# Patient Record
Sex: Female | Born: 1955
Health system: Southern US, Community
[De-identification: ages and names within clinical notes are randomized; demographics above are authoritative.]

## PROBLEM LIST (undated history)

## (undated) ENCOUNTER — Emergency Department (HOSPITAL_COMMUNITY): Admission: EM | Payer: 59

## (undated) DIAGNOSIS — I6381 Other cerebral infarction due to occlusion or stenosis of small artery: Secondary | ICD-10-CM

## (undated) DIAGNOSIS — I639 Cerebral infarction, unspecified: Secondary | ICD-10-CM

## (undated) DIAGNOSIS — M1990 Unspecified osteoarthritis, unspecified site: Secondary | ICD-10-CM

## (undated) DIAGNOSIS — N189 Chronic kidney disease, unspecified: Secondary | ICD-10-CM

## (undated) DIAGNOSIS — K219 Gastro-esophageal reflux disease without esophagitis: Secondary | ICD-10-CM

## (undated) DIAGNOSIS — R7989 Other specified abnormal findings of blood chemistry: Secondary | ICD-10-CM

## (undated) DIAGNOSIS — I10 Essential (primary) hypertension: Secondary | ICD-10-CM

## (undated) DIAGNOSIS — E7800 Pure hypercholesterolemia, unspecified: Secondary | ICD-10-CM

## (undated) DIAGNOSIS — M549 Dorsalgia, unspecified: Secondary | ICD-10-CM

## (undated) DIAGNOSIS — E039 Hypothyroidism, unspecified: Secondary | ICD-10-CM

## (undated) DIAGNOSIS — G8929 Other chronic pain: Secondary | ICD-10-CM

## (undated) HISTORY — PX: ABDOMINAL HYSTERECTOMY: SHX81

## (undated) HISTORY — PX: TONSILLECTOMY: SUR1361

## (undated) HISTORY — PX: JOINT REPLACEMENT: SHX530

## (undated) HISTORY — PX: APPENDECTOMY: SHX54

## (undated) HISTORY — PX: HERNIA REPAIR: SHX51

## (undated) HISTORY — PX: CARDIAC CATHETERIZATION: SHX172

---

## 1997-05-02 ENCOUNTER — Encounter: Admission: RE | Admit: 1997-05-02 | Discharge: 1997-05-02 | Payer: Self-pay | Admitting: Family Medicine

## 1997-05-03 ENCOUNTER — Encounter: Admission: RE | Admit: 1997-05-03 | Discharge: 1997-05-03 | Payer: Self-pay | Admitting: Family Medicine

## 1997-05-28 ENCOUNTER — Ambulatory Visit (HOSPITAL_COMMUNITY): Admission: RE | Admit: 1997-05-28 | Discharge: 1997-05-28 | Payer: Self-pay | Admitting: Interventional Cardiology

## 1997-06-07 ENCOUNTER — Encounter: Admission: RE | Admit: 1997-06-07 | Discharge: 1997-06-07 | Payer: Self-pay | Admitting: Family Medicine

## 1997-06-26 ENCOUNTER — Encounter: Admission: RE | Admit: 1997-06-26 | Discharge: 1997-06-26 | Payer: Self-pay | Admitting: Family Medicine

## 1997-08-08 ENCOUNTER — Encounter: Admission: RE | Admit: 1997-08-08 | Discharge: 1997-08-08 | Payer: Self-pay | Admitting: Family Medicine

## 1997-10-24 ENCOUNTER — Encounter: Payer: Self-pay | Admitting: Internal Medicine

## 1997-10-24 ENCOUNTER — Ambulatory Visit (HOSPITAL_COMMUNITY): Admission: RE | Admit: 1997-10-24 | Discharge: 1997-10-24 | Payer: Self-pay | Admitting: Internal Medicine

## 1998-01-29 ENCOUNTER — Encounter: Admission: RE | Admit: 1998-01-29 | Discharge: 1998-01-29 | Payer: Self-pay | Admitting: Sports Medicine

## 1998-02-07 ENCOUNTER — Encounter: Admission: RE | Admit: 1998-02-07 | Discharge: 1998-02-07 | Payer: Self-pay | Admitting: Family Medicine

## 1998-02-07 ENCOUNTER — Inpatient Hospital Stay (HOSPITAL_COMMUNITY): Admission: AD | Admit: 1998-02-07 | Discharge: 1998-02-11 | Payer: Self-pay | Admitting: *Deleted

## 1998-02-18 ENCOUNTER — Encounter: Admission: RE | Admit: 1998-02-18 | Discharge: 1998-02-18 | Payer: Self-pay | Admitting: Sports Medicine

## 1998-07-13 ENCOUNTER — Emergency Department (HOSPITAL_COMMUNITY): Admission: EM | Admit: 1998-07-13 | Discharge: 1998-07-13 | Payer: Self-pay | Admitting: Emergency Medicine

## 1999-05-06 ENCOUNTER — Ambulatory Visit (HOSPITAL_COMMUNITY): Admission: RE | Admit: 1999-05-06 | Discharge: 1999-05-06 | Payer: Self-pay

## 1999-06-29 ENCOUNTER — Emergency Department (HOSPITAL_COMMUNITY): Admission: EM | Admit: 1999-06-29 | Discharge: 1999-06-29 | Payer: Self-pay | Admitting: Emergency Medicine

## 1999-06-29 ENCOUNTER — Encounter: Payer: Self-pay | Admitting: Emergency Medicine

## 2001-06-21 ENCOUNTER — Encounter: Admission: RE | Admit: 2001-06-21 | Discharge: 2001-07-08 | Payer: Self-pay | Admitting: Occupational Medicine

## 2001-07-05 ENCOUNTER — Ambulatory Visit (HOSPITAL_COMMUNITY): Admission: RE | Admit: 2001-07-05 | Discharge: 2001-07-05 | Payer: Self-pay | Admitting: Family Medicine

## 2001-07-06 ENCOUNTER — Encounter: Admission: RE | Admit: 2001-07-06 | Discharge: 2001-07-06 | Payer: Self-pay | Admitting: Occupational Medicine

## 2001-07-06 ENCOUNTER — Encounter: Payer: Self-pay | Admitting: Occupational Medicine

## 2001-07-10 ENCOUNTER — Encounter: Payer: Self-pay | Admitting: Emergency Medicine

## 2001-07-10 ENCOUNTER — Emergency Department (HOSPITAL_COMMUNITY): Admission: EM | Admit: 2001-07-10 | Discharge: 2001-07-10 | Payer: Self-pay | Admitting: Emergency Medicine

## 2001-11-03 ENCOUNTER — Inpatient Hospital Stay (HOSPITAL_COMMUNITY): Admission: EM | Admit: 2001-11-03 | Discharge: 2001-11-04 | Payer: Self-pay | Admitting: Emergency Medicine

## 2001-11-03 ENCOUNTER — Encounter: Payer: Self-pay | Admitting: Emergency Medicine

## 2001-11-04 ENCOUNTER — Encounter: Payer: Self-pay | Admitting: Cardiology

## 2001-11-04 ENCOUNTER — Encounter: Payer: Self-pay | Admitting: Internal Medicine

## 2002-04-26 ENCOUNTER — Ambulatory Visit (HOSPITAL_COMMUNITY): Admission: RE | Admit: 2002-04-26 | Discharge: 2002-04-26 | Payer: Self-pay | Admitting: Internal Medicine

## 2002-04-26 ENCOUNTER — Encounter: Payer: Self-pay | Admitting: Internal Medicine

## 2002-09-03 ENCOUNTER — Emergency Department (HOSPITAL_COMMUNITY): Admission: EM | Admit: 2002-09-03 | Discharge: 2002-09-03 | Payer: Self-pay | Admitting: Emergency Medicine

## 2003-05-11 ENCOUNTER — Emergency Department (HOSPITAL_COMMUNITY): Admission: EM | Admit: 2003-05-11 | Discharge: 2003-05-11 | Payer: Self-pay | Admitting: Family Medicine

## 2003-05-16 ENCOUNTER — Emergency Department (HOSPITAL_COMMUNITY): Admission: EM | Admit: 2003-05-16 | Discharge: 2003-05-16 | Payer: Self-pay | Admitting: Family Medicine

## 2004-05-28 ENCOUNTER — Ambulatory Visit (HOSPITAL_COMMUNITY): Admission: RE | Admit: 2004-05-28 | Discharge: 2004-05-28 | Payer: Self-pay | Admitting: Internal Medicine

## 2004-08-12 ENCOUNTER — Other Ambulatory Visit: Admission: RE | Admit: 2004-08-12 | Discharge: 2004-08-12 | Payer: Self-pay | Admitting: Obstetrics and Gynecology

## 2004-10-02 ENCOUNTER — Ambulatory Visit (HOSPITAL_COMMUNITY): Admission: RE | Admit: 2004-10-02 | Discharge: 2004-10-02 | Payer: Self-pay | Admitting: Obstetrics and Gynecology

## 2004-10-02 ENCOUNTER — Encounter (INDEPENDENT_AMBULATORY_CARE_PROVIDER_SITE_OTHER): Payer: Self-pay | Admitting: Specialist

## 2004-10-08 ENCOUNTER — Ambulatory Visit (HOSPITAL_COMMUNITY): Admission: RE | Admit: 2004-10-08 | Discharge: 2004-10-08 | Payer: Self-pay | Admitting: Cardiology

## 2005-10-04 ENCOUNTER — Emergency Department (HOSPITAL_COMMUNITY): Admission: EM | Admit: 2005-10-04 | Discharge: 2005-10-04 | Payer: Self-pay | Admitting: Family Medicine

## 2006-07-12 ENCOUNTER — Inpatient Hospital Stay (HOSPITAL_COMMUNITY): Admission: RE | Admit: 2006-07-12 | Discharge: 2006-07-14 | Payer: Self-pay | Admitting: Obstetrics and Gynecology

## 2006-07-12 ENCOUNTER — Encounter (INDEPENDENT_AMBULATORY_CARE_PROVIDER_SITE_OTHER): Payer: Self-pay | Admitting: Obstetrics and Gynecology

## 2006-10-18 ENCOUNTER — Emergency Department (HOSPITAL_COMMUNITY): Admission: EM | Admit: 2006-10-18 | Discharge: 2006-10-18 | Payer: Self-pay | Admitting: Family Medicine

## 2007-12-06 ENCOUNTER — Emergency Department (HOSPITAL_COMMUNITY): Admission: EM | Admit: 2007-12-06 | Discharge: 2007-12-06 | Payer: Self-pay | Admitting: Emergency Medicine

## 2009-05-08 ENCOUNTER — Encounter: Admission: RE | Admit: 2009-05-08 | Discharge: 2009-06-07 | Payer: Self-pay | Admitting: Internal Medicine

## 2009-06-14 ENCOUNTER — Inpatient Hospital Stay (HOSPITAL_COMMUNITY): Admission: EM | Admit: 2009-06-14 | Discharge: 2009-06-15 | Payer: Self-pay | Admitting: Emergency Medicine

## 2009-06-14 ENCOUNTER — Ambulatory Visit: Payer: Self-pay | Admitting: Cardiovascular Disease

## 2009-06-14 ENCOUNTER — Encounter (INDEPENDENT_AMBULATORY_CARE_PROVIDER_SITE_OTHER): Payer: Self-pay | Admitting: Internal Medicine

## 2009-06-14 ENCOUNTER — Ambulatory Visit: Payer: Self-pay | Admitting: Vascular Surgery

## 2009-06-18 ENCOUNTER — Telehealth: Payer: Self-pay | Admitting: Cardiology

## 2009-07-17 ENCOUNTER — Ambulatory Visit: Payer: Self-pay | Admitting: Cardiology

## 2009-07-17 ENCOUNTER — Telehealth (INDEPENDENT_AMBULATORY_CARE_PROVIDER_SITE_OTHER): Payer: Self-pay | Admitting: *Deleted

## 2009-07-17 DIAGNOSIS — E785 Hyperlipidemia, unspecified: Secondary | ICD-10-CM | POA: Insufficient documentation

## 2009-07-17 DIAGNOSIS — R609 Edema, unspecified: Secondary | ICD-10-CM | POA: Insufficient documentation

## 2009-07-17 DIAGNOSIS — R55 Syncope and collapse: Secondary | ICD-10-CM | POA: Insufficient documentation

## 2009-07-17 DIAGNOSIS — I10 Essential (primary) hypertension: Secondary | ICD-10-CM | POA: Insufficient documentation

## 2010-02-20 NOTE — Progress Notes (Signed)
  Pt left FMLA papers to be completed, sent to [facility]  July 17, 2009 3:05 PM

## 2010-02-20 NOTE — Progress Notes (Signed)
Summary: stress echo  Phone Note Call from Patient Call back at Home Phone [phone]   Caller: Patient Reason for Call: Talk to Nurse Summary of Call: per After Hrs Msg pt needs to have Stress Echo, I do not see anything in DC Instructions, pt states she has Echo in the hospital, does she still need Stress Echo, please advise.... pt does have appt w/ Dr Juanda Chance on 6/28 Initial call taken by: Migdalia Dk,  Jun 18, 2009 4:07 PM  Follow-up for Phone Call        spoke with PA [facility], no stress echo needed, just follow up with Dr. Juanda Chance, pt aware, Migdalia Dk  Jun 18, 2009 4:23 PM

## 2010-02-20 NOTE — Assessment & Plan Note (Signed)
Summary: eph   Visit Type:  Initial Consult EPH Primary Provider:  Andi Devon  CC:  Pt was taken off of atacand/hctz  and she has had some edema.  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 Her blood pressure and pulse rate were both quite high at that time. We saw her in consultation and thought most of her problems related to hypertension. She had an echocardiogram which was normal with no LVH and normal LV function. She had serial enzymes which were negative.  Since discharge she says she's done well as had no recurrent symptoms. She says her blood pressures run in the range of 140/80-90.  Her other main symptom is related to edema of the lower extremities which is a chronic problem. She was on hydrochlorothiazide before hospital admission but this was not continued after discharge.  She does have a family history of coronary disease with a father who died of an MI at age 58. She also has hyperlipidemia.  Current Medications (verified): 1)  Diovan 40 Mg Tabs (Valsartan) .... Take One Tablet By Mouth Daily 2)  Aspirin 81 Mg Tbec (Aspirin) .... Take One Tablet By Mouth Daily 3)  Fish Oil .... 2 Tabs Once Daily 4)  Prilosec 20 Mg .Marland Kitchen.. 1 Tab Two Times A Day 5)  Vitamin D .... 1 Tab Once Daily  Allergies (verified): 1)  ! * Lisinopril  Past History:  Past Medical History: Reviewed history from 07/17/2009 and no changes required. 1. Hypertension.   2. Atypical chest pain.       a.     False positive Cardiolite status post catheterization in        2006.  Ejection fraction was normal and normal coronary artery.       b.     Echocardiogram in 2003 with normal ejection fraction, no        wall motion abnormalities.   3. Hyperlipidemia.   4. Uterine fibroids.   5. Endometrial polyps  status post total hysterectomy in 2008.   6. Gastroesophageal reflux disease.   7. Obesity.   8. Internal hemorrhoids.   9. Chronic back pain.   Review of Systems       ROS is negative except as outlined in HPI.   Vital Signs:  Patient profile:   55 year old female Height:      67 inches Weight:      266 pounds BMI:     41.81 Pulse rate:   89 / minute BP sitting:   142 / 86  (left arm) Cuff size:   large  Vitals Entered By: Burnett Kanaris, CNA (July 17, 2009 10:29 AM)  Physical Exam  Additional Exam:  Gen. Well-nourished, in no distress   Neck: No JVD, thyroid not enlarged, no carotid bruits Lungs: No tachypnea, clear without rales, rhonchi or wheezes Cardiovascular: Rhythm regular, PMI not displaced,  heart sounds  normal, grade 2/6 systolic ejection murmur at the left sternal edge, 1+ bilateral peripheral edema, pulses normal in all 4 extremities. Abdomen: BS normal, abdomen soft and non-tender without masses or organomegaly, no hepatosplenomegaly. MS: No deformities, no cyanosis or clubbing   Neuro:  No focal sns   Skin:  no lesions    Impression & Recommendations:  Problem # 1:  SYNCOPE AND COLLAPSE (ICD-780.2) She had  a presyncopal episode while in the hospital which we think was probably related to her hypertension. She has had no recurrence and I don't think any further workup is needed for this. Her updated medication list for this problem includes:    Aspirin 81 Mg Tbec (Aspirin) .Marland Kitchen... Take one tablet by mouth daily  Her updated medication list for this problem includes:    Aspirin 81 Mg Tbec (Aspirin) .Marland Kitchen... Take one tablet by mouth daily  Problem # 2:  HYPERTENSION, BENIGN (ICD-401.1)  Her updated medication list for this problem includes:    Diovan 40 Mg Tabs (Valsartan) .Marland Kitchen... Take one tablet by mouth daily    Aspirin 81 Mg Tbec (Aspirin) .Marland Kitchen... Take one tablet by mouth daily    Hydrochlorothiazide 12.5 Mg Tabs (Hydrochlorothiazide) .Marland Kitchen... Take one tablet by  mouth daily.  Her updated medication list for this problem includes:    Diovan 40 Mg Tabs (Valsartan) .Marland Kitchen... Take one tablet by mouth daily    Aspirin 81 Mg Tbec (Aspirin) .Marland Kitchen... Take one tablet by mouth daily  Problem # 3:  EDEMA (ICD-782.3) She has lower extremity edema which I think is related to venous insufficiency. She already keeps her feet elevated and she does use support hose at work. We will resume the hydrochlorothiazide to see if this will help. Dr. Bernette Mayers can make further adjustment on followup visit.  Problem # 4:  HYPERLIPIDEMIA-MIXED (ICD-272.4) She has a history of hyperlipidemia but is on no medical treatment for this. She does have a moderately high-risk profile for vascular disease with a positive family history of hypertension. She plans to get her lipid profile checked with Dr. Renae Gloss in the future and they can decide about further management.  Patient Instructions: 1)  Your physician has recommended you make the following change in your medication: 1) Start Hydrochlorothiazide (HCTZ) 12.5mg  once daily. 2)  We will see you back on an as needed basis. Prescriptions: DIOVAN 40 MG TABS (VALSARTAN) Take one tablet by mouth daily  #90 x 3   Entered by:   Sherri Rad, RN, BSN   Authorized by:   Lenoria Farrier, MD, [facility]   Signed by:   Sherri Rad, RN, BSN on 07/17/2009   Method used:   Faxed to ...       [facility] (mail-order)             , [place]         Ph: [phone]       Fax: [phone]   RxID:   [id number] HYDROCHLOROTHIAZIDE 12.5 MG TABS (HYDROCHLOROTHIAZIDE) Take one tablet by mouth daily.  #90 x 3   Entered by:   Sherri Rad, RN, BSN   Authorized by:   Lenoria Farrier, MD, [facility]   Signed by:   Sherri Rad, RN, BSN on 07/17/2009   Method used:   Faxed to ...       [facility] (mail-order)             , [place]         Ph: [phone]       Fax: [phone]   RxID:   [id number]

## 2010-04-07 LAB — DIFFERENTIAL
Basophils Absolute: 0 10*3/uL (ref 0.0–0.1)
Eosinophils Relative: 3 % (ref 0–5)
Lymphocytes Relative: 33 % (ref 12–46)
Lymphs Abs: 2.9 10*3/uL (ref 0.7–4.0)
Monocytes Absolute: 0.4 10*3/uL (ref 0.1–1.0)
Neutrophils Relative %: 59 % (ref 43–77)
Neutrophils Relative %: 65 % (ref 43–77)

## 2010-04-07 LAB — URINALYSIS, ROUTINE W REFLEX MICROSCOPIC
Glucose, UA: NEGATIVE mg/dL
Hgb urine dipstick: NEGATIVE
Protein, ur: NEGATIVE mg/dL
pH: 6.5 (ref 5.0–8.0)

## 2010-04-07 LAB — BASIC METABOLIC PANEL
BUN: 12 mg/dL (ref 6–23)
BUN: 15 mg/dL (ref 6–23)
Calcium: 9.3 mg/dL (ref 8.4–10.5)
Chloride: 102 mEq/L (ref 96–112)
Chloride: 102 mEq/L (ref 96–112)
Creatinine, Ser: 0.78 mg/dL (ref 0.4–1.2)
Creatinine, Ser: 1.02 mg/dL (ref 0.4–1.2)
GFR calc Af Amer: >60 mL/min (ref >60)
GFR calc non Af Amer: 57 mL/min — ABNORMAL LOW (ref >60)
GFR calc non Af Amer: >60 mL/min (ref >60)
Glucose, Bld: 93 mg/dL (ref 70–99)
Potassium: 3.5 mEq/L (ref 3.5–5.1)
Potassium: 3.8 mEq/L (ref 3.5–5.1)
Sodium: 136 mEq/L (ref 135–145)
Sodium: 137 mEq/L (ref 135–145)

## 2010-04-07 LAB — CK TOTAL AND CKMB (NOT AT ARMC)
CK, MB: 2.9 ng/mL (ref 0.3–4.0)
CK, MB: 3.1 ng/mL (ref 0.3–4.0)
Relative Index: 1.2 (ref 0.0–2.5)
Relative Index: 1.3 (ref 0.0–2.5)
Total CK: 225 U/L — ABNORMAL HIGH (ref 7–177)

## 2010-04-07 LAB — BRAIN NATRIURETIC PEPTIDE: Pro B Natriuretic peptide (BNP): <30 pg/mL (ref 0.0–100.0)

## 2010-04-07 LAB — CBC
Hemoglobin: 13.2 g/dL (ref 12.0–15.0)
Hemoglobin: 13.4 g/dL (ref 12.0–15.0)
MCV: 91.3 fL (ref 78.0–100.0)
RDW: 12.4 % (ref 11.5–15.5)

## 2010-04-07 LAB — POCT CARDIAC MARKERS: CKMB, poc: 2.1 ng/mL (ref 1.0–8.0)

## 2010-04-07 LAB — TROPONIN I
Troponin I: 0.01 ng/mL (ref 0.00–0.06)
Troponin I: 0.03 ng/mL (ref 0.00–0.06)
Troponin I: <0.01 ng/mL (ref 0.00–0.06)

## 2010-05-16 ENCOUNTER — Other Ambulatory Visit (HOSPITAL_COMMUNITY): Payer: Self-pay | Admitting: Internal Medicine

## 2010-05-16 DIAGNOSIS — R200 Anesthesia of skin: Secondary | ICD-10-CM

## 2010-05-16 DIAGNOSIS — M545 Low back pain, unspecified: Secondary | ICD-10-CM

## 2010-05-21 ENCOUNTER — Ambulatory Visit (HOSPITAL_COMMUNITY)
Admission: RE | Admit: 2010-05-21 | Discharge: 2010-05-21 | Disposition: A | Discharge status: Home / Self Care | Payer: 59 | Source: Ambulatory Visit | Attending: Internal Medicine | Admitting: Internal Medicine

## 2010-05-21 DIAGNOSIS — M25559 Pain in unspecified hip: Secondary | ICD-10-CM | POA: Insufficient documentation

## 2010-05-21 DIAGNOSIS — R200 Anesthesia of skin: Secondary | ICD-10-CM

## 2010-05-21 DIAGNOSIS — M47817 Spondylosis without myelopathy or radiculopathy, lumbosacral region: Secondary | ICD-10-CM | POA: Insufficient documentation

## 2010-05-21 DIAGNOSIS — M545 Low back pain, unspecified: Secondary | ICD-10-CM

## 2010-05-21 DIAGNOSIS — M519 Unspecified thoracic, thoracolumbar and lumbosacral intervertebral disc disorder: Secondary | ICD-10-CM | POA: Insufficient documentation

## 2010-05-21 DIAGNOSIS — R209 Unspecified disturbances of skin sensation: Secondary | ICD-10-CM | POA: Insufficient documentation

## 2010-05-21 DIAGNOSIS — M5126 Other intervertebral disc displacement, lumbar region: Secondary | ICD-10-CM | POA: Insufficient documentation

## 2010-06-06 NOTE — Op Note (Signed)
Theresa Nolan, Theresa Nolan              ACCOUNT NO.:  [id number]   MEDICAL RECORD NO.:  [id number]          PATIENT TYPE:  INP   LOCATION:  9308                          [facility]ACILITY:  [facility]   PHYSICIAN:  Maxie Better, M.D.DATE OF BIRTH:  01/12/1956   DATE OF PROCEDURE:  07/12/2006  DATE OF DISCHARGE:                               OPERATIVE REPORT   PREOPERATIVE DIAGNOSIS:  Menorrhagia, uterine fibroids.   PROCEDURES:  Diagnostic laparoscopy, exploratory laparotomy, total  abdominal hysterectomy.   POSTOPERATIVE DIAGNOSIS:  Menorrhagia, uterine fibroids.   ANESTHESIA:  General.   SURGEON:  Maxie Better, M.D.   ASSISTANT:  Genia Del, M.D.   PROCEDURE:  Under adequate general anesthesia the patient was examined  under anesthesia and was found to have a mobile irregular 10-week size  uterus. Decision was then made to proceed with laparoscopic vaginal  hysterectomy.  The patient was then sterilely prepped and draped in  usual fashion.  Indwelling Foley catheter was sterilely placed.  A  bivalve speculum was placed in the vagina.  Single-tooth tenaculum  placed on the anterior lip of the cervix.  Acorn cannula was introduced  in cervical os and attached to tenaculum for manipulation of the uterus.  The bivalve speculum then removed.  Attention was then turned to the  abdomen.  Quarter percent Marcaine was injected along the previous  vertical subumbilical incision.  The incision was then made.  Veress  needle was introduced.  Placement tested. Subsequently the opening  pressure of 8 was noted.  3 liters of CO2 was insufflated.  Veress  needle was then removed.  A disposable 10 mm trocar was introduced into  the abdomen without incident. A lighted video laparoscope was placed  through that port.  At that point the inspection of the pelvis was  notable for both round ligaments had been suspended to the anterior  fascia bilaterally. There was some omental adhesion the  anterior  abdominal wall to the left of the midline.  The uterus was enlarged  bigger than what was anticipated by bimanual examination with a  pedunculated right fibroid. The decision was then made based on the  findings to proceed with exploratory laparotomy. At that point the  abdomen was deflated, Pfannenstiel skin incision was then made, carried  down to the rectus fascia.  Rectus fascia was opened transversely.  The  rectus fascia was then bluntly and sharply dissected off the rectus  muscles in superior and inferior fashion.  The parietal peritoneum was  then opened sharply at which time the omentum was encountered and part  of that omental adhesions were then lysed.  Attention was then turned to  the uterus where the uterus was exteriorized with a pedunculated right  fundal fibroid.  Left ovary had a small cyst.  The right ovary was  otherwise normal.  There is evidence of separation of the tubes  bilaterally consistent with previous tubal ligation and the round  ligament was attached to the anterior abdominal wall bilaterally.  The  round ligament adhesion was then lysed.  The self-retaining Balfour  retractor was then placed. The round ligaments  were then severed  bilaterally.  The anterior leaf of the broad ligament was opened  anteriorly.  The posterior leaf of the broad ligament opened  bilaterally.  The utero-ovarian ligaments bilaterally then doubly  clamped, cut and suture ligated and free tied with 0 Vicryl bilaterally.  The uterine vessels were then bilaterally skeletonized.  They were then  bilaterally clamped, cut and suture ligated with 0 Vicryl.  The cardinal  ligaments were serially clamped, cut and suture ligated until the  uterosacral ligaments was reached at which time they were individually  clamped, cut bilaterally and suture ligated. When angle of the vaginal  cuff was reached on the left there was evidence of the vagina opened on  left side. There was still a  large bulbous cervix with a question of a  cervical fibroid and/or Nabothian's cyst that was noted and splayed  resulting in further dissection on the right being performed with the  serially clamped, cut until the cervicovaginal junction was felt to have  been reached at which time the cervix was then severed from its vaginal  attachment. At that point the vaginal cuff was then oversewn  circumferential with 0 Vicryl running lock stitch and the vaginal cuff  was then closed in a vertical fashion using interrupted 0 Vicryl  sutures. Bleeding was noted on the right aspect inferiorly of the  pedicles and a 2-0 single suture was then placed and hemostasis noted.  The bleeding appeared to be more venous in nature. Small bleeders  otherwise were cauterized.  The bladder had been bluntly dissected  throughout the whole time. The abdomen was then copiously irrigated and  suctioned of debris.  Good hemostasis subsequently noted.  The vaginal  angle was suspended to the uterosacral ligaments bilaterally.  The round  ligaments was used to suspend the ovaries bilaterally. The omentum was  inspected.  There was a diffuse extension of the omentum anteriorly and  therefore that was not separated. The defect in the omentum was looked  for. One opening was noted and that was separated to prevent or reduce  the risk of obstruction. The abdomen was then copiously irrigated. All  instruments then removed. The parietal peritoneum was not closed.  The  rectus fascia was closed with 0 Vicryl x2.  The subcutaneous irrigated,  small bleeders cauterized.  Interrupted 2-0 plain sutures then placed.  The skin was approximated with Ethicon staples.  Specimen was uterus  with cervix sent to pathology.  Estimated blood loss was 400 mL.  Intraoperative fluid was at 1800 mL. Urine output was 725 mL clear  yellow urine.  The ureters were palpated deep in the pelvis were normal. Sponge and instrument counts x2 was correct.   Complications none.  The  patient tolerated the procedure well and was transferred to recovery in  stable condition.      Maxie Better, M.D.  Electronically Signed     [place]/MEDQ  D:  07/12/2006  T:  07/12/2006  Job:  [id number]

## 2010-06-06 NOTE — Discharge Summary (Signed)
NAME:  Theresa Nolan, Theresa Nolan                        ACCOUNT NO.:  [id number]   MEDICAL RECORD NO.:  [id number]                   PATIENT TYPE:  INP   LOCATION:  4711                                 FACILITY:  [facility]   PHYSICIAN:  Alvira Philips, M.D.                DATE OF BIRTH:  1955/09/12   DATE OF ADMISSION:  11/03/2001  DATE OF DISCHARGE:  11/04/2001                                 DISCHARGE SUMMARY   PHYSICIANS:  Alvira Philips, M.D.-Intern, Blanch Media, M.D., Tollie Eth, M.D.-Primary Care at [facility].   DISCHARGE MEDICATIONS:  1. Hydrochlorothiazide 25 1 p.o. daily.  2. Avapro 150 mg 1 tablet p.o. daily.  3. Pravachol 40 mg 1 tablet p.o. daily.   ACTIVITY/DIET/WOUND CARE:  No activity restrictions, dietary retrictions, no  wound care applications.  The patient will followup with Jillyn Hidden A. Rankin,  M.D. at [facility] in approximately one week.  The patient is to call for  appointment.   PROCEDURES:  Cardiolite which showed no evidence of myocardial ischemia or  proximal wall motion EF of 66%.   BRIEF ADMISSION AND PHYSICAL:  This is a 55 year old African American female  with history of hypertension, hypercholesterolemia, who presented to the ED  with complaints of dizziness and chest pain since date of admission.  The  patient described the chest pain as something sitting on my chest, without  radiation.  She started feeling dizziness the morning prior to admission.  Ate breakfast but still felt dizzy when she went to work at [employer].  She also noted feeling nauseated but showed a questionable  draining abscess in her mouth, maxillary sinus in her left upper jaw.  She  had been having jaw and mouth pain and swelling in the maxillary sinus  region x1 week.  The patient went to the dentist for evaluation with left  upper wisdom tooth pulled one day prior to admission with local anesthesia  and no antibiotics were given.  Abscess was still draining  into her mouth,  throat and nose at this time.  The patient had emesis episode x1 last night,  looked green.  This morning, the patient noted chest pain and dizziness also  associated with a feeling of hot and flush.  She denies any fever, chills,  abdominal pain, headaches or dysuria.  The patient took 500 mg of OxyContin  after waiting in the dentist office, but nothing this morning for pain.  For  full H&P, please see H&P in the chart.  The patient was admitted with the  following diagnoses:   ADMISSION DIAGNOSES:  1. Chest pain, for cardiac source.  2. Syncope.  3. Hypertension.  4. Hyperlipidemia.  5. Tobacco use.  6. Mouth abscess.  7. Obesity.   HOSPITAL COURSE:  1. Chest pain.  The patient had EKG in the ER which showed no signs of     ischemia, normal sinus rhythm at  91 beats/min.  Cardiac enzymes were     cyled and negative x3.  The patient had the following identifiable risk     factors for coronary artery disease including hypertension,     hyperlipidemia, smoking history and obesity.  The patient had treadmill     Cardiolite which revealed some dyspnea and no EKG changes.  Bruce II     protocol was used.  Certain mets were achieved.  2-D echo was also     performed which showed EF of 66%.  Echo showed also no signs of ischemia     with EF in the range of 60-65%.  Echo showed ventricular ejection     fraction of 67 to 70%.  No LV wall abnormalities.  Left atrial size was     at the level of normal.  No other abnormalities noted.  The patient was     started on aspirin 325 mg daily and pain was felt to be noncardiac in     origin.  2. Hypertension.  The patient was continued on outpatient medications with     hydrochlorothiazide 25 mg and Avapro 150 mg daily.  The patients blood     pressure at the time of admission was 159/95.  The patients blood     pressure was normalized during hospital course and the time of admission,     the patients blood pressure was running  118 to 130/72 to 90.  The patient     was also started on metoprolol 12.5 mg p.o. b.i.d. as well as aspirin.     No further therapy noted at this time.  3. Draining maxillary abscess.  The patient being followed by Duffy Rhody L.     Freida Busman, D.D.S. for extraction and abscess.  The patient not currently     taking any antibiotics.  The patient was maintained on pain control with     Tylenol 650 mg q.4h. p.r.n.  4. Patient with history of hyperlipidemia.  The patient was started on Zocor     10 mg q.h.s.  LFTs were checked for baseline and LFTs were noted to be     AST of 18, ALT 14.  A lipid profile was noted to be cholesterol of 216,     triglycerides 202, HDL 38 and LDL 138.  5. History of depression.  The patient not complaining of any active     complaints and the patient was asymptomatic.  No further therapy was     noted, was addressed at the time of admission.   DISCHARGE CONDITION:  The patient was discharged on 11/04/01 in stable and  improved condition after being ruled out for acute myocardial infarction.   The patient will followup with [facility] in approximately one week.  The  patient will call for appointment.                                                Alvira Philips, M.D.    RM/MEDQ  D:  03/06/2002  T:  03/07/2002  Job:  [id number]

## 2010-06-06 NOTE — Op Note (Signed)
NAMETOY, SAMARIN              ACCOUNT NO.:  [id number]   MEDICAL RECORD NO.:  [id number]          PATIENT TYPE:  AMB   LOCATION:  SDC                           FACILITY:  [facility]   PHYSICIAN:  Michelle L. Grewal, M.D.DATE OF BIRTH:  [birth year]   DATE OF PROCEDURE:  10/02/2004  DATE OF DISCHARGE:                                 OPERATIVE REPORT   PREOPERATIVE DIAGNOSIS:  Menorrhagia.   POSTOPERATIVE DIAGNOSES:  1.  Menorrhagia.  2.  Fibroids.  3.  Endometrial polyp.   PROCEDURE:  Dilatation and curettage, hysteroscopy.   SURGEON:  Michelle L. Vincente Poli, M.D.   ANESTHESIA:  MAC with local.   SPECIMENS:  Uterine curettings sent.   ESTIMATED BLOOD LOSS:  Minimal.   COMPLICATIONS:  None.   PROCEDURES:  The patient was taken to the operating room.  She was given  anesthesia without difficulty.  She was prepped and draped in the usual  sterile fashion.  An in-and-out catheter was used to empty the bladder.  A  speculum was inserted into the vagina and the cervix was grasped with a  tenaculum, and the uterus was sounded and noted to be 14 cm.  The diagnostic  hysteroscope was inserted into the uterine cavity and a large endometrial  polyp was with pain.  The hysteroscope was removed and the uterus was  thoroughly curetted of all tissue, and a polypoid structure was noted in the  curettings.  We then inserted the ThermaChoice III in order to do the  endometrial ablation, and I tried to two times try to get to optimal  pressure but I was unable to do an ablation because I could not get the  pressure over 150.  This could either have been due to an inadvertent  uterine perforation, which I do not think given that the uterus was such a  large uterus with a large cavity and the balloon would not conform to the  cavity well.  So the endometrial ablation was not performed.  At the end of  the procedure all sponge, lap and instrument counts were correct x2.  All  tissue was sent to  pathology for analysis.  The patient went to the recovery  room in stable condition.      Michelle L. Vincente Poli, M.D.  Electronically Signed     [facility]/MEDQ  D:  10/02/2004  T:  10/02/2004  Job:  [id number]

## 2010-06-06 NOTE — Cardiovascular Report (Signed)
Theresa Nolan, ADE NO.:  [id number]   MEDICAL RECORD NO.:  [id number]          PATIENT TYPE:  OIB   LOCATION:  2852                         FACILITY:  [facility]   PHYSICIAN:  Cristy Hilts. Jacinto Halim, MD       DATE OF BIRTH:  1955-12-26   DATE OF PROCEDURE:  10/08/2004  DATE OF DISCHARGE:  10/08/2004                              CARDIAC CATHETERIZATION   PROCEDURE PERFORMED:  1.  Left ventriculography.  2.  Selective left coronary angiography.  3.  Abdominal aortogram.  4.  Right femoral angiography and closure of the right femoral artery access      using Starclose.   INDICATIONS FOR PROCEDURE:  Ms. Corry Storie is a 55 year old female with  a history of obesity, hypertension, hyperlipidemia, who had been complaining  of increasing exertional chest pain and shortness of breath.  During this,  she underwent Cardiolite stress test which revealed possible anterior wall  ischemia both in short axis and vertical axis views.  Given abnormal  Cardiolite stress test and patient preference, it was decided to proceed  with cardiac catheterization for definitive diagnosis of coronary artery  disease.  Abdominal aortogram was performed to rule out renal artery  stenosis due to hypertension.   HEMODYNAMIC DATA:  Left ventricular  pressure 139/5 with end diastolic  pressure of 11 mmHg.  The aortic pressure was 136/74 with a mean of 101  mmHg.  There was no pressure gradient across the aortic valve.   ANGIOGRAPHIC DATA:  Left ventricle:  The left ventricular systolic function was normal with  ejection fraction of 60%.  There was no significant mitral regurgitation.   Right coronary artery:  The right coronary artery is a large dominant  vessel, it gives origin to an aberrant circumflex.  However, both circumflex  and RCA are normal.   Left main:  The left main is basically LAD and ramus intermediate  bifurcation.  It is normal.   Ramus intermedius:  Moderate to large caliber  vessel and is normal.   Left anterior descending:  The LAD is a large caliber vessel.  It gives  origin to a large diagonal and small diagonal two.  It ends at the apex.  It  is normal.   Abdominal aortogram:  Abdominal aortogram reveals two renal arteries, the  aortoiliac bifurcation was widely patent.  There was no evidence of renal  artery stenosis.   IMPRESSION:  1.  Normal LV systolic function, ejection fraction 60%, no significant      mitral regurgitation.  2.  Normal coronary arteries, aberrant origin of circumflex, however, this      is normal.  3.  False positive Cardiolite stress test.   RECOMMENDATIONS:  Evaluation for non-cardiac chest pain is indicated.  The  patient will be treated for GERD.  Weight loss and prevention strategies are  indicated with control of hypertension and hyperlipidemia.   TECHNIQUES OF PROCEDURE:  In the usual sterile technique, using right  femoral artery access, a 6 [place] multipurpose catheter was utilized to  engage the RCA and also the LAD and angiography was performed.  Left  ventriculography was also performed in the RAO and LAO projection.  The  catheter was pulled back in the abdominal aorta and abdominal aortogram was  performed.  The catheter was pulled out of the body and right femoral  angiography was performed through the arterial access sheath and the access  was closed with Starclose with excellent hemostasis obtained.  The patient  tolerated the procedure well.  A total of 110 mL of contrast was utilized  for diagnostic angiography.  No immediate complications were noted.      Cristy Hilts. Jacinto Halim, MD  Electronically Signed     [facility]/MEDQ  D:  10/08/2004  T:  10/08/2004  Job:  [id number]   cc:   Merlene Laughter. Renae Gloss, M.D.  Fax: [phone]

## 2010-06-06 NOTE — Discharge Summary (Signed)
Theresa Nolan, Theresa Nolan              ACCOUNT NO.:  [id number]   MEDICAL RECORD NO.:  [id number]          PATIENT TYPE:  INP   LOCATION:  9308                          FACILITY:  [facility]   PHYSICIAN:  Maxie Better, M.D.DATE OF BIRTH:  Nov 22, 1955   DATE OF ADMISSION:  07/12/2006  DATE OF DISCHARGE:  07/14/2006                               DISCHARGE SUMMARY   ADMISSION DIAGNOSES:  1. Menorrhagia.  2. Uterine fibroids.   DISCHARGE DIAGNOSES:  1. Menorrhagia.  2. Uterine fibroids.  3. Adenomyosis   PROCEDURES:  Diagnostic laparoscopy, exploratory laparotomy, total  abdominal hysterectomy.   HISTORY OF PRESENT ILLNESS:  A 55 year old para-2 widowed black female  with a history of tubal ligation with symptomatic uterine fibroids  presented for surgical management.   HOSPITAL COURSE:  The patient was admitted to [facility].  She  underwent a diagnostic laparoscopy;  however, during the procedure, the  finding of a significantly enlarged uterus resulted in an exploratory  laparotomy and total abdominal hysterectomy.  Please see the dictated  operative note for the details.  The final pathology revealed a uterus  that was 396 gm and multiple fibroids, adenomyosis, benign cervix and no  hyperplasia or carcinoma.  Postoperatively, the patient did well.  Her  CBC on postop day #1 showed a hemoglobin of 11.4, hematocrit 33.9, white  count 12.3, platelet count 270,000.  By postop day #2, after Dulcolax  suppository, a GI cocktail and ambulation, the patient was able to pass  flatus.  She had remained afebrile throughout her hospital course and  was deemed well to be discharged home.  Her incision had no erythema,  induration or exudate and her staples were in place due to the fact that  she was postop day #2.   DISPOSITION:  Home.   CONDITION ON DISCHARGE:  Stable.   DISCHARGE MEDICATIONS:  1. Tylox one to two tablets every 3-4 hours p.r.n. pain.  2. Motrin 800 mg one p.o.  q.6-8h. p.r.n. pain.   DISCHARGE FOLLOWUP:  Followup appointment at [facility] in 6 weeks  and for staple removal in the office on the upcoming Monday.   DISCHARGE INSTRUCTIONS:  Call for temperature greater than or equal to  100.4.  Nothing per vagina for 4-6 weeks.  No heavy lifting or driving  for 2 weeks.  Call for severe abdominal pain, nausea, vomiting,  incisional pain, redness or drainage from the incision site.  No  straining with a bowel movement.      Maxie Better, M.D.  Electronically Signed     [place]/[facility]  D:  08/18/2006  T:  08/19/2006  Job:  [id number]

## 2010-06-13 ENCOUNTER — Other Ambulatory Visit (HOSPITAL_COMMUNITY): Payer: Self-pay | Admitting: Internal Medicine

## 2010-06-13 DIAGNOSIS — Z1231 Encounter for screening mammogram for malignant neoplasm of breast: Secondary | ICD-10-CM

## 2010-06-25 ENCOUNTER — Ambulatory Visit (HOSPITAL_COMMUNITY): Payer: 59

## 2010-07-17 ENCOUNTER — Ambulatory Visit (HOSPITAL_COMMUNITY)
Admission: RE | Admit: 2010-07-17 | Discharge: 2010-07-17 | Disposition: A | Discharge status: Home / Self Care | Payer: 59 | Source: Ambulatory Visit | Attending: Internal Medicine | Admitting: Internal Medicine

## 2010-07-17 DIAGNOSIS — Z1231 Encounter for screening mammogram for malignant neoplasm of breast: Secondary | ICD-10-CM | POA: Insufficient documentation

## 2010-07-25 ENCOUNTER — Ambulatory Visit (HOSPITAL_COMMUNITY)
Admission: RE | Admit: 2010-07-25 | Discharge: 2010-07-25 | Disposition: A | Discharge status: Home / Self Care | Payer: 59 | Source: Ambulatory Visit | Attending: Gastroenterology | Admitting: Gastroenterology

## 2010-07-25 ENCOUNTER — Other Ambulatory Visit: Payer: Self-pay | Admitting: Gastroenterology

## 2010-07-25 DIAGNOSIS — M129 Arthropathy, unspecified: Secondary | ICD-10-CM | POA: Insufficient documentation

## 2010-07-25 DIAGNOSIS — K219 Gastro-esophageal reflux disease without esophagitis: Secondary | ICD-10-CM | POA: Insufficient documentation

## 2010-07-25 DIAGNOSIS — D126 Benign neoplasm of colon, unspecified: Secondary | ICD-10-CM | POA: Insufficient documentation

## 2010-07-25 DIAGNOSIS — Z1211 Encounter for screening for malignant neoplasm of colon: Secondary | ICD-10-CM | POA: Insufficient documentation

## 2010-07-25 DIAGNOSIS — K648 Other hemorrhoids: Secondary | ICD-10-CM | POA: Insufficient documentation

## 2010-07-25 DIAGNOSIS — I10 Essential (primary) hypertension: Secondary | ICD-10-CM | POA: Insufficient documentation

## 2010-10-21 LAB — URINALYSIS, ROUTINE W REFLEX MICROSCOPIC
Glucose, UA: NEGATIVE
Hgb urine dipstick: NEGATIVE
Ketones, ur: NEGATIVE
Protein, ur: NEGATIVE
pH: 6.5

## 2010-11-05 LAB — CBC
HCT: 40
Hemoglobin: 13.2
MCHC: 33.5
MCV: 87.6
MCV: 87.7
Platelets: 270
Platelets: 328
RBC: 3.87
RDW: 14.6 — ABNORMAL HIGH
WBC: 8.8

## 2010-11-05 LAB — BASIC METABOLIC PANEL
BUN: 12
BUN: 4 — ABNORMAL LOW
CO2: 31
Calcium: 8.5
Chloride: 101
Chloride: 102
Creatinine, Ser: 0.67
Creatinine, Ser: 0.73
Glucose, Bld: 102 — ABNORMAL HIGH
Glucose, Bld: 105 — ABNORMAL HIGH
Potassium: 3.6

## 2011-07-09 ENCOUNTER — Other Ambulatory Visit (HOSPITAL_COMMUNITY): Payer: Self-pay | Admitting: Internal Medicine

## 2011-07-09 DIAGNOSIS — Z1231 Encounter for screening mammogram for malignant neoplasm of breast: Secondary | ICD-10-CM

## 2011-08-04 ENCOUNTER — Ambulatory Visit (HOSPITAL_COMMUNITY): Payer: 59

## 2011-08-19 ENCOUNTER — Ambulatory Visit (HOSPITAL_COMMUNITY)
Admission: RE | Admit: 2011-08-19 | Discharge: 2011-08-19 | Disposition: A | Discharge status: Home / Self Care | Payer: 59 | Source: Ambulatory Visit | Attending: Internal Medicine | Admitting: Internal Medicine

## 2011-08-19 DIAGNOSIS — Z1231 Encounter for screening mammogram for malignant neoplasm of breast: Secondary | ICD-10-CM | POA: Insufficient documentation

## 2011-10-15 ENCOUNTER — Emergency Department (HOSPITAL_COMMUNITY)
Admission: EM | Admit: 2011-10-15 | Discharge: 2011-10-15 | Disposition: A | Discharge status: Home / Self Care | Payer: 59 | Attending: Emergency Medicine | Admitting: Emergency Medicine

## 2011-10-15 ENCOUNTER — Emergency Department (HOSPITAL_COMMUNITY): Payer: 59

## 2011-10-15 ENCOUNTER — Encounter (HOSPITAL_COMMUNITY): Payer: Self-pay

## 2011-10-15 DIAGNOSIS — E875 Hyperkalemia: Secondary | ICD-10-CM | POA: Insufficient documentation

## 2011-10-15 DIAGNOSIS — I10 Essential (primary) hypertension: Secondary | ICD-10-CM | POA: Insufficient documentation

## 2011-10-15 DIAGNOSIS — E7800 Pure hypercholesterolemia, unspecified: Secondary | ICD-10-CM | POA: Insufficient documentation

## 2011-10-15 DIAGNOSIS — R002 Palpitations: Secondary | ICD-10-CM | POA: Insufficient documentation

## 2011-10-15 HISTORY — DX: Essential (primary) hypertension: I10

## 2011-10-15 HISTORY — DX: Other cerebral infarction due to occlusion or stenosis of small artery: I63.81

## 2011-10-15 HISTORY — DX: Pure hypercholesterolemia, unspecified: E78.00

## 2011-10-15 HISTORY — DX: Unspecified osteoarthritis, unspecified site: M19.90

## 2011-10-15 HISTORY — DX: Other specified abnormal findings of blood chemistry: R79.89

## 2011-10-15 LAB — BASIC METABOLIC PANEL
Chloride: 100 mEq/L (ref 96–112)
Creatinine, Ser: 0.68 mg/dL (ref 0.50–1.10)
GFR calc Af Amer: >90 mL/min (ref >90)
Sodium: 137 mEq/L (ref 135–145)

## 2011-10-15 LAB — CBC
MCV: 89.6 fL (ref 78.0–100.0)
Platelets: 277 10*3/uL (ref 150–400)
RBC: 4.23 MIL/uL (ref 3.87–5.11)
RDW: 13.1 % (ref 11.5–15.5)
WBC: 9.1 10*3/uL (ref 4.0–10.5)

## 2011-10-15 LAB — PRO B NATRIURETIC PEPTIDE: Pro B Natriuretic peptide (BNP): 25.9 pg/mL (ref 0–125)

## 2011-10-15 MED ORDER — ASPIRIN 81 MG PO CHEW
324.0000 mg | CHEWABLE_TABLET | Freq: Once | ORAL | Status: AC
  Administered 2011-10-15: 324 mg via ORAL
  Filled 2011-10-15: qty 4

## 2011-10-15 MED ORDER — SODIUM POLYSTYRENE SULFONATE 15 GM/60ML PO SUSP
15.0000 g | Freq: Once | ORAL | Status: AC
  Administered 2011-10-15: 15 g via ORAL
  Filled 2011-10-15: qty 60

## 2011-10-15 MED ORDER — METOPROLOL TARTRATE 1 MG/ML IV SOLN
2.5000 mg | Freq: Once | INTRAVENOUS | Status: AC
  Administered 2011-10-15: 2.5 mg via INTRAVENOUS
  Filled 2011-10-15: qty 5

## 2011-10-15 NOTE — ED Notes (Signed)
Troponin results  cTnl  0.00 ng/mL 

## 2011-10-15 NOTE — ED Notes (Signed)
Patient transported to X-ray 

## 2011-10-15 NOTE — ED Notes (Signed)
Was dx with increased TSH by PCP and was placed on Synthroid 25 mcg about 3 weeks ago.

## 2011-10-15 NOTE — ED Provider Notes (Addendum)
History     CSN: [id number]  Arrival date & time 10/15/11  0865   First MD Initiated Contact with Patient 10/15/11 [phone]      Chief Complaint  Patient presents with  . Palpitations    (Consider location/radiation/quality/duration/timing/severity/associated sxs/prior treatment) Patient is a 56 y.o. female presenting with palpitations. The history is provided by the patient.  Palpitations  This is a new problem. The current episode started yesterday. The problem occurs constantly. The problem has not changed since onset.Associated with: recently started on thyroid medication. Associated symptoms include chest pressure and irregular heartbeat. She has tried nothing for the symptoms.    Past Medical History  Diagnosis Date  . Hypertension   . TSH elevation   . Hypercholesteremia   . Lacunar infarction   . Degenerative joint disease     Past Surgical History  Procedure Date  . Abdominal hysterectomy     ovaries remain     No family history on file.  History  Substance Use Topics  . Smoking status: Never Smoker   . Smokeless tobacco: Never Used  . Alcohol Use: 0.6 oz/week    1 Glasses of wine per week     occasionally     OB History    Grav Para Term Preterm Abortions TAB SAB Ect Mult Living                  Review of Systems  Cardiovascular: Positive for palpitations.  All other systems reviewed and are negative.    Allergies  Lisinopril  Home Medications  No current outpatient prescriptions on file.  BP 165/96  Pulse 80  Temp 97.8 F (36.6 C) (Oral)  Resp 15  SpO2 100%  Physical Exam  Constitutional: She is oriented to person, place, and time. She appears well-developed and well-nourished.  HENT:  Head: Normocephalic and atraumatic.  Eyes: Conjunctivae normal and EOM are normal. Pupils are equal, round, and reactive to light.  Neck: Normal range of motion.  Cardiovascular: Normal rate, regular rhythm and normal heart sounds.   Pulmonary/Chest:  Effort normal and breath sounds normal.  Abdominal: Soft. Bowel sounds are normal.  Musculoskeletal: Normal range of motion.  Neurological: She is alert and oriented to person, place, and time.  Skin: Skin is warm and dry.  Psychiatric: She has a normal mood and affect. Her behavior is normal.    ED Course  Procedures (including critical care time)   Labs Reviewed  CBC  BASIC METABOLIC PANEL  PRO B NATRIURETIC PEPTIDE   No results found.   No diagnosis found.   Date: 10/15/2011  Rate: 82  Rhythm: normal sinus rhythm  QRS Axis: normal  Intervals: normal  ST/T Wave abnormalities: normal  Conduction Disutrbances: none  Narrative Interpretation: unremarkable     MDM  + palpitations.  No chest pain.  Will check ce, electrolytes,  reassess    Improved.  Slightly increase k+.  Will have fu with pmd for recheck..  Will dc to fu outpt    Alegra Rost Lytle Michaels, MD 10/15/11 0400  Mayelin Panos Lytle Michaels, MD 10/15/11 9629  Doak Mah Lytle Michaels, MD 10/15/11 [phone]

## 2011-10-15 NOTE — ED Notes (Signed)
Patient is a Charity fundraiser. Yesterday was awakened "feeling like my heart was going to jump out of my chest." pt used her stethoscope to listen to her chest and states that "it sounded like it was skipping beats." had a "pounding" sensation at the left breast region. Non-radiating. Denies n/v, diaphoresis, abd pain. Endorses some sob and "feelings of warmth." patient was working Quarry manager. Was sitting down at the computer and began to experience palpitations and chest "pounding". Checked BP, which was "up a little bit" 158/93. Hx htn. On meds. Felt "warmth" and some sob. Denies any feet/leg edema.

## 2012-05-17 ENCOUNTER — Encounter (HOSPITAL_COMMUNITY): Payer: Self-pay | Admitting: Emergency Medicine

## 2012-05-17 ENCOUNTER — Encounter (HOSPITAL_COMMUNITY): Payer: Self-pay | Admitting: *Deleted

## 2012-05-17 ENCOUNTER — Emergency Department (HOSPITAL_COMMUNITY)
Admission: EM | Admit: 2012-05-17 | Discharge: 2012-05-17 | Disposition: A | Discharge status: Nursing Home (Includes ICF) | Payer: 59 | Source: Home / Self Care

## 2012-05-17 ENCOUNTER — Emergency Department (HOSPITAL_COMMUNITY)
Admission: EM | Admit: 2012-05-17 | Discharge: 2012-05-17 | Disposition: A | Discharge status: Home / Self Care | Payer: 59 | Attending: Emergency Medicine | Admitting: Emergency Medicine

## 2012-05-17 DIAGNOSIS — Z79899 Other long term (current) drug therapy: Secondary | ICD-10-CM | POA: Insufficient documentation

## 2012-05-17 DIAGNOSIS — I10 Essential (primary) hypertension: Secondary | ICD-10-CM | POA: Insufficient documentation

## 2012-05-17 DIAGNOSIS — G8929 Other chronic pain: Secondary | ICD-10-CM | POA: Insufficient documentation

## 2012-05-17 DIAGNOSIS — M25511 Pain in right shoulder: Secondary | ICD-10-CM

## 2012-05-17 DIAGNOSIS — M549 Dorsalgia, unspecified: Secondary | ICD-10-CM | POA: Insufficient documentation

## 2012-05-17 DIAGNOSIS — R21 Rash and other nonspecific skin eruption: Secondary | ICD-10-CM

## 2012-05-17 DIAGNOSIS — M2550 Pain in unspecified joint: Secondary | ICD-10-CM | POA: Insufficient documentation

## 2012-05-17 DIAGNOSIS — R7989 Other specified abnormal findings of blood chemistry: Secondary | ICD-10-CM | POA: Insufficient documentation

## 2012-05-17 DIAGNOSIS — Z8673 Personal history of transient ischemic attack (TIA), and cerebral infarction without residual deficits: Secondary | ICD-10-CM | POA: Insufficient documentation

## 2012-05-17 DIAGNOSIS — M546 Pain in thoracic spine: Secondary | ICD-10-CM

## 2012-05-17 DIAGNOSIS — Z7982 Long term (current) use of aspirin: Secondary | ICD-10-CM | POA: Insufficient documentation

## 2012-05-17 DIAGNOSIS — Z888 Allergy status to other drugs, medicaments and biological substances status: Secondary | ICD-10-CM | POA: Insufficient documentation

## 2012-05-17 DIAGNOSIS — M1990 Unspecified osteoarthritis, unspecified site: Secondary | ICD-10-CM | POA: Insufficient documentation

## 2012-05-17 DIAGNOSIS — M25512 Pain in left shoulder: Secondary | ICD-10-CM

## 2012-05-17 DIAGNOSIS — M25519 Pain in unspecified shoulder: Secondary | ICD-10-CM | POA: Insufficient documentation

## 2012-05-17 DIAGNOSIS — E7800 Pure hypercholesterolemia, unspecified: Secondary | ICD-10-CM | POA: Insufficient documentation

## 2012-05-17 HISTORY — DX: Other chronic pain: G89.29

## 2012-05-17 HISTORY — DX: Dorsalgia, unspecified: M54.9

## 2012-05-17 LAB — COMPREHENSIVE METABOLIC PANEL
ALT: 19 U/L (ref 0–35)
Alkaline Phosphatase: 79 U/L (ref 39–117)
BUN: 14 mg/dL (ref 6–23)
CO2: 27 mEq/L (ref 19–32)
Calcium: 9.3 mg/dL (ref 8.4–10.5)
GFR calc Af Amer: >90 mL/min (ref >90)
GFR calc non Af Amer: >90 mL/min (ref >90)
Glucose, Bld: 80 mg/dL (ref 70–99)
Sodium: 136 mEq/L (ref 135–145)
Total Protein: 7.6 g/dL (ref 6.0–8.3)

## 2012-05-17 LAB — URINALYSIS, ROUTINE W REFLEX MICROSCOPIC
Bilirubin Urine: NEGATIVE
Hgb urine dipstick: NEGATIVE
Nitrite: NEGATIVE
Protein, ur: NEGATIVE mg/dL
Urobilinogen, UA: 0.2 mg/dL (ref 0.0–1.0)

## 2012-05-17 LAB — CBC WITH DIFFERENTIAL/PLATELET
Eosinophils Absolute: 0.5 10*3/uL (ref 0.0–0.7)
Eosinophils Relative: 5 % (ref 0–5)
HCT: 36.8 % (ref 36.0–46.0)
Hemoglobin: 12.9 g/dL (ref 12.0–15.0)
Lymphocytes Relative: 21 % (ref 12–46)
Lymphs Abs: 2.3 10*3/uL (ref 0.7–4.0)
MCH: 30.4 pg (ref 26.0–34.0)
MCV: 86.8 fL (ref 78.0–100.0)
Monocytes Relative: 4 % (ref 3–12)
Platelets: 304 10*3/uL (ref 150–400)
RBC: 4.24 MIL/uL (ref 3.87–5.11)
WBC: 10.8 10*3/uL — ABNORMAL HIGH (ref 4.0–10.5)

## 2012-05-17 LAB — SEDIMENTATION RATE: Sed Rate: 22 mm/hr (ref 0–22)

## 2012-05-17 MED ORDER — PREDNISONE 20 MG PO TABS: ORAL_TABLET | ORAL | Status: DC

## 2012-05-17 MED ORDER — PREDNISONE 20 MG PO TABS
60.0000 mg | ORAL_TABLET | Freq: Once | ORAL | Status: AC
  Administered 2012-05-17: 60 mg via ORAL
  Filled 2012-05-17: qty 3

## 2012-05-17 MED ORDER — POTASSIUM CHLORIDE CRYS ER 20 MEQ PO TBCR
40.0000 meq | EXTENDED_RELEASE_TABLET | Freq: Once | ORAL | Status: AC
  Administered 2012-05-17: 40 meq via ORAL
  Filled 2012-05-17: qty 2

## 2012-05-17 NOTE — ED Provider Notes (Signed)
History     CSN: [id number]  Arrival date & time 05/17/12  1121   First MD Initiated Contact with Patient 05/17/12 1341      Chief Complaint  Patient presents with  . Rash  . Shoulder Pain    (Consider location/radiation/quality/duration/timing/severity/associated sxs/prior treatment) HPI Comments: Patient presents with complaint of bilateral shoulder pain that is worse with movement starting approximately one week ago without any injuries. Patient states that the pain began in her right shoulder and moved to her left. She complains of the worst pain when she raises her arms above her shoulder level. Yesterday she began to have an itchy rash develop on her abdomen which has spread to the hips, buttocks, upper legs. No treatments prior to arrival. No airway involvement or angioedema. No new medications, foods, or skin exposures. No fever, nausea, or vomiting. Patient was seen at urgent care and sent to the ED for further evaluation. Patient is a Engineer, civil (consulting) and is concerned that she has rheumatoid arthritis or another autoimmune process. Onset of symptoms acute. Course is gradually worsening. Nothing makes symptoms better.  The history is provided by the patient and a relative.    Past Medical History  Diagnosis Date  . Hypertension   . TSH elevation   . Hypercholesteremia   . Lacunar infarction   . Degenerative joint disease   . Chronic back pain     Past Surgical History  Procedure Laterality Date  . Abdominal hysterectomy      ovaries remain     History reviewed. No pertinent family history.  History  Substance Use Topics  . Smoking status: Never Smoker   . Smokeless tobacco: Never Used  . Alcohol Use: 0.6 oz/week    1 Glasses of wine per week     Comment: occasionally     OB History   Grav Para Term Preterm Abortions TAB SAB Ect Mult Living                  Review of Systems  Constitutional: Negative for fever.  HENT: Negative for sore throat, facial swelling,  rhinorrhea and trouble swallowing.   Eyes: Negative for redness.  Respiratory: Negative for cough, shortness of breath, wheezing and stridor.   Cardiovascular: Negative for chest pain.  Gastrointestinal: Negative for nausea, vomiting, abdominal pain and diarrhea.  Genitourinary: Negative for dysuria.  Musculoskeletal: Positive for arthralgias. Negative for myalgias and joint swelling.  Skin: Positive for rash. Negative for color change.  Neurological: Negative for light-headedness and headaches.  Psychiatric/Behavioral: Negative for confusion.    Allergies  Lisinopril  Home Medications   Current Outpatient Rx  Name  Route  Sig  Dispense  Refill  . amLODipine-valsartan (EXFORGE) 10-160 MG per tablet   Oral   Take 1 tablet by mouth daily.         Marland Kitchen ascorbic acid (VITAMIN C) 1000 MG tablet   Oral   Take 2,000 mg by mouth daily.         Marland Kitchen aspirin EC 81 MG tablet   Oral   Take 81 mg by mouth daily.         . carvedilol (COREG) 12.5 MG tablet   Oral   Take 12.5 mg by mouth 2 (two) times daily with a meal.         . cholecalciferol (VITAMIN D) 1000 UNITS tablet   Oral   Take 1,000 Units by mouth daily.         Marland Kitchen ezetimibe-simvastatin (VYTORIN) 10-20  MG per tablet   Oral   Take 1 tablet by mouth at bedtime.         . hydrochlorothiazide (HYDRODIURIL) 25 MG tablet   Oral   Take 25 mg by mouth daily.         Marland Kitchen levothyroxine (SYNTHROID, LEVOTHROID) 25 MCG tablet   Oral   Take 25 mcg by mouth daily.         . Naproxen Sodium (ALEVE PO)   Oral   Take 1-2 tablets by mouth every 12 (twelve) hours as needed. Headache or pain         . Omega-3 Fatty Acids (FISH OIL) 1000 MG CAPS   Oral   Take 1 capsule by mouth daily.         Marland Kitchen omeprazole (PRILOSEC OTC) 20 MG tablet   Oral   Take 20 mg by mouth 2 (two) times daily.         . traMADol (ULTRAM) 50 MG tablet   Oral   Take 50 mg by mouth 2 (two) times daily as needed. pain           BP 141/76   Pulse 75  Temp(Src) 97.9 F (36.6 C) (Oral)  Resp 20  SpO2 99%  Physical Exam  Nursing note and vitals reviewed. Constitutional: She appears well-developed and well-nourished.  HENT:  Head: Normocephalic and atraumatic.  No angioedema.  Eyes: Conjunctivae are normal. Right eye exhibits no discharge. Left eye exhibits no discharge.  No conjunctival involvement.  Neck: Normal range of motion. Neck supple.  Cardiovascular: Normal rate, regular rhythm and normal heart sounds.   Pulmonary/Chest: Effort normal and breath sounds normal.  Abdominal: Soft. There is no tenderness.  Musculoskeletal: Normal range of motion. She exhibits no edema and no tenderness.       Right shoulder: She exhibits tenderness (pain with movement) and pain. She exhibits normal range of motion, no swelling and normal pulse.       Left shoulder: She exhibits tenderness (Pain with movement) and pain. She exhibits normal range of motion, no swelling and normal pulse.  Neurological: She is alert.  Skin: Skin is warm and dry. Rash noted. There is erythema.  Maculopapular rash involving abdomen and lower chest, legs, to a lesser extent her back.  Psychiatric: She has a normal mood and affect.    ED Course  Procedures (including critical care time)  Labs Reviewed  CBC WITH DIFFERENTIAL - Abnormal; Notable for the following:    WBC 10.8 (*)    All other components within normal limits  COMPREHENSIVE METABOLIC PANEL - Abnormal; Notable for the following:    Potassium 2.9 (*)    All other components within normal limits  URINALYSIS, ROUTINE W REFLEX MICROSCOPIC  SEDIMENTATION RATE   No results found.   1. Shoulder pain, left   2. Shoulder pain, right   3. Rash     2:21 PM Patient seen and examined. Work-up initiated.   Vital signs reviewed and are as follows: Filed Vitals:   05/17/12 1135  BP: 141/76  Pulse: 75  Temp: 97.9 F (36.6 C)  Resp: 20   4:35 PM Patient informed of results to this point.  She is requesting d/c prior to ESR returning. Urged pt to have physician follow-up on this result.   Patient urged to return with worsening symptoms or other concerns. Patient verbalized understanding and agrees with plan.     MDM  Polyarthralgias - arthritis, osteo vs autoimmune? No concern for  septic arthritis. Continue home pain medications and anti-inflammatories.   Rash - unclear etiology. Appears allergic. Prednisone.         Renne Crigler, PA-C 05/17/12 1637

## 2012-05-17 NOTE — ED Notes (Signed)
Pt  Reports       Rash      And  Back      Pain  X  Several  Days      Pt  Has  A  History  Of   djd  And       hypertension  Pt  Ambulated  To  Room  With a  Steady  Fluid  Gait

## 2012-05-17 NOTE — ED Notes (Addendum)
Pt c/o bilateral shoulder pain worse with movement x 1 week; pt sts generalized itchy rash to stomach x several days that is spreading; pt sent here from [facility] for further eval

## 2012-05-17 NOTE — ED Provider Notes (Signed)
History     CSN: [id number]  Arrival date & time 05/17/12  1018   None     Chief Complaint  Patient presents with  . Rash    (Consider location/radiation/quality/duration/timing/severity/associated sxs/prior treatment) Patient is a 57 y.o. female presenting with rash. The history is provided by the patient and a relative.  Rash Location:  Full body Quality: itchiness   Onset quality:  Sudden Duration:  2 days Timing:  Constant Progression:  Spreading Chronicity:  New Worsened by:  Nothing tried Associated symptoms: joint pain and myalgias   Associated symptoms: no fever, no nausea, no shortness of breath and not vomiting     Past Medical History  Diagnosis Date  . Hypertension   . TSH elevation   . Hypercholesteremia   . Lacunar infarction   . Degenerative joint disease   . Chronic back pain     Past Surgical History  Procedure Laterality Date  . Abdominal hysterectomy      ovaries remain     No family history on file.  History  Substance Use Topics  . Smoking status: Never Smoker   . Smokeless tobacco: Never Used  . Alcohol Use: 0.6 oz/week    1 Glasses of wine per week     Comment: occasionally     OB History   Grav Para Term Preterm Abortions TAB SAB Ect Mult Living                  Review of Systems  Constitutional: Negative for fever.  Respiratory: Negative for shortness of breath.   Gastrointestinal: Negative for nausea and vomiting.  Musculoskeletal: Positive for myalgias and arthralgias.  Skin: Positive for rash.    Allergies  Lisinopril  Home Medications   Current Outpatient Rx  Name  Route  Sig  Dispense  Refill  . amLODipine-valsartan (EXFORGE) 10-160 MG per tablet   Oral   Take 1 tablet by mouth daily.         Marland Kitchen ascorbic acid (VITAMIN C) 1000 MG tablet   Oral   Take 2,000 mg by mouth daily.         Marland Kitchen aspirin EC 81 MG tablet   Oral   Take 81 mg by mouth daily.         . carvedilol (COREG) 12.5 MG tablet    Oral   Take 12.5 mg by mouth 2 (two) times daily with a meal.         . cholecalciferol (VITAMIN D) 1000 UNITS tablet   Oral   Take 1,000 Units by mouth daily.         Marland Kitchen ezetimibe-simvastatin (VYTORIN) 10-20 MG per tablet   Oral   Take 1 tablet by mouth at bedtime.         . hydrochlorothiazide (HYDRODIURIL) 25 MG tablet   Oral   Take 25 mg by mouth daily.         Marland Kitchen levothyroxine (SYNTHROID, LEVOTHROID) 25 MCG tablet   Oral   Take 25 mcg by mouth daily.         . Naproxen Sodium (ALEVE PO)   Oral   Take 1-2 tablets by mouth every 12 (twelve) hours as needed. Headache or pain         . Omega-3 Fatty Acids (FISH OIL) 1000 MG CAPS   Oral   Take 1 capsule by mouth daily.         Marland Kitchen omeprazole (PRILOSEC OTC) 20 MG tablet   Oral  Take 20 mg by mouth 2 (two) times daily.         . predniSONE (DELTASONE) 20 MG tablet      3 Tabs PO Days 1-3, then 2 tabs PO Days 4-6, then 1 tab PO Day 7-9, then Half Tab PO Day 10-12   20 tablet   0   . traMADol (ULTRAM) 50 MG tablet   Oral   Take 50 mg by mouth 2 (two) times daily as needed. pain           BP 145/89  Pulse 80  Temp(Src) 97.6 F (36.4 C) (Oral)  Resp 16  SpO2 9%  Physical Exam  Nursing note and vitals reviewed. Constitutional: She appears well-developed and well-nourished.  Pulmonary/Chest: Breath sounds normal.  Abdominal: Soft. Bowel sounds are normal.  Skin: Skin is warm and dry. Rash noted. Rash is macular. There is erythema.       ED Course  Procedures (including critical care time)  Labs Reviewed - No data to display No results found.   1. Rash of entire body   2. Back pain, thoracic       MDM  Sent for eval of rash x 2 days and shoulder pain for 1 week        Linna Hoff, MD 05/17/12 2012

## 2012-05-18 NOTE — ED Provider Notes (Signed)
Medical screening examination/treatment/procedure(s) were performed by non-physician practitioner and as supervising physician I was immediately available for consultation/collaboration.   Guilherme Schwenke E Tyreak Reagle, MD 05/18/12 0734 

## 2012-10-03 ENCOUNTER — Other Ambulatory Visit (HOSPITAL_COMMUNITY): Payer: Self-pay | Admitting: Internal Medicine

## 2012-10-03 DIAGNOSIS — Z1231 Encounter for screening mammogram for malignant neoplasm of breast: Secondary | ICD-10-CM

## 2012-10-05 ENCOUNTER — Ambulatory Visit (HOSPITAL_COMMUNITY)
Admission: RE | Admit: 2012-10-05 | Discharge: 2012-10-05 | Disposition: A | Discharge status: Home / Self Care | Payer: 59 | Source: Ambulatory Visit | Attending: Internal Medicine | Admitting: Internal Medicine

## 2012-10-05 DIAGNOSIS — Z1231 Encounter for screening mammogram for malignant neoplasm of breast: Secondary | ICD-10-CM | POA: Insufficient documentation

## 2012-10-06 ENCOUNTER — Other Ambulatory Visit (HOSPITAL_COMMUNITY): Payer: Self-pay | Admitting: Orthopedic Surgery

## 2012-10-06 DIAGNOSIS — M545 Low back pain, unspecified: Secondary | ICD-10-CM

## 2012-10-12 ENCOUNTER — Ambulatory Visit (HOSPITAL_COMMUNITY): Payer: 59

## 2012-10-14 ENCOUNTER — Ambulatory Visit (HOSPITAL_COMMUNITY)
Admission: RE | Admit: 2012-10-14 | Discharge: 2012-10-14 | Disposition: A | Discharge status: Home / Self Care | Payer: 59 | Source: Ambulatory Visit | Attending: Orthopedic Surgery | Admitting: Orthopedic Surgery

## 2012-10-14 DIAGNOSIS — M5126 Other intervertebral disc displacement, lumbar region: Secondary | ICD-10-CM | POA: Insufficient documentation

## 2012-10-14 DIAGNOSIS — M5124 Other intervertebral disc displacement, thoracic region: Secondary | ICD-10-CM | POA: Insufficient documentation

## 2012-10-14 DIAGNOSIS — M545 Low back pain, unspecified: Secondary | ICD-10-CM | POA: Insufficient documentation

## 2012-10-14 DIAGNOSIS — D1779 Benign lipomatous neoplasm of other sites: Secondary | ICD-10-CM | POA: Insufficient documentation

## 2012-10-14 DIAGNOSIS — R29898 Other symptoms and signs involving the musculoskeletal system: Secondary | ICD-10-CM | POA: Insufficient documentation

## 2012-10-14 DIAGNOSIS — R209 Unspecified disturbances of skin sensation: Secondary | ICD-10-CM | POA: Insufficient documentation

## 2012-10-14 DIAGNOSIS — M25559 Pain in unspecified hip: Secondary | ICD-10-CM | POA: Insufficient documentation

## 2013-12-05 ENCOUNTER — Other Ambulatory Visit (HOSPITAL_COMMUNITY): Payer: Self-pay | Admitting: Family Medicine

## 2013-12-05 DIAGNOSIS — Z1231 Encounter for screening mammogram for malignant neoplasm of breast: Secondary | ICD-10-CM

## 2013-12-11 ENCOUNTER — Ambulatory Visit (HOSPITAL_COMMUNITY)
Admission: RE | Admit: 2013-12-11 | Discharge: 2013-12-11 | Disposition: A | Discharge status: Home / Self Care | Payer: 59 | Source: Ambulatory Visit | Attending: Family Medicine | Admitting: Family Medicine

## 2013-12-11 DIAGNOSIS — Z1231 Encounter for screening mammogram for malignant neoplasm of breast: Secondary | ICD-10-CM | POA: Insufficient documentation

## 2014-05-23 ENCOUNTER — Emergency Department (HOSPITAL_COMMUNITY): Payer: 59

## 2014-05-23 ENCOUNTER — Observation Stay (HOSPITAL_COMMUNITY)
Admission: EM | Admit: 2014-05-23 | Discharge: 2014-05-25 | Disposition: A | Discharge status: Home / Self Care | Payer: 59 | Attending: Surgery | Admitting: Surgery

## 2014-05-23 ENCOUNTER — Encounter (HOSPITAL_COMMUNITY): Payer: Self-pay | Admitting: *Deleted

## 2014-05-23 DIAGNOSIS — K810 Acute cholecystitis: Secondary | ICD-10-CM | POA: Diagnosis present

## 2014-05-23 DIAGNOSIS — M1990 Unspecified osteoarthritis, unspecified site: Secondary | ICD-10-CM | POA: Diagnosis not present

## 2014-05-23 DIAGNOSIS — K8010 Calculus of gallbladder with chronic cholecystitis without obstruction: Principal | ICD-10-CM | POA: Insufficient documentation

## 2014-05-23 DIAGNOSIS — I10 Essential (primary) hypertension: Secondary | ICD-10-CM | POA: Insufficient documentation

## 2014-05-23 DIAGNOSIS — K819 Cholecystitis, unspecified: Secondary | ICD-10-CM | POA: Diagnosis present

## 2014-05-23 DIAGNOSIS — E780 Pure hypercholesterolemia: Secondary | ICD-10-CM | POA: Diagnosis not present

## 2014-05-23 DIAGNOSIS — Z90710 Acquired absence of both cervix and uterus: Secondary | ICD-10-CM | POA: Diagnosis not present

## 2014-05-23 DIAGNOSIS — Z7982 Long term (current) use of aspirin: Secondary | ICD-10-CM | POA: Insufficient documentation

## 2014-05-23 DIAGNOSIS — Z888 Allergy status to other drugs, medicaments and biological substances status: Secondary | ICD-10-CM | POA: Insufficient documentation

## 2014-05-23 LAB — CBC
HEMATOCRIT: 38.2 % (ref 36.0–46.0)
HEMOGLOBIN: 13 g/dL (ref 12.0–15.0)
MCH: 30 pg (ref 26.0–34.0)
MCHC: 34 g/dL (ref 30.0–36.0)
MCV: 88.2 fL (ref 78.0–100.0)
Platelets: 337 10*3/uL (ref 150–400)
RBC: 4.33 MIL/uL (ref 3.87–5.11)
RDW: 12.8 % (ref 11.5–15.5)
WBC: 16.4 10*3/uL — ABNORMAL HIGH (ref 4.0–10.5)

## 2014-05-23 LAB — COMPREHENSIVE METABOLIC PANEL
ALBUMIN: 3.7 g/dL (ref 3.5–5.0)
ALT: 17 U/L (ref 14–54)
ANION GAP: 11 (ref 5–15)
AST: 17 U/L (ref 15–41)
Alkaline Phosphatase: 85 U/L (ref 38–126)
BILIRUBIN TOTAL: 0.8 mg/dL (ref 0.3–1.2)
BUN: 12 mg/dL (ref 6–20)
CALCIUM: 9.2 mg/dL (ref 8.9–10.3)
CHLORIDE: 103 mmol/L (ref 101–111)
CO2: 25 mmol/L (ref 22–32)
CREATININE: 0.67 mg/dL (ref 0.44–1.00)
GFR calc Af Amer: >60 mL/min (ref >60)
GFR calc non Af Amer: >60 mL/min (ref >60)
Glucose, Bld: 116 mg/dL — ABNORMAL HIGH (ref 70–99)
Potassium: 3.6 mmol/L (ref 3.5–5.1)
Sodium: 139 mmol/L (ref 135–145)
TOTAL PROTEIN: 7.6 g/dL (ref 6.5–8.1)

## 2014-05-23 LAB — I-STAT TROPONIN, ED: TROPONIN I, POC: 0 ng/mL (ref 0.00–0.08)

## 2014-05-23 LAB — LIPASE, BLOOD: LIPASE: 22 U/L (ref 22–51)

## 2014-05-23 LAB — BRAIN NATRIURETIC PEPTIDE: B NATRIURETIC PEPTIDE 5: 79.5 pg/mL (ref 0.0–100.0)

## 2014-05-23 MED ORDER — ONDANSETRON 4 MG PO TBDP
4.0000 mg | ORAL_TABLET | Freq: Once | ORAL | Status: AC
  Administered 2014-05-23: 4 mg via ORAL
  Filled 2014-05-23: qty 1

## 2014-05-23 MED ORDER — PIPERACILLIN-TAZOBACTAM 3.375 G IVPB
3.3750 g | Freq: Three times a day (TID) | INTRAVENOUS | Status: DC
  Administered 2014-05-24 – 2014-05-25 (×4): 3.375 g via INTRAVENOUS
  Filled 2014-05-23 (×8): qty 50

## 2014-05-23 MED ORDER — ENOXAPARIN SODIUM 40 MG/0.4ML ~~LOC~~ SOLN
40.0000 mg | SUBCUTANEOUS | Status: DC
  Filled 2014-05-23: qty 0.4

## 2014-05-23 MED ORDER — PIPERACILLIN-TAZOBACTAM 3.375 G IVPB: 3.3750 g | Freq: Four times a day (QID) | INTRAVENOUS | Status: DC

## 2014-05-23 MED ORDER — LEVOTHYROXINE SODIUM 25 MCG PO TABS
25.0000 ug | ORAL_TABLET | Freq: Every day | ORAL | Status: DC
  Administered 2014-05-24 – 2014-05-25 (×2): 25 ug via ORAL
  Filled 2014-05-23 (×2): qty 1

## 2014-05-23 MED ORDER — AMLODIPINE BESYLATE-VALSARTAN 10-160 MG PO TABS: 1.0000 | ORAL_TABLET | Freq: Every day | ORAL | Status: DC

## 2014-05-23 MED ORDER — HYDROMORPHONE HCL 1 MG/ML IJ SOLN
1.0000 mg | INTRAMUSCULAR | Status: DC | PRN
  Administered 2014-05-23 – 2014-05-24 (×6): 1 mg via INTRAVENOUS
  Filled 2014-05-23 (×6): qty 1

## 2014-05-23 MED ORDER — PIPERACILLIN-TAZOBACTAM 3.375 G IVPB 30 MIN
3.3750 g | Freq: Once | INTRAVENOUS | Status: AC
Start: 2014-05-23 — End: 2014-05-23
  Administered 2014-05-23: 3.375 g via INTRAVENOUS
  Filled 2014-05-23: qty 50

## 2014-05-23 MED ORDER — AMLODIPINE BESYLATE 10 MG PO TABS
10.0000 mg | ORAL_TABLET | Freq: Every day | ORAL | Status: DC
  Administered 2014-05-23 – 2014-05-25 (×3): 10 mg via ORAL
  Filled 2014-05-23 (×3): qty 1

## 2014-05-23 MED ORDER — MORPHINE SULFATE 4 MG/ML IJ SOLN
4.0000 mg | Freq: Once | INTRAMUSCULAR | Status: AC
Start: 2014-05-23 — End: 2014-05-23
  Administered 2014-05-23: 4 mg via INTRAMUSCULAR
  Filled 2014-05-23: qty 1

## 2014-05-23 MED ORDER — POTASSIUM CHLORIDE IN NACL 20-0.9 MEQ/L-% IV SOLN
INTRAVENOUS | Status: DC
  Administered 2014-05-23 – 2014-05-25 (×3): via INTRAVENOUS
  Filled 2014-05-23 (×6): qty 1000

## 2014-05-23 MED ORDER — ONDANSETRON HCL 4 MG/2ML IJ SOLN
4.0000 mg | Freq: Four times a day (QID) | INTRAMUSCULAR | Status: DC | PRN
Start: 2014-05-23 — End: 2014-05-25
  Administered 2014-05-23 – 2014-05-25 (×4): 4 mg via INTRAVENOUS
  Filled 2014-05-23 (×4): qty 2

## 2014-05-23 MED ORDER — OXYCODONE HCL 5 MG PO TABS
5.0000 mg | ORAL_TABLET | ORAL | Status: DC | PRN
  Administered 2014-05-23 – 2014-05-25 (×4): 5 mg via ORAL
  Filled 2014-05-23 (×5): qty 1

## 2014-05-23 MED ORDER — IRBESARTAN 150 MG PO TABS
150.0000 mg | ORAL_TABLET | Freq: Every day | ORAL | Status: DC
  Administered 2014-05-23 – 2014-05-25 (×3): 150 mg via ORAL
  Filled 2014-05-23 (×3): qty 1

## 2014-05-23 MED ORDER — HYDROCHLOROTHIAZIDE 25 MG PO TABS
25.0000 mg | ORAL_TABLET | Freq: Every day | ORAL | Status: DC
  Administered 2014-05-23 – 2014-05-25 (×2): 25 mg via ORAL
  Filled 2014-05-23 (×3): qty 1

## 2014-05-23 MED ORDER — LIDOCAINE HCL 1 % IJ SOLN
INTRAMUSCULAR | Status: AC
Start: 2014-05-23 — End: 2014-05-24
  Filled 2014-05-23: qty 20

## 2014-05-23 NOTE — ED Notes (Signed)
This nurse attempted IV unsuccessful  PA attempted IV unsuccessful recommendation to place order for IV team.

## 2014-05-23 NOTE — ED Notes (Signed)
Phlebotomy at bedside.

## 2014-05-23 NOTE — Procedures (Signed)
Interventional Radiology Procedure Note  Procedure: Placement of a right brachial vein PICC.  Tip is positioned at the superior cavoatrial junction and catheter is ready for immediate use.  Complications: No immediate Recommendations:  - Do not submerge - Routine line care   Signed,  Dulcy Fanny. Earleen Newport, DO

## 2014-05-23 NOTE — ED Notes (Signed)
Patient is in IR  

## 2014-05-23 NOTE — ED Provider Notes (Signed)
CSN: [id number]     Arrival date & time 05/23/14  1307 History   First MD Initiated Contact with Patient 05/23/14 1326     Chief Complaint  Patient presents with  . Chest Pain  . Emesis    HPI   59 year old female presents today with epigastric pain, nausea vomiting. Patient reports that yesterday morning she experienced epigastric pain with associated indigestion. She reports that she was able to drink some ginger ale with improvement in symptoms. She notes that in the middle of the night she experienced another episode of pain with associated nausea and vomiting that has continued to persist until today. Patient reports that she also has indigestion with a "burning sensation in her throat". He states that the pain is not made worse by foods, or positioning. Worse with palpation. She reports a significant past medical history of gastric ulcer with positive H. pylori, denies endoscopic evaluation. She states that indigestion symptoms feel similar, but epigastric pain is more painful. Patient reports that she has chronic baseline back pain and takes ibuprofen 600 mg 4 times a day. She also notes that she's recently attempted reducing her blood pressure with curry powder.   Past Medical History  Diagnosis Date  . Hypertension   . TSH elevation   . Hypercholesteremia   . Lacunar infarction   . Degenerative joint disease   . Chronic back pain    Past Surgical History  Procedure Laterality Date  . Abdominal hysterectomy      ovaries remain    History reviewed. No pertinent family history. History  Substance Use Topics  . Smoking status: Never Smoker   . Smokeless tobacco: Never Used  . Alcohol Use: 0.6 oz/week    1 Glasses of wine per week     Comment: occasionally    OB History    No data available     Review of Systems  All other systems reviewed and are negative.  Allergies  Lisinopril  Home Medications   Prior to Admission medications   Medication Sig Start Date End Date  Taking? Authorizing Provider  amLODipine-valsartan (EXFORGE) 10-160 MG per tablet Take 1 tablet by mouth daily.    Historical Provider, MD  ascorbic acid (VITAMIN C) 1000 MG tablet Take 2,000 mg by mouth daily.    Historical Provider, MD  aspirin EC 81 MG tablet Take 81 mg by mouth daily.    Historical Provider, MD  carvedilol (COREG) 12.5 MG tablet Take 12.5 mg by mouth 2 (two) times daily with a meal.    Historical Provider, MD  cholecalciferol (VITAMIN D) 1000 UNITS tablet Take 1,000 Units by mouth daily.    Historical Provider, MD  ezetimibe-simvastatin (VYTORIN) 10-20 MG per tablet Take 1 tablet by mouth at bedtime.    Historical Provider, MD  hydrochlorothiazide (HYDRODIURIL) 25 MG tablet Take 25 mg by mouth daily.    Historical Provider, MD  levothyroxine (SYNTHROID, LEVOTHROID) 25 MCG tablet Take 25 mcg by mouth daily.    Historical Provider, MD  Naproxen Sodium (ALEVE PO) Take 1-2 tablets by mouth every 12 (twelve) hours as needed. Headache or pain    Historical Provider, MD  Omega-3 Fatty Acids (FISH OIL) 1000 MG CAPS Take 1 capsule by mouth daily.    Historical Provider, MD  omeprazole (PRILOSEC OTC) 20 MG tablet Take 20 mg by mouth 2 (two) times daily.    Historical Provider, MD  predniSONE (DELTASONE) 20 MG tablet 3 Tabs PO Days 1-3, then 2 tabs PO Days  4-6, then 1 tab PO Day 7-9, then Half Tab PO Day 10-12 05/17/12   Carlisle Cater, PA-C  traMADol (ULTRAM) 50 MG tablet Take 50 mg by mouth 2 (two) times daily as needed. pain    Historical Provider, MD   BP 122/57 mmHg  Pulse 85  Temp(Src) 98.2 F (36.8 C) (Oral)  Resp 18  Ht 5\' 8"  (1.727 m)  Wt 257 lb 3.2 oz (116.665 kg)  BMI 39.12 kg/m2  SpO2 100% Physical Exam  Constitutional: She is oriented to person, place, and time. She appears well-developed and well-nourished.  HENT:  Head: Normocephalic and atraumatic.  Eyes: Pupils are equal, round, and reactive to light.  Neck: Normal range of motion. Neck supple. No JVD present. No  tracheal deviation present. No thyromegaly present.  Cardiovascular: Normal rate, regular rhythm, normal heart sounds and intact distal pulses.  Exam reveals no gallop and no friction rub.   No murmur heard. Pulmonary/Chest: Effort normal and breath sounds normal. No stridor. No respiratory distress. She has no wheezes. She has no rales. She exhibits no tenderness.  Abdominal: Soft. She exhibits no distension. There is no hepatosplenomegaly, splenomegaly or hepatomegaly. There is tenderness in the right upper quadrant and epigastric area. There is positive Murphy's sign. There is no rigidity, no rebound, no guarding, no CVA tenderness and no tenderness at McBurney's point.  Musculoskeletal: Normal range of motion.  Lymphadenopathy:    She has no cervical adenopathy.  Neurological: She is alert and oriented to person, place, and time. Coordination normal.  Skin: Skin is warm and dry.  Psychiatric: She has a normal mood and affect. Her behavior is normal. Judgment and thought content normal.  Nursing note and vitals reviewed.   ED Course  Procedures (including critical care time) Labs Review Labs Reviewed  BASIC METABOLIC PANEL  [place], ED    Imaging Review US Abdomen Complete  05/23/2014   CLINICAL DATA:  59 year old female with chest pain and vomiting since yesterday. Initial encounter.  EXAM: ULTRASOUND ABDOMEN COMPLETE  COMPARISON:  Lumbar MRI 05/21/2010. Acute abdominal series from today.  FINDINGS: Gallbladder: Numerous shadowing echogenic stones, individually up to 10 mm. Superimposed sludge. Positive sonographic Murphy sign. Gallbladder wall thickness of 3 mm.  Common bile duct: Diameter: 6 mm, upper limits of normal  Liver: Echogenic and somewhat difficult penetrate. No intrahepatic biliary ductal dilatation or discrete liver lesion is identified.  IVC: Incompletely visualized due to overlying bowel gas, visualized portions within normal  limits.  Pancreas: Not visualized due to overlying bowel gas.  Spleen: Size and appearance within normal limits.  Right Kidney: Length: 10.7 cm. Echogenicity within normal limits. No mass or hydronephrosis visualized.  Left Kidney: Length: 12.1 cm. Echogenicity within normal limits. No mass or hydronephrosis visualized.  Abdominal aorta: Incompletely visualized due to overlying bowel gas, visualized portions within normal limits.  Other findings: None.  IMPRESSION: Numerous gallstones and abundant sludge with positive sonographic Murphy sign consistent with Acute Cholecystitis.  No biliary ductal dilatation to suggest biliary obstruction.   Electronically Signed   By: Genevie Ann M.D.   On: 05/23/2014 15:27   Dg Abd Acute W/chest  05/23/2014   CLINICAL DATA:  Chest pain, shortness of breath, nausea and upper abdominal pain for 2 days.  EXAM: DG ABDOMEN ACUTE W/ 1V CHEST  COMPARISON:  PA and lateral chest 02/07/2014.  FINDINGS: Single view of the chest demonstrates clear lungs and normal heart size. No pneumothorax or pleural effusion.  Lung volumes are slightly low.  Two views of the abdomen show no free intraperitoneal air. The bowel gas pattern is nonobstructive. A large volume of stool is seen in the colon, particularly the ascending and transverse. No abnormal abdominal calcification or focal bony abnormality is identified.  IMPRESSION: No acute finding.  Large volume of stool in the ascending and transverse colon.   Electronically Signed   By: Inge Rise M.D.   On: 05/23/2014 14:48     EKG Interpretation   Date/Time:  Wednesday May 23 2014 13:14:03 EDT Ventricular Rate:  88 PR Interval:  166 QRS Duration: 98 QT Interval:  418 QTC Calculation: 505 R Axis:   54 Text Interpretation:  Normal sinus rhythm Septal infarct , age  undetermined Prolonged QT When compared with ECG of 10/15/2011 No  significant change was found Confirmed by [facility]  MD, Nunzio Cory [phone])  on 05/23/2014 2:06:06 PM       MDM   Final diagnoses:  Acute cholecystitis    Labs: CBC, i-STAT troponin, CMP, lipase- leukocytosis 16.5  Imaging: Ultrasound abdomen complete- numerous gallstones and abundant sludge and positive sonographic Murphy sign consistent with acute cholecystitis  Consults: General surgery Dr. Ninfa Linden. Dr. Hulen Skains  Therapeutics: Morphine 4 mg IM, Zofran  Assessment: Acute cholecystitis  Plan: Patient presents with acute cholecystitis evidenced by physical exam, history, ultrasound abdomen. Pt was given oral Zofran and IM morphine which proved to reduce nausea vomiting, and pain. Multiple attempts at IV access were attempted by nursing staff, myself, IV team. Based on patient's symptoms, acute cholecystitis patient require a PICC line for further management. Dr. Ninfa Linden was contacted, general surgery agreed to admit.     Okey Regal, PA-C 05/23/14 [place], DO 05/27/14 [phone]

## 2014-05-23 NOTE — H&P (Signed)
Theresa Nolan is an 59 y.o. female.   Chief Complaint: Epigastric and RUQ abdominal pain HPI: Patient has been sick since yesterday morning, abdominal pain that worsened, associated with severe nausea and vomiting.  No fevers or chills.  Never had symptoms like this before.  [place] in the ED showed acute cholecystitis with cholelithiasis.   Surgery called.  Past Medical History  Diagnosis Date  . Hypertension   . TSH elevation   . Hypercholesteremia   . Lacunar infarction   . Degenerative joint disease   . Chronic back pain     Past Surgical History  Procedure Laterality Date  . Abdominal hysterectomy      ovaries remain     History reviewed. No pertinent family history. Social History:  reports that she has never smoked. She has never used smokeless tobacco. She reports that she drinks about 0.6 oz of alcohol per week. She reports that she does not use illicit drugs.  Allergies:  Allergies  Allergen Reactions  . Lisinopril Cough     (Not in a hospital admission)  Results for orders placed or performed during the hospital encounter of 05/23/14 (from the past 48 hour(s))  Comprehensive metabolic panel     Status: Abnormal   Collection Time: 05/23/14  3:44 PM  Result Value Ref Range   Sodium 139 135 - 145 mmol/L   Potassium 3.6 3.5 - 5.1 mmol/L   Chloride 103 101 - 111 mmol/L   CO2 25 22 - 32 mmol/L   Glucose, Bld 116 (H) 70 - 99 mg/dL   BUN 12 6 - 20 mg/dL   Creatinine, Ser 0.67 0.44 - 1.00 mg/dL   Calcium 9.2 8.9 - 10.3 mg/dL   Total Protein 7.6 6.5 - 8.1 g/dL   Albumin 3.7 3.5 - 5.0 g/dL   AST 17 15 - 41 U/L   ALT 17 14 - 54 U/L   Alkaline Phosphatase 85 38 - 126 U/L   Total Bilirubin 0.8 0.3 - 1.2 mg/dL   GFR calc non Af Amer >60 >60 mL/min   GFR calc Af Amer >60 >60 mL/min    Comment: (NOTE) The eGFR has been calculated using the CKD EPI equation. This calculation has not been validated in all clinical situations. eGFR's persistently <90 mL/min signify  possible Chronic Kidney Disease.    Anion gap 11 5 - 15  Lipase, blood     Status: None   Collection Time: 05/23/14  3:44 PM  Result Value Ref Range   Lipase 22 22 - 51 U/L  I-stat troponin, ED  (not at [facility])     Status: None   Collection Time: 05/23/14  3:50 PM  Result Value Ref Range   Troponin i, poc 0.00 0.00 - 0.08 ng/mL   Comment 3            Comment: Due to the release kinetics of cTnI, a negative result within the first hours of the onset of symptoms does not rule out myocardial infarction with certainty. If myocardial infarction is still suspected, repeat the test at appropriate intervals.   CBC     Status: Abnormal   Collection Time: 05/23/14  4:25 PM  Result Value Ref Range   WBC 16.4 (H) 4.0 - 10.5 K/uL   RBC 4.33 3.87 - 5.11 MIL/uL   Hemoglobin 13.0 12.0 - 15.0 g/dL   HCT 38.2 36.0 - 46.0 %   MCV 88.2 78.0 - 100.0 fL   MCH 30.0 26.0 - 34.0  pg   MCHC 34.0 30.0 - 36.0 g/dL   RDW 12.8 11.5 - 15.5 %   Platelets 337 150 - 400 K/uL  BNP (order ONLY if patient complains of dyspnea/SOB AND you have documented it for THIS visit)     Status: None   Collection Time: 05/23/14  4:25 PM  Result Value Ref Range   B Natriuretic Peptide 79.5 0.0 - 100.0 pg/mL   US Abdomen Complete  05/23/2014   CLINICAL DATA:  59 year old female with chest pain and vomiting since yesterday. Initial encounter.  EXAM: ULTRASOUND ABDOMEN COMPLETE  COMPARISON:  Lumbar MRI 05/21/2010. Acute abdominal series from today.  FINDINGS: Gallbladder: Numerous shadowing echogenic stones, individually up to 10 mm. Superimposed sludge. Positive sonographic Murphy sign. Gallbladder wall thickness of 3 mm.  Common bile duct: Diameter: 6 mm, upper limits of normal  Liver: Echogenic and somewhat difficult penetrate. No intrahepatic biliary ductal dilatation or discrete liver lesion is identified.  IVC: Incompletely visualized due to overlying bowel gas, visualized portions within normal limits.  Pancreas: Not  visualized due to overlying bowel gas.  Spleen: Size and appearance within normal limits.  Right Kidney: Length: 10.7 cm. Echogenicity within normal limits. No mass or hydronephrosis visualized.  Left Kidney: Length: 12.1 cm. Echogenicity within normal limits. No mass or hydronephrosis visualized.  Abdominal aorta: Incompletely visualized due to overlying bowel gas, visualized portions within normal limits.  Other findings: None.  IMPRESSION: Numerous gallstones and abundant sludge with positive sonographic Murphy sign consistent with Acute Cholecystitis.  No biliary ductal dilatation to suggest biliary obstruction.   Electronically Signed   By: Genevie Ann M.D.   On: 05/23/2014 15:27   Dg Abd Acute W/chest  05/23/2014   CLINICAL DATA:  Chest pain, shortness of breath, nausea and upper abdominal pain for 2 days.  EXAM: DG ABDOMEN ACUTE W/ 1V CHEST  COMPARISON:  PA and lateral chest 02/07/2014.  FINDINGS: Single view of the chest demonstrates clear lungs and normal heart size. No pneumothorax or pleural effusion. Lung volumes are slightly low.  Two views of the abdomen show no free intraperitoneal air. The bowel gas pattern is nonobstructive. A large volume of stool is seen in the colon, particularly the ascending and transverse. No abnormal abdominal calcification or focal bony abnormality is identified.  IMPRESSION: No acute finding.  Large volume of stool in the ascending and transverse colon.   Electronically Signed   By: Inge Rise M.D.   On: 05/23/2014 14:48    Review of Systems  Constitutional: Negative.   Gastrointestinal: Positive for nausea, vomiting and abdominal pain.  Neurological: Negative.   Endo/Heme/Allergies: Negative.   All other systems reviewed and are negative.   Blood pressure 144/84, pulse 88, temperature 98.2 F (36.8 C), temperature source Oral, resp. rate 28, height 5' 8"  (1.727 m), weight 116.665 kg (257 lb 3.2 oz), SpO2 97 %. Physical Exam  Vitals  reviewed. Constitutional: She appears well-developed and well-nourished.  Obese  HENT:  Head: Normocephalic and atraumatic.  Eyes: Conjunctivae and EOM are normal. Pupils are equal, round, and reactive to light.  Neck: Normal range of motion. Neck supple.  Cardiovascular: Normal rate, normal heart sounds and intact distal pulses.   Respiratory: Effort normal and breath sounds normal.  GI: Soft. Bowel sounds are decreased. There is tenderness in the right upper quadrant and epigastric area. There is guarding (guarding in the right upper quadrant) and positive Murphy's sign. There is no rigidity and no rebound.  Musculoskeletal: Normal range of  motion.  Neurological: She is alert.  Skin: Skin is warm and dry.  Psychiatric: She has a normal mood and affect. Her behavior is normal. Judgment and thought content normal.     Assessment/Plan Acute cholecystitis associated with cholelithiasis diagnosed by ultrasound.  Admit, pain control, NPO after midnight, clear liquids and ice chips currently.  Yechiel Erny 05/23/2014, 6:15 PM

## 2014-05-23 NOTE — ED Notes (Signed)
Iv team at bedside. Phlebotomy attempted blood unsuccessful. IV notified blood needs to be drawn as well.

## 2014-05-23 NOTE — Consult Note (Signed)
Chief Complaint: Chief Complaint  Patient presents with  . Chest Pain  . Emesis    Referring Physician(s): Okey Regal, PA-C  History of Present Illness: Theresa Nolan is a 59 y.o. female presenting with abdominal pain to the ED.  She is being admitted through the ED for acute cholecystitis.  Currently she is having biliary colic, with no other complaints.   There has been difficulty with IV access, and she has been referred for evaluation for a PICC placement or other image guided line.   Past Medical History  Diagnosis Date  . Hypertension   . TSH elevation   . Hypercholesteremia   . Lacunar infarction   . Degenerative joint disease   . Chronic back pain     Past Surgical History  Procedure Laterality Date  . Abdominal hysterectomy      ovaries remain     Allergies: Lisinopril  Medications: Prior to Admission medications   Medication Sig Start Date End Date Taking? Authorizing Provider  amLODipine-valsartan (EXFORGE) 10-160 MG per tablet Take 1 tablet by mouth daily.    Historical Provider, MD  ascorbic acid (VITAMIN C) 1000 MG tablet Take 2,000 mg by mouth daily.    Historical Provider, MD  aspirin EC 81 MG tablet Take 81 mg by mouth daily.    Historical Provider, MD  carvedilol (COREG) 12.5 MG tablet Take 12.5 mg by mouth 2 (two) times daily with a meal.    Historical Provider, MD  cholecalciferol (VITAMIN D) 1000 UNITS tablet Take 1,000 Units by mouth daily.    Historical Provider, MD  ezetimibe-simvastatin (VYTORIN) 10-20 MG per tablet Take 1 tablet by mouth at bedtime.    Historical Provider, MD  hydrochlorothiazide (HYDRODIURIL) 25 MG tablet Take 25 mg by mouth daily.    Historical Provider, MD  levothyroxine (SYNTHROID, LEVOTHROID) 25 MCG tablet Take 25 mcg by mouth daily.    Historical Provider, MD  Naproxen Sodium (ALEVE PO) Take 1-2 tablets by mouth every 12 (twelve) hours as needed. Headache or pain    Historical Provider, MD  Omega-3 Fatty Acids  (FISH OIL) 1000 MG CAPS Take 1 capsule by mouth daily.    Historical Provider, MD  omeprazole (PRILOSEC OTC) 20 MG tablet Take 20 mg by mouth 2 (two) times daily.    Historical Provider, MD  predniSONE (DELTASONE) 20 MG tablet 3 Tabs PO Days 1-3, then 2 tabs PO Days 4-6, then 1 tab PO Day 7-9, then Half Tab PO Day 10-12 05/17/12   Carlisle Cater, PA-C  traMADol (ULTRAM) 50 MG tablet Take 50 mg by mouth 2 (two) times daily as needed. pain    Historical Provider, MD     History reviewed. No pertinent family history.  History   Social History  . Marital Status: Widowed    Spouse Name: N/A  . Number of Children: N/A  . Years of Education: N/A   Social History Main Topics  . Smoking status: Never Smoker   . Smokeless tobacco: Never Used  . Alcohol Use: 0.6 oz/week    1 Glasses of wine per week     Comment: occasionally   . Drug Use: No  . Sexual Activity: Not on file   Other Topics Concern  . None   Social History Narrative     Review of Systems: A 12 point ROS discussed and pertinent positives are indicated in the HPI above.  All other systems are negative.  Review of Systems  Vital Signs: BP 156/80  mmHg  Pulse 83  Temp(Src) 98.2 F (36.8 C) (Oral)  Resp 29  Ht 5\' 8"  (1.727 m)  Wt 257 lb 3.2 oz (116.665 kg)  BMI 39.12 kg/m2  SpO2 98%  Physical Exam  Atraumatic, Normocephalic. No icterus. No scleral injection. Mucous membranes moist, pink.  Normal affect. Answers questions appropriately.  Tearful, uncomfortable.  A & O x3 Chest is normal inspiration, expiration, with no accessory muscle usage.   RRR.  Abdomen is tender to palpation.  Obese. GU deferred. Upper extremities without any rash or wound.  Targeted US survey shows patent right brachial vein.    Mallampati Score:     Imaging: US Abdomen Complete  05/23/2014   CLINICAL DATA:  59 year old female with chest pain and vomiting since yesterday. Initial encounter.  EXAM: ULTRASOUND ABDOMEN COMPLETE   COMPARISON:  Lumbar MRI 05/21/2010. Acute abdominal series from today.  FINDINGS: Gallbladder: Numerous shadowing echogenic stones, individually up to 10 mm. Superimposed sludge. Positive sonographic Murphy sign. Gallbladder wall thickness of 3 mm.  Common bile duct: Diameter: 6 mm, upper limits of normal  Liver: Echogenic and somewhat difficult penetrate. No intrahepatic biliary ductal dilatation or discrete liver lesion is identified.  IVC: Incompletely visualized due to overlying bowel gas, visualized portions within normal limits.  Pancreas: Not visualized due to overlying bowel gas.  Spleen: Size and appearance within normal limits.  Right Kidney: Length: 10.7 cm. Echogenicity within normal limits. No mass or hydronephrosis visualized.  Left Kidney: Length: 12.1 cm. Echogenicity within normal limits. No mass or hydronephrosis visualized.  Abdominal aorta: Incompletely visualized due to overlying bowel gas, visualized portions within normal limits.  Other findings: None.  IMPRESSION: Numerous gallstones and abundant sludge with positive sonographic Murphy sign consistent with Acute Cholecystitis.  No biliary ductal dilatation to suggest biliary obstruction.   Electronically Signed   By: Genevie Ann M.D.   On: 05/23/2014 15:27   Dg Abd Acute W/chest  05/23/2014   CLINICAL DATA:  Chest pain, shortness of breath, nausea and upper abdominal pain for 2 days.  EXAM: DG ABDOMEN ACUTE W/ 1V CHEST  COMPARISON:  PA and lateral chest 02/07/2014.  FINDINGS: Single view of the chest demonstrates clear lungs and normal heart size. No pneumothorax or pleural effusion. Lung volumes are slightly low.  Two views of the abdomen show no free intraperitoneal air. The bowel gas pattern is nonobstructive. A large volume of stool is seen in the colon, particularly the ascending and transverse. No abnormal abdominal calcification or focal bony abnormality is identified.  IMPRESSION: No acute finding.  Large volume of stool in the  ascending and transverse colon.   Electronically Signed   By: Inge Rise M.D.   On: 05/23/2014 14:48    Labs:  CBC:  Recent Labs  05/23/14 1625  WBC 16.4*  HGB 13.0  HCT 38.2  PLT 337    COAGS: No results for input(s): INR, APTT in the last 8760 hours.  BMP:  Recent Labs  05/23/14 1544  NA 139  K 3.6  CL 103  CO2 25  GLUCOSE 116*  BUN 12  CALCIUM 9.2  CREATININE 0.67  GFRNONAA >60  GFRAA >60    LIVER FUNCTION TESTS:  Recent Labs  05/23/14 1544  BILITOT 0.8  AST 17  ALT 17  ALKPHOS 85  PROT 7.6  ALBUMIN 3.7    TUMOR MARKERS: No results for input(s): AFPTM, CEA, CA199, CHROMGRNA in the last 8760 hours.  Assessment and Plan:  Patient is a 59  yo female with acute cholecystitis.  She is being admitted for her condition, with possible surgery.  She will require IV access, which the team has not been successful.  We will plan on an [place] /fluoro guided PICC.    Thank you for this interesting consult.  I greatly enjoyed meeting TAKYRA CANTRALL and look forward to participating in their care.  SignedCorrie Mckusick 05/23/2014, 5:23 PM   I spent a total of 20 Minutes    in face to face in clinical consultation, greater than 50% of which was counseling/coordinating care for acute surgical problem/cholecystitis, difficult venous access, and for central line placement.

## 2014-05-23 NOTE — ED Notes (Signed)
Attempted report 

## 2014-05-23 NOTE — ED Notes (Signed)
Pt reports onset Tuesday of mid chest pains, sob and n/v. Denies swelling.

## 2014-05-23 NOTE — ED Notes (Signed)
PA notified Phlebotomy  unable to get blood for CBC

## 2014-05-23 NOTE — ED Notes (Signed)
Spoke with IV team states they are unable to place PICC line today. Advised to call IR and see if they are able to place PICC. IR states they would call back and see. PA made aware.

## 2014-05-23 NOTE — ED Notes (Signed)
Pa notified IV team unable to get IV or Blood. PA stated okay for patient to go X- ray with IV and Blood work.

## 2014-05-23 NOTE — Progress Notes (Addendum)
ANTIBIOTIC CONSULT NOTE - INITIAL  Pharmacy Consult for Zosyn Indication: Acute cholecytitis  Allergies  Allergen Reactions  . Lisinopril Cough    Patient Measurements: Height: 5\' 8"  (172.7 cm) Weight: 257 lb 3.2 oz (116.665 kg) IBW/kg (Calculated) : 63.9  Vital Signs: Temp: 98.2 F (36.8 C) (05/04 1315) Temp Source: Oral (05/04 1315) BP: 156/80 mmHg (05/04 1645) Pulse Rate: 83 (05/04 1645) Intake/Output from previous day:   Intake/Output from this shift:    Labs:  Recent Labs  05/23/14 1544 05/23/14 1625  WBC  --  16.4*  HGB  --  13.0  PLT  --  337  CREATININE 0.67  --    Estimated Creatinine Clearance: 102.9 mL/min (by C-G formula based on Cr of 0.67). No results for input(s): VANCOTROUGH, VANCOPEAK, VANCORANDOM, GENTTROUGH, GENTPEAK, GENTRANDOM, TOBRATROUGH, TOBRAPEAK, TOBRARND, AMIKACINPEAK, AMIKACINTROU, AMIKACIN in the last 72 hours.   Microbiology: No results found for this or any previous visit (from the past 720 hour(s)).  Medical History: Past Medical History  Diagnosis Date  . Hypertension   . TSH elevation   . Hypercholesteremia   . Lacunar infarction   . Degenerative joint disease   . Chronic back pain     Medications:  Scheduled:  . lidocaine       Infusions:  . piperacillin-tazobactam     Assessment: 59 yo F presenting on 05/23/2014 with acute cholecystitis. Pharmacy consulted to dose Zosyn. Tmax 98.2, WBC elevated at 16.4, SCr 0.67 (CrCl >100 ml/min).   Goal of Therapy:  Resolution of infection  Plan:  - Zosyn 3.375 gm IV over 30 minutes, followed by 3.375 gm IV q8h (4 hour infusion) - Monitor renal function, temp, WBC, C&S  Raini Tiley K. Velva Harman, PharmD, [place] Clinical Pharmacist - Resident Pager: [phone] Pharmacy: [phone] 05/23/2014 5:31 PM

## 2014-05-24 ENCOUNTER — Observation Stay (HOSPITAL_COMMUNITY): Payer: 59 | Admitting: Certified Registered"

## 2014-05-24 ENCOUNTER — Observation Stay (HOSPITAL_COMMUNITY): Payer: 59

## 2014-05-24 ENCOUNTER — Encounter (HOSPITAL_COMMUNITY)
Admission: EM | Disposition: A | Discharge status: Home / Self Care | Payer: Self-pay | Source: Home / Self Care | Attending: Emergency Medicine

## 2014-05-24 ENCOUNTER — Encounter (HOSPITAL_COMMUNITY): Payer: Self-pay | Admitting: Certified Registered"

## 2014-05-24 HISTORY — PX: CHOLECYSTECTOMY: SHX55

## 2014-05-24 LAB — COMPREHENSIVE METABOLIC PANEL
ALT: 40 U/L (ref 14–54)
AST: 40 U/L (ref 15–41)
Albumin: 3.4 g/dL — ABNORMAL LOW (ref 3.5–5.0)
Alkaline Phosphatase: 116 U/L (ref 38–126)
Anion gap: 9 (ref 5–15)
BILIRUBIN TOTAL: 0.9 mg/dL (ref 0.3–1.2)
BUN: 8 mg/dL (ref 6–20)
CHLORIDE: 100 mmol/L — AB (ref 101–111)
CO2: 27 mmol/L (ref 22–32)
Calcium: 9 mg/dL (ref 8.9–10.3)
Creatinine, Ser: 0.64 mg/dL (ref 0.44–1.00)
GFR calc Af Amer: >60 mL/min (ref >60)
GFR calc non Af Amer: >60 mL/min (ref >60)
Glucose, Bld: 151 mg/dL — ABNORMAL HIGH (ref 70–99)
POTASSIUM: 3 mmol/L — AB (ref 3.5–5.1)
Sodium: 136 mmol/L (ref 135–145)
Total Protein: 7.5 g/dL (ref 6.5–8.1)

## 2014-05-24 LAB — CBC
HCT: 37.3 % (ref 36.0–46.0)
Hemoglobin: 12.5 g/dL (ref 12.0–15.0)
MCH: 30 pg (ref 26.0–34.0)
MCHC: 33.5 g/dL (ref 30.0–36.0)
MCV: 89.4 fL (ref 78.0–100.0)
Platelets: 325 K/uL (ref 150–400)
RBC: 4.17 MIL/uL (ref 3.87–5.11)
RDW: 13.1 % (ref 11.5–15.5)
WBC: 20.4 K/uL — ABNORMAL HIGH (ref 4.0–10.5)

## 2014-05-24 LAB — SURGICAL PCR SCREEN
MRSA, PCR: NEGATIVE
STAPHYLOCOCCUS AUREUS: NEGATIVE

## 2014-05-24 SURGERY — LAPAROSCOPIC CHOLECYSTECTOMY WITH INTRAOPERATIVE CHOLANGIOGRAM
Anesthesia: General | Site: Abdomen

## 2014-05-24 MED ORDER — BUPIVACAINE-EPINEPHRINE 0.25% -1:200000 IJ SOLN
INTRAMUSCULAR | Status: DC | PRN
  Administered 2014-05-24: 20 mL

## 2014-05-24 MED ORDER — 0.9 % SODIUM CHLORIDE (POUR BTL) OPTIME
TOPICAL | Status: DC | PRN
  Administered 2014-05-24: 1000 mL

## 2014-05-24 MED ORDER — FENTANYL CITRATE (PF) 100 MCG/2ML IJ SOLN
INTRAMUSCULAR | Status: DC | PRN
  Administered 2014-05-24: 125 ug via INTRAVENOUS
  Administered 2014-05-24: 75 ug via INTRAVENOUS

## 2014-05-24 MED ORDER — HYDROMORPHONE HCL 1 MG/ML IJ SOLN
0.2500 mg | INTRAMUSCULAR | Status: DC | PRN
  Administered 2014-05-24: 0.5 mg via INTRAVENOUS

## 2014-05-24 MED ORDER — PROMETHAZINE HCL 25 MG/ML IJ SOLN
12.5000 mg | Freq: Four times a day (QID) | INTRAMUSCULAR | Status: DC | PRN
  Administered 2014-05-24 – 2014-05-25 (×5): 12.5 mg via INTRAVENOUS
  Filled 2014-05-24 (×5): qty 1

## 2014-05-24 MED ORDER — MIDAZOLAM HCL 2 MG/2ML IJ SOLN
INTRAMUSCULAR | Status: AC
  Filled 2014-05-24: qty 2

## 2014-05-24 MED ORDER — PROPOFOL 10 MG/ML IV BOLUS
INTRAVENOUS | Status: DC | PRN
  Administered 2014-05-24: 110 mg via INTRAVENOUS

## 2014-05-24 MED ORDER — LACTATED RINGERS IV SOLN
INTRAVENOUS | Status: DC
  Administered 2014-05-24: 10:00:00 via INTRAVENOUS

## 2014-05-24 MED ORDER — ROCURONIUM BROMIDE 100 MG/10ML IV SOLN
INTRAVENOUS | Status: DC | PRN
  Administered 2014-05-24: 30 mg via INTRAVENOUS

## 2014-05-24 MED ORDER — CARVEDILOL 12.5 MG PO TABS
12.5000 mg | ORAL_TABLET | Freq: Two times a day (BID) | ORAL | Status: DC
  Administered 2014-05-24 – 2014-05-25 (×2): 12.5 mg via ORAL
  Filled 2014-05-24 (×2): qty 1

## 2014-05-24 MED ORDER — SODIUM CHLORIDE 0.9 % IR SOLN
Status: DC | PRN
  Administered 2014-05-24: 1000 mL

## 2014-05-24 MED ORDER — ONDANSETRON HCL 4 MG/2ML IJ SOLN: 4.0000 mg | Freq: Once | INTRAMUSCULAR | Status: DC | PRN

## 2014-05-24 MED ORDER — MEPERIDINE HCL 25 MG/ML IJ SOLN: 6.2500 mg | INTRAMUSCULAR | Status: DC | PRN

## 2014-05-24 MED ORDER — COENZYME Q10 30 MG PO CAPS: 30.0000 mg | ORAL_CAPSULE | Freq: Every evening | ORAL | Status: DC

## 2014-05-24 MED ORDER — ARTIFICIAL TEARS OP OINT
TOPICAL_OINTMENT | OPHTHALMIC | Status: AC
  Filled 2014-05-24: qty 3.5

## 2014-05-24 MED ORDER — HYDROMORPHONE HCL 1 MG/ML IJ SOLN
INTRAMUSCULAR | Status: AC
  Filled 2014-05-24: qty 1

## 2014-05-24 MED ORDER — BUPIVACAINE-EPINEPHRINE (PF) 0.25% -1:200000 IJ SOLN
INTRAMUSCULAR | Status: AC
  Filled 2014-05-24: qty 30

## 2014-05-24 MED ORDER — POTASSIUM CHLORIDE 10 MEQ/100ML IV SOLN
10.0000 meq | INTRAVENOUS | Status: AC
  Administered 2014-05-24: 10 meq via INTRAVENOUS
  Filled 2014-05-24 (×2): qty 100

## 2014-05-24 MED ORDER — OXYCODONE-ACETAMINOPHEN 5-325 MG PO TABS
1.0000 | ORAL_TABLET | ORAL | Status: DC | PRN
  Administered 2014-05-24 – 2014-05-25 (×2): 2 via ORAL
  Filled 2014-05-24 (×2): qty 2

## 2014-05-24 MED ORDER — SUCCINYLCHOLINE CHLORIDE 20 MG/ML IJ SOLN
INTRAMUSCULAR | Status: DC | PRN
  Administered 2014-05-24: 120 mg via INTRAVENOUS

## 2014-05-24 MED ORDER — LIDOCAINE HCL (CARDIAC) 20 MG/ML IV SOLN
INTRAVENOUS | Status: DC | PRN
  Administered 2014-05-24: 100 mg via INTRAVENOUS

## 2014-05-24 MED ORDER — FENTANYL CITRATE (PF) 250 MCG/5ML IJ SOLN
INTRAMUSCULAR | Status: AC
  Filled 2014-05-24: qty 5

## 2014-05-24 MED ORDER — SODIUM CHLORIDE 0.9 % IV SOLN
INTRAVENOUS | Status: DC | PRN
  Administered 2014-05-24: 50 mL

## 2014-05-24 MED ORDER — NEOSTIGMINE METHYLSULFATE 10 MG/10ML IV SOLN
INTRAVENOUS | Status: DC | PRN
  Administered 2014-05-24: 4 mg via INTRAVENOUS

## 2014-05-24 MED ORDER — ONDANSETRON HCL 4 MG/2ML IJ SOLN
INTRAMUSCULAR | Status: AC
  Filled 2014-05-24: qty 2

## 2014-05-24 MED ORDER — SODIUM CHLORIDE 0.9 % IJ SOLN
10.0000 mL | INTRAMUSCULAR | Status: DC | PRN
  Administered 2014-05-24 – 2014-05-25 (×2): 10 mL
  Filled 2014-05-24: qty 40

## 2014-05-24 MED ORDER — ONDANSETRON HCL 4 MG/2ML IJ SOLN
INTRAMUSCULAR | Status: DC | PRN
  Administered 2014-05-24: 4 mg via INTRAVENOUS

## 2014-05-24 MED ORDER — PROPOFOL 10 MG/ML IV BOLUS
INTRAVENOUS | Status: AC
  Filled 2014-05-24: qty 20

## 2014-05-24 MED ORDER — LIDOCAINE HCL (CARDIAC) 20 MG/ML IV SOLN
INTRAVENOUS | Status: AC
  Filled 2014-05-24: qty 5

## 2014-05-24 MED ORDER — ROCURONIUM BROMIDE 50 MG/5ML IV SOLN
INTRAVENOUS | Status: AC
  Filled 2014-05-24: qty 1

## 2014-05-24 MED ORDER — POTASSIUM CHLORIDE 10 MEQ/100ML IV SOLN
10.0000 meq | Freq: Once | INTRAVENOUS | Status: AC
  Administered 2014-05-24: 10 meq via INTRAVENOUS
  Filled 2014-05-24: qty 100

## 2014-05-24 MED ORDER — POTASSIUM CHLORIDE 10 MEQ/100ML IV SOLN
INTRAVENOUS | Status: DC | PRN
  Administered 2014-05-24: 10 meq via INTRAVENOUS

## 2014-05-24 MED ORDER — GLYCOPYRROLATE 0.2 MG/ML IJ SOLN
INTRAMUSCULAR | Status: DC | PRN
  Administered 2014-05-24: 0.6 mg via INTRAVENOUS

## 2014-05-24 MED ORDER — MIDAZOLAM HCL 5 MG/5ML IJ SOLN
INTRAMUSCULAR | Status: DC | PRN
  Administered 2014-05-24 (×2): 1 mg via INTRAVENOUS

## 2014-05-24 SURGICAL SUPPLY — 39 items
APPLIER CLIP 5 13 M/L LIGAMAX5 (MISCELLANEOUS) ×2
BLADE SURG CLIPPER 3M 9600 (MISCELLANEOUS) IMPLANT
CANISTER SUCTION 2500CC (MISCELLANEOUS) ×2 IMPLANT
CHLORAPREP W/TINT 26ML (MISCELLANEOUS) ×2 IMPLANT
CLIP APPLIE 5 13 M/L LIGAMAX5 (MISCELLANEOUS) ×1 IMPLANT
COVER MAYO STAND STRL (DRAPES) ×2 IMPLANT
COVER SURGICAL LIGHT HANDLE (MISCELLANEOUS) ×2 IMPLANT
DRAPE C-ARM 42X72 X-RAY (DRAPES) ×2 IMPLANT
DRAPE LAPAROSCOPIC ABDOMINAL (DRAPES) ×2 IMPLANT
ELECT REM PT RETURN 9FT ADLT (ELECTROSURGICAL) ×2
ELECTRODE REM PT RTRN 9FT ADLT (ELECTROSURGICAL) ×1 IMPLANT
GLOVE BIO SURGEON STRL SZ 6.5 (GLOVE) ×2 IMPLANT
GLOVE BIO SURGEON STRL SZ7 (GLOVE) ×4 IMPLANT
GLOVE BIOGEL PI IND STRL 7.0 (GLOVE) ×1 IMPLANT
GLOVE BIOGEL PI INDICATOR 7.0 (GLOVE) ×1
GLOVE SURG SIGNA 7.5 PF LTX (GLOVE) ×2 IMPLANT
GLOVE SURG SS PI 6.5 STRL IVOR (GLOVE) ×2 IMPLANT
GOWN STRL REUS W/ TWL LRG LVL3 (GOWN DISPOSABLE) ×3 IMPLANT
GOWN STRL REUS W/ TWL XL LVL3 (GOWN DISPOSABLE) ×1 IMPLANT
GOWN STRL REUS W/TWL LRG LVL3 (GOWN DISPOSABLE) ×3
GOWN STRL REUS W/TWL XL LVL3 (GOWN DISPOSABLE) ×1
KIT BASIN OR (CUSTOM PROCEDURE TRAY) ×2 IMPLANT
KIT ROOM TURNOVER OR (KITS) ×2 IMPLANT
LIQUID BAND (GAUZE/BANDAGES/DRESSINGS) ×2 IMPLANT
NS IRRIG 1000ML POUR BTL (IV SOLUTION) ×2 IMPLANT
PAD ARMBOARD 7.5X6 YLW CONV (MISCELLANEOUS) ×2 IMPLANT
POUCH SPECIMEN RETRIEVAL 10MM (ENDOMECHANICALS) ×2 IMPLANT
SCISSORS LAP 5X35 DISP (ENDOMECHANICALS) ×2 IMPLANT
SET CHOLANGIOGRAPH 5 50 .035 (SET/KITS/TRAYS/PACK) ×2 IMPLANT
SET IRRIG TUBING LAPAROSCOPIC (IRRIGATION / IRRIGATOR) ×2 IMPLANT
SLEEVE ENDOPATH XCEL 5M (ENDOMECHANICALS) ×4 IMPLANT
SPECIMEN JAR SMALL (MISCELLANEOUS) ×2 IMPLANT
SUT MON AB 4-0 PC3 18 (SUTURE) ×2 IMPLANT
TOWEL OR 17X24 6PK STRL BLUE (TOWEL DISPOSABLE) ×2 IMPLANT
TOWEL OR 17X26 10 PK STRL BLUE (TOWEL DISPOSABLE) ×2 IMPLANT
TRAY LAPAROSCOPIC (CUSTOM PROCEDURE TRAY) ×2 IMPLANT
TROCAR XCEL BLUNT TIP 100MML (ENDOMECHANICALS) ×2 IMPLANT
TROCAR XCEL NON-BLD 5MMX100MML (ENDOMECHANICALS) ×2 IMPLANT
TUBING INSUFFLATION (TUBING) ×2 IMPLANT

## 2014-05-24 NOTE — Anesthesia Preprocedure Evaluation (Addendum)
Anesthesia Evaluation  Patient identified by MRN, date of birth, ID band Patient awake    Reviewed: Allergy & Precautions, NPO status , Patient's Chart, lab work & pertinent test results  Airway Mallampati: II  TM Distance: >3 FB Neck ROM: Full    Dental  (+) Teeth Intact, Dental Advisory Given   Pulmonary    Pulmonary exam normal       Cardiovascular hypertension, Pt. on medications Normal cardiovascular exam    Neuro/Psych    GI/Hepatic   Endo/Other    Renal/GU      Musculoskeletal   Abdominal   Peds  Hematology   Anesthesia Other Findings   Reproductive/Obstetrics                           Anesthesia Physical Anesthesia Plan  ASA: II  Anesthesia Plan: General   Post-op Pain Management:    Induction: Intravenous  Airway Management Planned: Oral ETT  Additional Equipment:   Intra-op Plan:   Post-operative Plan: Extubation in OR  Informed Consent: I have reviewed the patients History and Physical, chart, labs and discussed the procedure including the risks, benefits and alternatives for the proposed anesthesia with the patient or authorized representative who has indicated his/her understanding and acceptance.     Plan Discussed with: CRNA and Surgeon  Anesthesia Plan Comments:         Anesthesia Quick Evaluation

## 2014-05-24 NOTE — Progress Notes (Signed)
Pt placed on tele, [place] called and asked to send Zosyn and Potassium down to station 25.

## 2014-05-24 NOTE — Anesthesia Postprocedure Evaluation (Signed)
  Anesthesia Post-op Note  Patient: Theresa Nolan  Procedure(s) Performed: Procedure(s): LAPAROSCOPIC CHOLECYSTECTOMY WITH INTRAOPERATIVE CHOLANGIOGRAM (N/A)  Patient Location: PACU  Anesthesia Type:General  Level of Consciousness: awake and alert   Airway and Oxygen Therapy: Patient Spontanous Breathing  Post-op Pain: mild  Post-op Assessment: Post-op Vital signs reviewed  Post-op Vital Signs: stable  Last Vitals:  Filed Vitals:   05/24/14 1239  BP: 123/77  Pulse: 82  Temp:   Resp: 21    Complications: No apparent anesthesia complications

## 2014-05-24 NOTE — Progress Notes (Signed)
Patient ID: Theresa Nolan, female   DOB: Feb 28, 1955, 59 y.o.   MRN: [id number]  Patient with acute cholecystitis and gallstones, increasing WBC  Needs lap chole today. I discussed the procedure in detail.   We discussed the risks and benefits of a laparoscopic cholecystectomy and possible cholangiogram including, but not limited to bleeding, infection, injury to surrounding structures such as the intestine or liver, bile leak, retained gallstones, need to convert to an open procedure, prolonged diarrhea, blood clots such as  DVT, common bile duct injury, anesthesia risks, and possible need for additional procedures.  The likelihood of improvement in symptoms and return to the patient's normal status is good. We discussed the typical post-operative recovery course.  Will replace K+ with IV potassium  Dr. Ninfa Linden

## 2014-05-24 NOTE — Anesthesia Procedure Notes (Signed)
Procedure Name: Intubation Date/Time: 05/24/2014 11:15 AM Performed by: Barrington Ellison Pre-anesthesia Checklist: Emergency Drugs available, Patient identified, Suction available and Patient being monitored Patient Re-evaluated:Patient Re-evaluated prior to inductionOxygen Delivery Method: Circle system utilized Preoxygenation: Pre-oxygenation with 100% oxygen Intubation Type: IV induction Ventilation: Mask ventilation without difficulty Laryngoscope Size: Mac and 3 Grade View: Grade II Tube type: Oral Tube size: 7.5 mm Number of attempts: 1 Airway Equipment and Method: Stylet Placement Confirmation: ETT inserted through vocal cords under direct vision and breath sounds checked- equal and bilateral Secured at: 22 (in at teeth) cm Tube secured with: Tape Dental Injury: Teeth and Oropharynx as per pre-operative assessment

## 2014-05-24 NOTE — Transfer of Care (Signed)
Immediate Anesthesia Transfer of Care Note  Patient: Theresa Nolan  Procedure(s) Performed: Procedure(s): LAPAROSCOPIC CHOLECYSTECTOMY WITH INTRAOPERATIVE CHOLANGIOGRAM (N/A)  Patient Location: PACU  Anesthesia Type:General  Level of Consciousness: awake, alert  and oriented  Airway & Oxygen Therapy: Patient Spontanous Breathing and Patient connected to face mask oxygen  Post-op Assessment: Report given to RN  Post vital signs: Reviewed and stable  Last Vitals:  Filed Vitals:   05/24/14 0825  BP: 140/83  Pulse: 88  Temp: 36.9 C  Resp: 20    Complications: No apparent anesthesia complications

## 2014-05-24 NOTE — Op Note (Signed)
Laparoscopic Cholecystectomy with IOC Procedure Note  Indications: This patient presents with symptomatic gallbladder disease and will undergo laparoscopic cholecystectomy.  Pre-operative Diagnosis: Calculus of gallbladder with acute cholecystitis, without mention of obstruction  Post-operative Diagnosis: Same  Surgeon: Coralie Keens A   Assistants: 0  Anesthesia: General endotracheal anesthesia  ASA Class: 2  Procedure Details  The patient was seen again in the Holding Room. The risks, benefits, complications, treatment options, and expected outcomes were discussed with the patient. The possibilities of reaction to medication, pulmonary aspiration, perforation of viscus, bleeding, recurrent infection, finding a normal gallbladder, the need for additional procedures, failure to diagnose a condition, the possible need to convert to an open procedure, and creating a complication requiring transfusion or operation were discussed with the patient. The likelihood of improving the patient's symptoms with return to their baseline status is good.  The patient and/or family concurred with the proposed plan, giving informed consent. The site of surgery properly noted. The patient was taken to Operating Room, identified as Theresa Nolan and the procedure verified as Laparoscopic Cholecystectomy with Intraoperative Cholangiogram. A Time Out was held and the above information confirmed.  Prior to the induction of general anesthesia, antibiotic prophylaxis was administered. General endotracheal anesthesia was then administered and tolerated well. After the induction, the abdomen was prepped with Chloraprep and draped in the sterile fashion. The patient was positioned in the supine position.  Local anesthetic agent was injected into the skin near the umbilicus and an incision made. We dissected down to the abdominal fascia with blunt dissection.  The fascia was incised vertically and we entered the  peritoneal cavity bluntly.  A pursestring suture of 0-Vicryl was placed around the fascial opening.  The Hasson cannula was inserted and secured with the stay suture.  Pneumoperitoneum was then created with CO2 and tolerated well without any adverse changes in the patient's vital signs. A 5-mm port was placed in the subxiphoid position.  Two 5-mm ports were placed in the right upper quadrant. All skin incisions were infiltrated with a local anesthetic agent before making the incision and placing the trocars.   We positioned the patient in reverse Trendelenburg, tilted slightly to the patient's left.  The gallbladder was identified and found to be inflamed and acutely distended.  Bile was aspirated from the gallbladder to facilitate grasping it.  Once this was done, the fundus was grasped and retracted cephalad. Adhesions were lysed bluntly and with the electrocautery where indicated, taking care not to injure any adjacent organs or viscus. The infundibulum was grasped and retracted laterally, exposing the peritoneum overlying the triangle of Calot. This was then divided and exposed in a blunt fashion. A critical view of the cystic duct and cystic artery was obtained.  The cystic duct was clearly identified and bluntly dissected circumferentially. The cystic duct was ligated with a clip distally.   An incision was made in the cystic duct and the [facility] cholangiogram catheter introduced. The catheter was secured using a clip. A cholangiogram was then obtained which showed good visualization of the distal and proximal biliary tree with no sign of filling defects or obstruction.  Contrast flowed easily into the duodenum. The catheter was then removed.   The cystic duct was then ligated with clips and divided. The cystic artery was identified, dissected free, ligated with clips and divided as well.   The gallbladder was dissected from the liver bed in retrograde fashion with the electrocautery. The gallbladder was  removed and placed in an  Endocatch sac. The liver bed was irrigated and inspected. Hemostasis was achieved with the electrocautery. Copious irrigation was utilized and was repeatedly aspirated until clear.  The gallbladder and Endocatch sac were then removed through the umbilical port site.  The pursestring suture was used to close the umbilical fascia.    We again inspected the right upper quadrant for hemostasis.  Pneumoperitoneum was released as we removed the trocars.  4-0 Monocryl was used to close the skin.   Skin glue was then applied. The patient was then extubated and brought to the recovery room in stable condition. Instrument, sponge, and needle counts were correct at closure and at the conclusion of the case.   Findings: Cholecystitis with Cholelithiasis  Estimated Blood Loss: Minimal         Drains: 0         Specimens: Gallbladder           Complications: None; patient tolerated the procedure well.         Disposition: PACU - hemodynamically stable.         Condition: stable

## 2014-05-25 MED ORDER — OXYCODONE-ACETAMINOPHEN 5-325 MG PO TABS: 1.0000 | ORAL_TABLET | ORAL | Status: DC | PRN

## 2014-05-25 NOTE — Discharge Summary (Signed)
Patient ID: Theresa Nolan MRN: [id number] DOB/AGE: 58-27-1957 58 y.o.  Admit date: 05/23/2014 Discharge date: 05/25/2014  Procedures: lap chole with IOC by Dr. Ninfa Linden 05-24-14  Consults: None  Reason for Admission: Patient has been sick since yesterday morning, abdominal pain that worsened, associated with severe nausea and vomiting. No fevers or chills. Never had symptoms like this before. [place] in the ED showed acute cholecystitis with cholelithiasis. Surgery called.  Admission Diagnoses:  1. Acute cholecystitis  Hospital Course:  The patient was admitted and taken to the operating room where she underwent a lap chole with IOC.  She tolerated the procedure well.  On POD 1, her pain was well controlled with oral pain meds and she was tolerating a regular diet.  She was felt stable for dc home.  PE: Abd: soft, appropriately tender, +BS, ND, incisions c/d/i  Discharge Diagnoses:  Active Problems:   Cholecystitis s/p lap chole  Discharge Medications:   Medication List    TAKE these medications        amLODipine-valsartan 10-160 MG per tablet  Commonly known as:  EXFORGE  Take 1 tablet by mouth every evening.     aspirin EC 81 MG tablet  Take 81 mg by mouth daily.     B-12 SL  Place 1 tablet under the tongue as needed (anemia).     carvedilol 12.5 MG tablet  Commonly known as:  COREG  Take 12.5 mg by mouth 2 (two) times daily with a meal.     cholecalciferol 1000 UNITS tablet  Commonly known as:  VITAMIN D  Take 1,000 Units by mouth daily.     co-enzyme Q-10 30 MG capsule  Take 30 mg by mouth every evening.     ezetimibe-simvastatin 10-20 MG per tablet  Commonly known as:  VYTORIN  Take 1 tablet by mouth every evening.     Fish Oil 1000 MG Caps  Take 1 capsule by mouth daily.     hydrochlorothiazide 25 MG tablet  Commonly known as:  HYDRODIURIL  Take 25 mg by mouth every evening.     levothyroxine 25 MCG tablet  Commonly known as:  SYNTHROID, LEVOTHROID   Take 25 mcg by mouth daily.     MUSCLE RUB 10-15 % Crea  Apply 1 application topically as needed for muscle pain.     naproxen sodium 220 MG tablet  Commonly known as:  ANAPROX  Take 220-440 mg by mouth 2 (two) times daily as needed (pain).     omeprazole 20 MG tablet  Commonly known as:  PRILOSEC OTC  Take 20 mg by mouth 2 (two) times daily.     oxyCODONE-acetaminophen 5-325 MG per tablet  Commonly known as:  PERCOCET/ROXICET  Take 1-2 tablets by mouth every 4 (four) hours as needed for moderate pain.     predniSONE 20 MG tablet  Commonly known as:  DELTASONE  3 Tabs PO Days 1-3, then 2 tabs PO Days 4-6, then 1 tab PO Day 7-9, then Half Tab PO Day 10-12     traMADol 50 MG tablet  Commonly known as:  ULTRAM  Take 50 mg by mouth 2 (two) times daily as needed. pain     TURMERIC PO  Take 2 tablets by mouth every evening.        Discharge Instructions:     Follow-up Information    Follow up with [facility] On 06/12/2014.   Specialty:  General Surgery   Why:  Doc of the Week  Clinic, 3:45pm, arrive no later than 3:15pm for paperwork   Contact information:   [address] [place] [address] [phone]       Signed: Henreitta Cea 05/25/2014, 8:48 AM

## 2014-05-25 NOTE — Progress Notes (Signed)
Discussed discharge summary with patient. Reviewed all medications with patient. Patient received Rx. Patient had no questions. Patient ready for discharge.

## 2014-05-25 NOTE — Discharge Instructions (Signed)
[facility] ______[facility] °LAPAROSCOPIC SURGERY: POST OP INSTRUCTIONS °Always review your discharge instruction sheet given to you by the facility where your surgery was performed. °IF YOU HAVE DISABILITY OR FAMILY LEAVE FORMS, YOU MUST BRING THEM TO THE OFFICE FOR PROCESSING.   °DO NOT GIVE THEM TO YOUR DOCTOR. ° °1. A prescription for pain medication may be given to you upon discharge.  Take your pain medication as prescribed, if needed.  If narcotic pain medicine is not needed, then you may take acetaminophen (Tylenol) or ibuprofen (Advil) as needed. °2. Take your usually prescribed medications unless otherwise directed. °3. If you need a refill on your pain medication, please contact your pharmacy.  They will contact our office to request authorization. Prescriptions will not be filled after 5pm or on week-ends. °4. You should follow a light diet the first few days after arrival home, such as soup and crackers, etc.  Be sure to include lots of fluids daily. °5. Most patients will experience some swelling and bruising in the area of the incisions.  Ice packs will help.  Swelling and bruising can take several days to resolve.  °6. It is common to experience some constipation if taking pain medication after surgery.  Increasing fluid intake and taking a stool softener (such as Colace) will usually help or prevent this problem from occurring.  A mild laxative (Milk of Magnesia or Miralax) should be taken according to package instructions if there are no bowel movements after 48 hours. °7. Unless discharge instructions indicate otherwise, you may remove your bandages 24-48 hours after surgery, and you may shower at that time.  You may have steri-strips (small skin tapes) in place directly over the incision.  These strips should be left on the skin for 7-10 days.  If your surgeon used skin glue on the incision, you may shower in 24 hours.  The glue will flake off over the next 2-3 weeks.  Any sutures or  staples will be removed at the office during your follow-up visit. °8. ACTIVITIES:  You may resume regular (light) daily activities beginning the next day--such as daily self-care, walking, climbing stairs--gradually increasing activities as tolerated.  You may have sexual intercourse when it is comfortable.  Refrain from any heavy lifting or straining until approved by your doctor. °a. You may drive when you are no longer taking prescription pain medication, you can comfortably wear a seatbelt, and you can safely maneuver your car and apply brakes. °b. RETURN TO WORK:  __________________________________________________________ °9. You should see your doctor in the office for a follow-up appointment approximately 2-3 weeks after your surgery.  Make sure that you call for this appointment within a day or two after you arrive home to insure a convenient appointment time. °10. OTHER INSTRUCTIONS: __________________________________________________________________________________________________________________________ __________________________________________________________________________________________________________________________ °WHEN TO CALL YOUR DOCTOR: °1. Fever over 101.0 °2. Inability to urinate °3. Continued bleeding from incision. °4. Increased pain, redness, or drainage from the incision. °5. Increasing abdominal pain ° °The clinic staff is available to answer your questions during regular business hours.  Please don’t hesitate to call and ask to speak to one of the nurses for clinical concerns.  If you have a medical emergency, go to the nearest emergency room or call 911.  A surgeon from [facility] is always on call at the hospital. °[address], [address], [place], [place]  [address] ? [address] [address], [place], [place]   [address] °[phone] ? [phone] ? FAX [phone] °Web site:   [email or web address] °

## 2014-05-25 NOTE — Progress Notes (Signed)
Cath intact at 37cm. Vaseline pressure gauze to site, pressure held x 25min. No bleeding to site. Pt instructed to keep dressing CDI x 24 hours. Avoid heavy lifting, pushing or pulling x 24 hours,  If bleeding occurs hold pressure, if bleeding does not stop contact MD or go to the ED. Pt does not have any questions. Catalina Pizza

## 2014-05-28 ENCOUNTER — Encounter (HOSPITAL_COMMUNITY): Payer: Self-pay | Admitting: Surgery

## 2015-01-22 MED FILL — OMEPRAZOLE DR 20 MG CAPSULE: 20 | 30 days supply | Qty: 60 | Fill #0

## 2015-01-22 MED FILL — HYDROCHLOROTHIAZIDE 25 MG T: 25 | 30 days supply | Qty: 30 | Fill #0

## 2015-02-07 MED FILL — CARVEDILOL 12.5 MG TABLET: 12.5 | 30 days supply | Qty: 60 | Fill #0

## 2015-02-14 MED FILL — HYDROCHLOROTHIAZIDE 25 MG T: 25 | 30 days supply | Qty: 30 | Fill #0

## 2015-02-14 MED FILL — OMEPRAZOLE DR 20 MG CAPSULE: 20 | 30 days supply | Qty: 60 | Fill #1

## 2015-02-26 DIAGNOSIS — I10 Essential (primary) hypertension: Secondary | ICD-10-CM | POA: Diagnosis not present

## 2015-02-26 DIAGNOSIS — Z0000 Encounter for general adult medical examination without abnormal findings: Secondary | ICD-10-CM | POA: Diagnosis not present

## 2015-02-26 DIAGNOSIS — E039 Hypothyroidism, unspecified: Secondary | ICD-10-CM | POA: Diagnosis not present

## 2015-02-26 DIAGNOSIS — E784 Other hyperlipidemia: Secondary | ICD-10-CM | POA: Diagnosis not present

## 2015-02-26 DIAGNOSIS — Z800 Family history of malignant neoplasm of digestive organs: Secondary | ICD-10-CM | POA: Diagnosis not present

## 2015-02-26 DIAGNOSIS — K219 Gastro-esophageal reflux disease without esophagitis: Secondary | ICD-10-CM | POA: Diagnosis not present

## 2015-02-26 DIAGNOSIS — M5137 Other intervertebral disc degeneration, lumbosacral region: Secondary | ICD-10-CM | POA: Diagnosis not present

## 2015-02-26 MED FILL — CYCLOBENZAPRINE 10 MG TAB: 10 | 30 days supply | Qty: 30 | Fill #0

## 2015-03-11 MED FILL — CARVEDILOL 12.5 MG TABLET: 12.5 | 30 days supply | Qty: 60 | Fill #1

## 2015-03-12 MED FILL — traMADol HCL 50 MG TABS: 50 | 30 days supply | Qty: 90 | Fill #0

## 2015-03-13 MED FILL — LEVOTHYROXINE 25 MCG TABLET: 25 | 90 days supply | Qty: 90 | Fill #0

## 2015-03-27 MED FILL — VALSARTAN 160 MG TABLET: 160 | 90 days supply | Qty: 90 | Fill #2

## 2015-03-27 MED FILL — OMEPRAZOLE DR 20 MG CAPSULE: 20 | 30 days supply | Qty: 60 | Fill #0

## 2015-03-27 MED FILL — AMLODIPINE BESYLATE 10 MG T: 10 | 90 days supply | Qty: 90 | Fill #2

## 2015-03-27 MED FILL — HYDROCHLOROTHIAZIDE 25 MG T: 25 | 30 days supply | Qty: 30 | Fill #1

## 2015-04-16 MED FILL — EZETIMIBE 10 MG TABLET: 10 | 90 days supply | Qty: 90 | Fill #0

## 2015-04-16 MED FILL — SIMVASTATIN 40 MG TABLET: 40 | 90 days supply | Qty: 90 | Fill #0

## 2015-04-22 MED FILL — CARVEDILOL 12.5 MG TABLET: 12.5 | 30 days supply | Qty: 60 | Fill #0

## 2015-04-29 MED FILL — OMEPRAZOLE DR 20 MG CAPSULE: 20 | 30 days supply | Qty: 60 | Fill #1

## 2015-04-30 MED FILL — CYCLOBENZAPRINE 10 MG TAB: 10 | 30 days supply | Qty: 30 | Fill #0

## 2015-04-30 MED FILL — HYDROCHLOROTHIAZIDE 25 MG T: 25 | 30 days supply | Qty: 30 | Fill #0

## 2015-05-21 MED FILL — traMADol HCL 50 MG TABS: 50 | 30 days supply | Qty: 90 | Fill #0

## 2015-05-31 MED FILL — CARVEDILOL 12.5 MG TABLET: 12.5 | 30 days supply | Qty: 60 | Fill #1

## 2015-06-03 MED FILL — HYDROCHLOROTHIAZIDE 25 MG T: 25 | 30 days supply | Qty: 30 | Fill #1

## 2015-06-18 ENCOUNTER — Other Ambulatory Visit: Payer: Self-pay

## 2015-06-18 DIAGNOSIS — Z1231 Encounter for screening mammogram for malignant neoplasm of breast: Secondary | ICD-10-CM

## 2015-06-19 MED FILL — OMEPRAZOLE DR 20 MG CAPSULE: 20 | 30 days supply | Qty: 60 | Fill #2

## 2015-06-19 MED FILL — LEVOTHYROXINE 25 MCG TABLET: 25 | 90 days supply | Qty: 90 | Fill #1

## 2015-06-25 ENCOUNTER — Ambulatory Visit
Admission: RE | Admit: 2015-06-25 | Discharge: 2015-06-25 | Disposition: A | Discharge status: Home / Self Care | Payer: 59 | Source: Ambulatory Visit

## 2015-06-25 DIAGNOSIS — Z1231 Encounter for screening mammogram for malignant neoplasm of breast: Secondary | ICD-10-CM

## 2015-07-08 MED FILL — VALSARTAN 160 MG TABLET: 160 | 90 days supply | Qty: 90 | Fill #0

## 2015-07-08 MED FILL — AMLODIPINE BESYLATE 10 MG T: 10 | 90 days supply | Qty: 90 | Fill #0

## 2015-07-08 MED FILL — HYDROCHLOROTHIAZIDE 25 MG T: 25 | 30 days supply | Qty: 30 | Fill #2

## 2015-07-11 MED FILL — CARVEDILOL 12.5 MG TABLET: 12.5 | 30 days supply | Qty: 60 | Fill #2

## 2015-07-19 MED FILL — traMADol HCL 50 MG TABS: 50 | 30 days supply | Qty: 90 | Fill #0

## 2015-07-22 MED FILL — OMEPRAZOLE DR 20 MG CAPSULE: 20 | 30 days supply | Qty: 60 | Fill #3

## 2015-08-14 MED FILL — HYDROCHLOROTHIAZIDE 25 MG T: 25 | 30 days supply | Qty: 30 | Fill #3

## 2015-08-14 MED FILL — EZETIMIBE 10 MG TABLET: 10 | 90 days supply | Qty: 90 | Fill #1

## 2015-08-14 MED FILL — SIMVASTATIN 40 MG TABLET: 40 | 90 days supply | Qty: 90 | Fill #1

## 2015-08-14 MED FILL — CARVEDILOL 12.5 MG TABLET: 12.5 | 30 days supply | Qty: 60 | Fill #3

## 2015-08-16 MED FILL — CYCLOBENZAPRINE 10 MG TAB: 10 | 30 days supply | Qty: 30 | Fill #0

## 2015-08-26 MED FILL — OMEPRAZOLE DR 20 MG CAPSULE: 20 | 30 days supply | Qty: 60 | Fill #4

## 2015-09-11 MED FILL — HYDROCHLOROTHIAZIDE 25 MG T: 25 | 30 days supply | Qty: 30 | Fill #4

## 2015-09-11 MED FILL — traMADol HCL 50 MG TABS: 50 | 30 days supply | Qty: 90 | Fill #0

## 2015-09-19 DIAGNOSIS — E039 Hypothyroidism, unspecified: Secondary | ICD-10-CM | POA: Diagnosis not present

## 2015-09-19 DIAGNOSIS — I10 Essential (primary) hypertension: Secondary | ICD-10-CM | POA: Diagnosis not present

## 2015-09-19 DIAGNOSIS — E784 Other hyperlipidemia: Secondary | ICD-10-CM | POA: Diagnosis not present

## 2015-09-24 MED FILL — CARVEDILOL 12.5 MG TABLET: 12.5 | 30 days supply | Qty: 60 | Fill #4

## 2015-10-04 MED FILL — OMEPRAZOLE DR 20 MG CAPSULE: 20 | 30 days supply | Qty: 60 | Fill #5

## 2015-10-21 MED FILL — CARVEDILOL 12.5 MG TABLET: 12.5 | 30 days supply | Qty: 60 | Fill #5

## 2015-10-21 MED FILL — VALSARTAN 160 MG TABLET: 160 | 90 days supply | Qty: 90 | Fill #1

## 2015-10-21 MED FILL — HYDROCHLOROTHIAZIDE 25 MG T: 25 | 30 days supply | Qty: 30 | Fill #0

## 2015-10-21 MED FILL — AMLODIPINE BESYLATE 10 MG T: 10 | 90 days supply | Qty: 90 | Fill #1

## 2015-10-29 MED FILL — LEVOTHYROXINE 25 MCG TABLET: 25 | 90 days supply | Qty: 90 | Fill #0

## 2015-11-05 MED FILL — OMEPRAZOLE DR 20 MG CAPSULE: 20 | 90 days supply | Qty: 180 | Fill #0

## 2015-11-12 MED FILL — traMADol HCL 50 MG TABS: 50 | 30 days supply | Qty: 90 | Fill #0

## 2015-11-19 MED FILL — SIMVASTATIN 40 MG TABLET: 40 | 90 days supply | Qty: 90 | Fill #2

## 2015-11-19 MED FILL — HYDROCHLOROTHIAZIDE 25 MG T: 25 | 30 days supply | Qty: 30 | Fill #1

## 2015-11-19 MED FILL — EZETIMIBE 10 MG TABLET: 10 | 90 days supply | Qty: 90 | Fill #2

## 2015-12-02 MED FILL — CARVEDILOL 12.5 MG TABLET: 12.5 | 30 days supply | Qty: 60 | Fill #0

## 2015-12-27 MED FILL — HYDROCHLOROTHIAZIDE 25 MG T: 25 | 90 days supply | Qty: 90 | Fill #2

## 2015-12-27 MED FILL — CARVEDILOL 12.5 MG TABLET: 12.5 | 90 days supply | Qty: 180 | Fill #1

## 2015-12-31 MED FILL — CYCLOBENZAPRINE 10 MG TAB: 10 | 30 days supply | Qty: 30 | Fill #0

## 2015-12-31 MED FILL — traMADol HCL 50 MG TABS: 50 | 30 days supply | Qty: 90 | Fill #0

## 2016-01-22 MED FILL — VALSARTAN 160 MG TABLET: 160 | 90 days supply | Qty: 90 | Fill #0

## 2016-01-22 MED FILL — AMLODIPINE BESYLATE 10 MG T: 10 | 90 days supply | Qty: 90 | Fill #0

## 2016-01-28 DIAGNOSIS — J209 Acute bronchitis, unspecified: Secondary | ICD-10-CM | POA: Diagnosis not present

## 2016-01-28 DIAGNOSIS — J069 Acute upper respiratory infection, unspecified: Secondary | ICD-10-CM | POA: Diagnosis not present

## 2016-01-28 DIAGNOSIS — I10 Essential (primary) hypertension: Secondary | ICD-10-CM | POA: Diagnosis not present

## 2016-01-28 MED FILL — PROMETHAZINE-CODEINE SYRUP: 6.25-10 | 6 days supply | Qty: 120 | Fill #0

## 2016-02-18 MED FILL — OMEPRAZOLE DR 20 MG CAPSULE: 20 | 90 days supply | Qty: 180 | Fill #1

## 2016-02-18 MED FILL — SIMVASTATIN 40 MG TABLET: 40 | 90 days supply | Qty: 90 | Fill #3

## 2016-02-18 MED FILL — LEVOTHYROXINE 25 MCG TABLET: 25 | 90 days supply | Qty: 90 | Fill #1

## 2016-03-03 MED FILL — EZETIMIBE 10 MG TABLET: 10 | 90 days supply | Qty: 90 | Fill #3

## 2016-03-05 MED FILL — traMADol HCL 50 MG TABS: 50 | 30 days supply | Qty: 90 | Fill #0

## 2016-04-13 MED FILL — HYDROCHLOROTHIAZIDE 25 MG T: 25 | 90 days supply | Qty: 90 | Fill #3

## 2016-04-20 MED FILL — CARVEDILOL 12.5 MG TABLET: 12.5 | 30 days supply | Qty: 60 | Fill #2

## 2016-04-30 MED FILL — CYCLOBENZAPRINE 10 MG TAB: 10 | 30 days supply | Qty: 30 | Fill #0

## 2016-04-30 MED FILL — traMADol HCL 50 MG TABS: 50 | 30 days supply | Qty: 90 | Fill #0

## 2016-05-09 ENCOUNTER — Encounter (HOSPITAL_COMMUNITY): Payer: Self-pay

## 2016-05-09 ENCOUNTER — Emergency Department (HOSPITAL_COMMUNITY)
Admission: EM | Admit: 2016-05-09 | Discharge: 2016-05-09 | Disposition: A | Discharge status: Home / Self Care | Payer: 59 | Attending: Emergency Medicine | Admitting: Emergency Medicine

## 2016-05-09 DIAGNOSIS — I10 Essential (primary) hypertension: Secondary | ICD-10-CM | POA: Insufficient documentation

## 2016-05-09 DIAGNOSIS — Z7982 Long term (current) use of aspirin: Secondary | ICD-10-CM | POA: Insufficient documentation

## 2016-05-09 DIAGNOSIS — K625 Hemorrhage of anus and rectum: Secondary | ICD-10-CM | POA: Insufficient documentation

## 2016-05-09 DIAGNOSIS — Z79899 Other long term (current) drug therapy: Secondary | ICD-10-CM | POA: Diagnosis not present

## 2016-05-09 DIAGNOSIS — B9689 Other specified bacterial agents as the cause of diseases classified elsewhere: Secondary | ICD-10-CM

## 2016-05-09 DIAGNOSIS — N939 Abnormal uterine and vaginal bleeding, unspecified: Secondary | ICD-10-CM | POA: Diagnosis present

## 2016-05-09 DIAGNOSIS — N760 Acute vaginitis: Secondary | ICD-10-CM | POA: Diagnosis not present

## 2016-05-09 LAB — WET PREP, GENITAL
SPERM: NONE SEEN
Trich, Wet Prep: NONE SEEN
Yeast Wet Prep HPF POC: NONE SEEN

## 2016-05-09 LAB — URINALYSIS, ROUTINE W REFLEX MICROSCOPIC
Bilirubin Urine: NEGATIVE
Glucose, UA: NEGATIVE mg/dL
Hgb urine dipstick: NEGATIVE
Ketones, ur: NEGATIVE mg/dL
LEUKOCYTES UA: NEGATIVE
Nitrite: NEGATIVE
PH: 5 (ref 5.0–8.0)
Protein, ur: NEGATIVE mg/dL
SPECIFIC GRAVITY, URINE: 1.02 (ref 1.005–1.030)

## 2016-05-09 LAB — POC OCCULT BLOOD, ED: Fecal Occult Bld: POSITIVE — AB

## 2016-05-09 MED ORDER — METRONIDAZOLE 500 MG PO TABS: 500.0000 mg | ORAL_TABLET | Freq: Two times a day (BID) | ORAL | 0 refills | Status: DC

## 2016-05-09 NOTE — ED Provider Notes (Signed)
[facility] DEPT Provider Note   CSN: [id number] Arrival date & time: 05/09/16  8341     History   Chief Complaint Chief Complaint  Patient presents with  . pelvic pain/ vaginal bleeding    HPI Theresa Nolan is a 61 y.o. female.  61 yo F with a chief complaint of vaginal bleeding. The started this morning. Patient having scant bleeding. Some pressure in her pelvis. States it feels like when she is about to ovulate. Denies fevers denies vomiting denies diarrhea. Past couple clots early this morning. History of hysterectomy about 10 years ago.   The history is provided by the patient.  Vaginal Bleeding  Primary symptoms include pelvic pain, vaginal bleeding.  Primary symptoms include no dysuria. There has been no fever. This is a new problem. The current episode started 3 to 5 hours ago. The problem occurs constantly. The problem has not changed since onset.The symptoms occur spontaneously. She is not pregnant. LMP: hysterectomy 10 years ago. The discharge was normal. Pertinent negatives include no nausea, no vomiting and no dizziness. She has tried nothing for the symptoms. The treatment provided no relief. Sexual activity: non-contributory. There is no concern regarding sexually transmitted diseases.    Past Medical History:  Diagnosis Date  . Chronic back pain   . Degenerative joint disease   . Hypercholesteremia   . Hypertension   . Lacunar infarction ([place])   . TSH elevation     Patient Active Problem List   Diagnosis Date Noted  . Cholecystitis 05/23/2014  . Acute cholecystitis   . HYPERLIPIDEMIA-MIXED 07/17/2009  . HYPERTENSION, BENIGN 07/17/2009  . SYNCOPE AND COLLAPSE 07/17/2009  . EDEMA 07/17/2009    Past Surgical History:  Procedure Laterality Date  . ABDOMINAL HYSTERECTOMY     ovaries remain   . CHOLECYSTECTOMY N/A 05/24/2014   Procedure: LAPAROSCOPIC CHOLECYSTECTOMY WITH INTRAOPERATIVE CHOLANGIOGRAM;  Surgeon: Coralie Keens, MD;  Location: [facility];   Service: General;  Laterality: N/A;    OB History    No data available       Home Medications    Prior to Admission medications   Medication Sig Start Date End Date Taking? Authorizing Provider  amLODipine-valsartan (EXFORGE) 10-160 MG per tablet Take 1 tablet by mouth every evening.     Historical Provider, MD  aspirin EC 81 MG tablet Take 81 mg by mouth daily.    Historical Provider, MD  carvedilol (COREG) 12.5 MG tablet Take 12.5 mg by mouth 2 (two) times daily with a meal.    Historical Provider, MD  cholecalciferol (VITAMIN D) 1000 UNITS tablet Take 1,000 Units by mouth daily.    Historical Provider, MD  co-enzyme Q-10 30 MG capsule Take 30 mg by mouth every evening.    Historical Provider, MD  Cyanocobalamin (B-12 SL) Place 1 tablet under the tongue as needed (anemia).    Historical Provider, MD  ezetimibe-simvastatin (VYTORIN) 10-20 MG per tablet Take 1 tablet by mouth every evening.     Historical Provider, MD  hydrochlorothiazide (HYDRODIURIL) 25 MG tablet Take 25 mg by mouth every evening.     Historical Provider, MD  levothyroxine (SYNTHROID, LEVOTHROID) 25 MCG tablet Take 25 mcg by mouth daily.    Historical Provider, MD  Menthol-Methyl Salicylate (MUSCLE RUB) 10-15 % CREA Apply 1 application topically as needed for muscle pain.    Historical Provider, MD  metroNIDAZOLE (FLAGYL) 500 MG tablet Take 1 tablet (500 mg total) by mouth 2 (two) times daily. 05/09/16   Deno Etienne, DO  naproxen sodium (ANAPROX) 220 MG tablet Take 220-440 mg by mouth 2 (two) times daily as needed (pain).    Historical Provider, MD  Omega-3 Fatty Acids (FISH OIL) 1000 MG CAPS Take 1 capsule by mouth daily.    Historical Provider, MD  omeprazole (PRILOSEC OTC) 20 MG tablet Take 20 mg by mouth 2 (two) times daily.    Historical Provider, MD  oxyCODONE-acetaminophen (PERCOCET/ROXICET) 5-325 MG per tablet Take 1-2 tablets by mouth every 4 (four) hours as needed for moderate pain. 05/25/14   Saverio Danker, PA-C    predniSONE (DELTASONE) 20 MG tablet 3 Tabs PO Days 1-3, then 2 tabs PO Days 4-6, then 1 tab PO Day 7-9, then Half Tab PO Day 10-12 Patient not taking: Reported on 05/23/2014 05/17/12   Carlisle Cater, PA-C  traMADol (ULTRAM) 50 MG tablet Take 50 mg by mouth 2 (two) times daily as needed. pain    Historical Provider, MD  TURMERIC PO Take 2 tablets by mouth every evening.    Historical Provider, MD    Family History No family history on file.  Social History Social History  Substance Use Topics  . Smoking status: Never Smoker  . Smokeless tobacco: Never Used  . Alcohol use 0.6 oz/week    1 Glasses of wine per week     Comment: occasionally      Allergies   Lisinopril   Review of Systems Review of Systems  Constitutional: Negative for chills and fever.  HENT: Negative for congestion and rhinorrhea.   Eyes: Negative for redness and visual disturbance.  Respiratory: Negative for shortness of breath and wheezing.   Cardiovascular: Negative for chest pain and palpitations.  Gastrointestinal: Negative for nausea and vomiting.  Genitourinary: Positive for pelvic pain and vaginal bleeding. Negative for dysuria and urgency.  Musculoskeletal: Negative for arthralgias and myalgias.  Skin: Negative for pallor and wound.  Neurological: Negative for dizziness and headaches.     Physical Exam Updated Vital Signs BP 136/63   Pulse 89   Temp 98 F (36.7 C)   Resp 18   SpO2 100%   Physical Exam  Constitutional: She is oriented to person, place, and time. She appears well-developed and well-nourished. No distress.  HENT:  Head: Normocephalic and atraumatic.  Eyes: EOM are normal. Pupils are equal, round, and reactive to light.  Neck: Normal range of motion. Neck supple.  Cardiovascular: Normal rate and regular rhythm.  Exam reveals no gallop and no friction rub.   No murmur heard. Pulmonary/Chest: Effort normal. She has no wheezes. She has no rales.  Abdominal: Soft. She exhibits no  distension and no mass. There is no tenderness. There is no guarding.  Genitourinary:  Genitourinary Comments: Thick whitish discharge in the vault. No bleeding. Rectal exam with no gross blood in stool. Small hemorrhoid nonbleeding  Musculoskeletal: She exhibits no edema or tenderness.  Neurological: She is alert and oriented to person, place, and time.  Skin: Skin is warm and dry. She is not diaphoretic.  Psychiatric: She has a normal mood and affect. Her behavior is normal.  Nursing note and vitals reviewed.    ED Treatments / Results  Labs (all labs ordered are listed, but only abnormal results are displayed) Labs Reviewed  WET PREP, GENITAL - Abnormal; Notable for the following:       Result Value   Clue Cells Wet Prep HPF POC PRESENT (*)    WBC, Wet Prep HPF POC MANY (*)    All other components within  normal limits  POC OCCULT BLOOD, ED - Abnormal; Notable for the following:    Fecal Occult Bld POSITIVE (*)    All other components within normal limits  URINALYSIS, ROUTINE W REFLEX MICROSCOPIC  GC/CHLAMYDIA PROBE AMP ([employer]) NOT AT [facility]    EKG  EKG Interpretation None       Radiology No results found.  Procedures Procedures (including critical care time)  Medications Ordered in ED Medications - No data to display   Initial Impression / Assessment and Plan / ED Course  I have reviewed the triage vital signs and the nursing notes.  Pertinent labs & imaging results that were available during my care of the patient were reviewed by me and considered in my medical decision making (see chart for details).     61 yo F With a chief complaint of vaginal bleeding. Will do pelvic exam.  No visualized source of bleeding on exam. UA without blood. I'm unsure of the etiology of the patient's bleeding. Suggested follow-up with GYN.    Heme + guac.  Well appearing non toxic.  Only one event this morning.  PCP follow up with likely need to see GI.  Return for  worsening.    12:16 PM:  I have discussed the diagnosis/risks/treatment options with the patient and family and believe the pt to be eligible for discharge home to follow-up with PCP, GI. We also discussed returning to the ED immediately if new or worsening sx occur. We discussed the sx which are most concerning (e.g., sudden worsening pain, fever, inability to tolerate by mouth) that necessitate immediate return. Medications administered to the patient during their visit and any new prescriptions provided to the patient are listed below.  Medications given during this visit Medications - No data to display   The patient appears reasonably screen and/or stabilized for discharge and I doubt any other medical condition or other [facility] requiring further screening, evaluation, or treatment in the ED at this time prior to discharge.   Final Clinical Impressions(s) / ED Diagnoses   Final diagnoses:  BV (bacterial vaginosis)  Rectal bleeding    New Prescriptions Discharge Medication List as of 05/09/2016 10:59 AM       Deno Etienne, DO 05/09/16 1216

## 2016-05-09 NOTE — Discharge Instructions (Signed)
Follow up with your GYN to eval for other places of bleeding

## 2016-05-09 NOTE — ED Triage Notes (Signed)
Patient complains of pelvic pain for several days with light vaginal bleeding today. States that she had hysterectomy several years ago. Denies dysuria. Alert and oriented. Has taken tramadol and naprosyn this am

## 2016-05-11 LAB — GC/CHLAMYDIA PROBE AMP (~~LOC~~) NOT AT ARMC
Chlamydia: NEGATIVE
Neisseria Gonorrhea: NEGATIVE

## 2016-05-11 MED FILL — VALSARTAN 160 MG TABLET: 160 | 90 days supply | Qty: 90 | Fill #0

## 2016-05-11 MED FILL — AMLODIPINE BESYLATE 10 MG T: 10 | 90 days supply | Qty: 90 | Fill #0

## 2016-05-12 DIAGNOSIS — K219 Gastro-esophageal reflux disease without esophagitis: Secondary | ICD-10-CM | POA: Diagnosis not present

## 2016-05-12 DIAGNOSIS — K625 Hemorrhage of anus and rectum: Secondary | ICD-10-CM | POA: Diagnosis not present

## 2016-05-12 DIAGNOSIS — Z1211 Encounter for screening for malignant neoplasm of colon: Secondary | ICD-10-CM | POA: Diagnosis not present

## 2016-05-12 MED FILL — metroNIDAZOLE 500 MG TABS: 500 | 7 days supply | Qty: 14 | Fill #0

## 2016-05-14 MED FILL — GAVILYTE-G SOLUTION: 236 | 1 days supply | Qty: 4000 | Fill #0

## 2016-05-18 DIAGNOSIS — K625 Hemorrhage of anus and rectum: Secondary | ICD-10-CM | POA: Diagnosis not present

## 2016-05-18 DIAGNOSIS — Z1211 Encounter for screening for malignant neoplasm of colon: Secondary | ICD-10-CM | POA: Diagnosis not present

## 2016-05-18 MED FILL — GAVILYTE-G SOLUTION: 236 | 1 days supply | Qty: 4000 | Fill #0

## 2016-05-20 DIAGNOSIS — K5730 Diverticulosis of large intestine without perforation or abscess without bleeding: Secondary | ICD-10-CM | POA: Diagnosis not present

## 2016-05-20 DIAGNOSIS — D125 Benign neoplasm of sigmoid colon: Secondary | ICD-10-CM | POA: Diagnosis not present

## 2016-05-20 DIAGNOSIS — K635 Polyp of colon: Secondary | ICD-10-CM | POA: Diagnosis not present

## 2016-05-20 DIAGNOSIS — Z1211 Encounter for screening for malignant neoplasm of colon: Secondary | ICD-10-CM | POA: Diagnosis not present

## 2016-05-28 MED FILL — CARVEDILOL 12.5 MG TABLET: 12.5 | 30 days supply | Qty: 60 | Fill #0

## 2016-05-28 MED FILL — OMEPRAZOLE DR 20 MG CAPSULE: 20 | 30 days supply | Qty: 60 | Fill #0

## 2016-06-02 DIAGNOSIS — M5137 Other intervertebral disc degeneration, lumbosacral region: Secondary | ICD-10-CM | POA: Diagnosis not present

## 2016-06-02 DIAGNOSIS — E784 Other hyperlipidemia: Secondary | ICD-10-CM | POA: Diagnosis not present

## 2016-06-02 DIAGNOSIS — Z0000 Encounter for general adult medical examination without abnormal findings: Secondary | ICD-10-CM | POA: Diagnosis not present

## 2016-06-02 DIAGNOSIS — K219 Gastro-esophageal reflux disease without esophagitis: Secondary | ICD-10-CM | POA: Diagnosis not present

## 2016-06-02 DIAGNOSIS — I10 Essential (primary) hypertension: Secondary | ICD-10-CM | POA: Diagnosis not present

## 2016-06-02 DIAGNOSIS — E039 Hypothyroidism, unspecified: Secondary | ICD-10-CM | POA: Diagnosis not present

## 2016-06-09 MED FILL — LEVOTHYROXINE 25 MCG TABLET: 25 | 90 days supply | Qty: 90 | Fill #0

## 2016-06-09 MED FILL — EZETIMIBE 10 MG TAB: 10 | 90 days supply | Qty: 90 | Fill #0

## 2016-06-09 MED FILL — SIMVASTATIN 40 MG TABLET: 40 | 90 days supply | Qty: 90 | Fill #0

## 2016-06-21 MED FILL — CARVEDILOL 12.5 MG TABLET: 12.5 | 90 days supply | Qty: 180 | Fill #0

## 2016-06-21 MED FILL — OMEPRAZOLE DR 20 MG CAPSULE: 20 | 90 days supply | Qty: 180 | Fill #0

## 2016-06-23 MED FILL — traMADol HCL 50 MG TABS: 50 | 30 days supply | Qty: 90 | Fill #0

## 2016-07-28 DIAGNOSIS — E2839 Other primary ovarian failure: Secondary | ICD-10-CM | POA: Diagnosis not present

## 2016-07-28 DIAGNOSIS — Z780 Asymptomatic menopausal state: Secondary | ICD-10-CM | POA: Diagnosis not present

## 2016-07-31 MED FILL — HYDROCHLOROTHIAZIDE 25 MG T: 25 | 90 days supply | Qty: 90 | Fill #0

## 2016-08-24 MED FILL — AMLODIPINE BESYLATE 10 MG T: 10 | 90 days supply | Qty: 90 | Fill #0

## 2016-08-25 MED FILL — CYCLOBENZAPRINE 10 MG TAB: 10 | 30 days supply | Qty: 30 | Fill #1

## 2016-08-25 MED FILL — traMADol HCL 50 MG TABS: 50 | 30 days supply | Qty: 90 | Fill #0

## 2016-08-25 MED FILL — LOSARTAN POTASSIUM 100 MG T: 100 | 30 days supply | Qty: 30 | Fill #0

## 2016-09-28 MED FILL — LOSARTAN POTASSIUM 100 MG T: 100 | 30 days supply | Qty: 30 | Fill #1

## 2016-09-28 MED FILL — LEVOTHYROXINE 25 MCG TABLET: 25 | 90 days supply | Qty: 90 | Fill #1

## 2016-09-28 MED FILL — SIMVASTATIN 40 MG TABLET: 40 | 90 days supply | Qty: 90 | Fill #1

## 2016-10-19 MED FILL — EZETIMIBE 10 MG TAB: 10 | 90 days supply | Qty: 90 | Fill #1

## 2016-10-19 MED FILL — traMADol HCL 50 MG TABS: 50 | 30 days supply | Qty: 90 | Fill #0

## 2016-10-19 MED FILL — CARVEDILOL 12.5 MG TABLET: 12.5 | 90 days supply | Qty: 180 | Fill #1

## 2016-11-09 MED FILL — OMEPRAZOLE 20 MG CAP: 20 | 90 days supply | Qty: 180 | Fill #1

## 2016-11-09 MED FILL — LOSARTAN POTASSIUM 100 MG T: 100 | 90 days supply | Qty: 90 | Fill #2

## 2016-11-09 MED FILL — HYDROCHLOROTHIAZIDE 25 MG T: 25 | 90 days supply | Qty: 90 | Fill #1

## 2016-12-17 MED FILL — AMLODIPINE BESYLATE 10 MG T: 10 | 90 days supply | Qty: 90 | Fill #1

## 2016-12-17 MED FILL — traMADol HCL 50 MG TABS: 50 | 30 days supply | Qty: 90 | Fill #0

## 2017-01-18 MED FILL — CARVEDILOL 12.5 MG TABLET: 12.5 | 90 days supply | Qty: 180 | Fill #2

## 2017-01-18 MED FILL — LEVOTHYROXINE 25 MCG TABLET: 25 | 90 days supply | Qty: 90 | Fill #0

## 2017-01-18 MED FILL — SIMVASTATIN 40 MG TABLET: 40 | 90 days supply | Qty: 90 | Fill #2

## 2017-01-26 MED FILL — EZETIMIBE 10 MG TABS: 10 | 90 days supply | Qty: 90 | Fill #2

## 2017-02-10 MED FILL — OMEPRAZOLE 20 MG CAP: 20 | 90 days supply | Qty: 180 | Fill #0

## 2017-02-22 MED FILL — HYDROCHLOROTHIAZIDE 25 MG T: 25 | 90 days supply | Qty: 90 | Fill #2

## 2017-02-22 MED FILL — LOSARTAN POTASSIUM 100 MG T: 100 | 90 days supply | Qty: 90 | Fill #3

## 2017-02-23 MED FILL — traMADol HCL 50 MG TABS: 50 | 30 days supply | Qty: 90 | Fill #0

## 2017-03-29 MED FILL — AMLODIPINE BESYLATE 10 MG T: 10 | 90 days supply | Qty: 90 | Fill #2

## 2017-04-12 MED FILL — traMADol HCL 50 MG TABS: 50 | 30 days supply | Qty: 90 | Fill #0

## 2017-04-15 MED FILL — CYCLOBENZAPRINE 10 MG TAB: 10 | 30 days supply | Qty: 30 | Fill #0

## 2017-04-22 MED FILL — EZETIMIBE 10 MG TABLET: 10 | 90 days supply | Qty: 90 | Fill #3

## 2017-04-22 MED FILL — SIMVASTATIN 40 MG TABLET: 40 | 90 days supply | Qty: 90 | Fill #3

## 2017-04-22 MED FILL — LEVOTHYROXINE 25 MCG TABLET: 25 | 90 days supply | Qty: 90 | Fill #1

## 2017-05-11 MED FILL — CARVEDILOL 12.5 MG TABLET: 12.5 | 90 days supply | Qty: 180 | Fill #3

## 2017-05-21 MED FILL — LOSARTAN POTASSIUM 100 MG T: 100 | 90 days supply | Qty: 90 | Fill #4

## 2017-05-21 MED FILL — HYDROCHLOROTHIAZIDE 25 MG T: 25 | 90 days supply | Qty: 90 | Fill #3

## 2017-05-21 MED FILL — OMEPRAZOLE 20 MG CAP: 20 | 45 days supply | Qty: 90 | Fill #0

## 2017-06-03 ENCOUNTER — Ambulatory Visit (HOSPITAL_COMMUNITY)
Admission: RE | Admit: 2017-06-03 | Discharge: 2017-06-03 | Disposition: A | Discharge status: Home / Self Care | Payer: 59 | Source: Ambulatory Visit | Attending: Family Medicine | Admitting: Family Medicine

## 2017-06-03 ENCOUNTER — Other Ambulatory Visit (HOSPITAL_COMMUNITY): Payer: Self-pay | Admitting: Family Medicine

## 2017-06-03 DIAGNOSIS — M5137 Other intervertebral disc degeneration, lumbosacral region: Secondary | ICD-10-CM | POA: Diagnosis not present

## 2017-06-03 DIAGNOSIS — M84477A Pathological fracture, right toe(s), initial encounter for fracture: Secondary | ICD-10-CM | POA: Diagnosis not present

## 2017-06-03 DIAGNOSIS — M79671 Pain in right foot: Secondary | ICD-10-CM | POA: Diagnosis not present

## 2017-06-03 DIAGNOSIS — I10 Essential (primary) hypertension: Secondary | ICD-10-CM | POA: Diagnosis not present

## 2017-06-03 DIAGNOSIS — Z0000 Encounter for general adult medical examination without abnormal findings: Secondary | ICD-10-CM | POA: Diagnosis not present

## 2017-06-03 DIAGNOSIS — K219 Gastro-esophageal reflux disease without esophagitis: Secondary | ICD-10-CM | POA: Diagnosis not present

## 2017-06-03 DIAGNOSIS — S92355A Nondisplaced fracture of fifth metatarsal bone, left foot, initial encounter for closed fracture: Secondary | ICD-10-CM | POA: Diagnosis not present

## 2017-06-03 DIAGNOSIS — E7841 Elevated Lipoprotein(a): Secondary | ICD-10-CM | POA: Diagnosis not present

## 2017-06-03 DIAGNOSIS — M659 Synovitis and tenosynovitis, unspecified: Secondary | ICD-10-CM | POA: Diagnosis not present

## 2017-06-03 DIAGNOSIS — E039 Hypothyroidism, unspecified: Secondary | ICD-10-CM | POA: Diagnosis not present

## 2017-06-08 ENCOUNTER — Ambulatory Visit (INDEPENDENT_AMBULATORY_CARE_PROVIDER_SITE_OTHER): Payer: 59 | Admitting: Orthopaedic Surgery

## 2017-06-08 ENCOUNTER — Encounter (INDEPENDENT_AMBULATORY_CARE_PROVIDER_SITE_OTHER): Payer: Self-pay | Admitting: Orthopaedic Surgery

## 2017-06-08 DIAGNOSIS — S92301A Fracture of unspecified metatarsal bone(s), right foot, initial encounter for closed fracture: Secondary | ICD-10-CM

## 2017-06-08 NOTE — Progress Notes (Signed)
Office Visit Note   Patient: Theresa Nolan           Date of Birth: 1955/03/24           MRN: [id number] Visit Date: 06/08/2017              Requested by: Kelton Pillar, MD [address] [place], [place] [address] PCP: Patient, No Pcp Per   Assessment & Plan: Visit Diagnoses:  1. Closed fracture of shaft of metatarsal bone of right foot, initial encounter     Plan: We will see her back in about 2 weeks obtain 3 views of the right foot.  Encouraged elevation.  She is to wear the postop shoe when up ambulating.  She may require a insert for shoes to offload her lateral columns in the future.  Elevation encouraged.  Follow-Up Instructions: Return in about 2 weeks (around 06/23/2017).   Orders:  No orders of the defined types were placed in this encounter.  No orders of the defined types were placed in this encounter.     Procedures: No procedures performed   Clinical Data: No additional findings.   Subjective: Chief Complaint  Patient presents with  . Right Foot - Pain    HPI HPI Theresa Nolan is a 62 year old female comes in today for right foot fracture.  She reports little over 2-1/2 weeks ago she felt odd sensation in her right foot as if her sock was bunched up.  She removed her shoe and the sock was not bunched up.  But she back on started developing pain and swelling in the foot.  Discussed this with her primary care physician who obtained x-rays showed a nondisplaced right fifth metatarsal midshaft fracture.  She comes here today for definitive treatment.  Is been in a regular shoe.  She does work as a Marine scientist at [employer] and is continue to work despite the pain.  She denies any particular injury to the foot. Radiographs dated 06/03/2017 on a cane system show a minimally displaced fifth metatarsal shaft fracture.  No other acute fractures.  Midfoot arthritic changes are noted.  No Lisfranc injury.  Haglund's deformity was present.  Review of  Systems Please see HPI otherwise negative  Objective: Vital Signs: There were no vitals taken for this visit.  Physical Exam  Constitutional: She is oriented to person, place, and time. She appears well-developed and well-nourished. No distress.  Cardiovascular: Intact distal pulses.  Pulmonary/Chest: Effort normal.  Neurological: She is alert and oriented to person, place, and time.  Skin: She is not diaphoretic.  Psychiatric: She has a normal mood and affect.    Ortho Exam Right foot no rashes skin lesions ulcerations or impending ulcers.  No ecchymosis.  She has swelling both feet slightly more edema in the right foot compared to left.  Calves are supple nontender.  She has tenderness over the fourth and fifth right metatarsals.  Remainder of both feet otherwise nontender. Specialty Comments:  No specialty comments available.  Imaging: No results found.   PMFS History: Patient Active Problem List   Diagnosis Date Noted  . Cholecystitis 05/23/2014  . Acute cholecystitis   . HYPERLIPIDEMIA-MIXED 07/17/2009  . HYPERTENSION, BENIGN 07/17/2009  . SYNCOPE AND COLLAPSE 07/17/2009  . EDEMA 07/17/2009   Past Medical History:  Diagnosis Date  . Chronic back pain   . Degenerative joint disease   . Hypercholesteremia   . Hypertension   . Lacunar infarction ([place])   .  TSH elevation     No family history on file.  Past Surgical History:  Procedure Laterality Date  . ABDOMINAL HYSTERECTOMY     ovaries remain   . CHOLECYSTECTOMY N/A 05/24/2014   Procedure: LAPAROSCOPIC CHOLECYSTECTOMY WITH INTRAOPERATIVE CHOLANGIOGRAM;  Surgeon: Coralie Keens, MD;  Location: [facility];  Service: General;  Laterality: N/A;   Social History   Occupational History  . Not on file  Tobacco Use  . Smoking status: Never Smoker  . Smokeless tobacco: Never Used  Substance and Sexual Activity  . Alcohol use: Yes    Alcohol/week: 0.6 oz    Types: 1 Glasses of wine per week    Comment: occasionally     . Drug use: No  . Sexual activity: Not on file

## 2017-06-10 MED FILL — traMADol HCL 50 MG TABS: 50 | 30 days supply | Qty: 90 | Fill #0

## 2017-06-11 ENCOUNTER — Other Ambulatory Visit: Payer: Self-pay | Admitting: Family Medicine

## 2017-06-11 DIAGNOSIS — Z1231 Encounter for screening mammogram for malignant neoplasm of breast: Secondary | ICD-10-CM

## 2017-06-22 ENCOUNTER — Ambulatory Visit (INDEPENDENT_AMBULATORY_CARE_PROVIDER_SITE_OTHER): Payer: 59 | Admitting: Orthopaedic Surgery

## 2017-06-22 ENCOUNTER — Ambulatory Visit (INDEPENDENT_AMBULATORY_CARE_PROVIDER_SITE_OTHER): Payer: 59

## 2017-06-22 ENCOUNTER — Encounter (INDEPENDENT_AMBULATORY_CARE_PROVIDER_SITE_OTHER): Payer: Self-pay | Admitting: Orthopaedic Surgery

## 2017-06-22 DIAGNOSIS — S92301D Fracture of unspecified metatarsal bone(s), right foot, subsequent encounter for fracture with routine healing: Secondary | ICD-10-CM | POA: Diagnosis not present

## 2017-06-22 NOTE — Progress Notes (Signed)
The patient is here for follow-up after having sustained a right foot fifth metatarsal shaft fracture.  She is ambulating with a postop shoe.  She states she is doing better and she feels like she is getting there but she still has some pain and soreness in the foot.  She is on her feet quite a bit.  On exam she does have some pain still palpating the fifth metatarsal on her right foot.  There is no gross deformities that I can see but there is definitely swelling still.  There is pain at the fracture site itself.  3 views of her right for obtained and there is abundant callus formation of the midshaft of the fifth metatarsal.  There is slight angulation to the fracture on the AP view but the lateral view is well-maintained.  Again there is interval healing.  At this point she will wear the postop shoe for at least 2 more weeks but can wear it longer if she needs it from a comfort standpoint and a swelling standpoint.  She will continue to weight-bear as tolerated but no high impact aerobic activities.  We will see her back in 4 weeks with a repeat 3 views of her right foot.  All questions and concerns were answered and addressed.

## 2017-06-29 ENCOUNTER — Ambulatory Visit
Admission: RE | Admit: 2017-06-29 | Discharge: 2017-06-29 | Disposition: A | Discharge status: Home / Self Care | Payer: 59 | Source: Ambulatory Visit | Attending: Family Medicine | Admitting: Family Medicine

## 2017-06-29 DIAGNOSIS — Z1231 Encounter for screening mammogram for malignant neoplasm of breast: Secondary | ICD-10-CM

## 2017-06-29 MED FILL — AMLODIPINE BESYLATE 10 MG T: 10 | 90 days supply | Qty: 90 | Fill #0

## 2017-07-06 ENCOUNTER — Ambulatory Visit: Payer: 59

## 2017-07-08 MED FILL — OMEPRAZOLE 20 MG CAP: 20 | 90 days supply | Qty: 180 | Fill #0

## 2017-07-26 MED FILL — LEVOTHYROXINE 25 MCG TABLET: 25 | 90 days supply | Qty: 90 | Fill #0

## 2017-07-27 ENCOUNTER — Encounter (INDEPENDENT_AMBULATORY_CARE_PROVIDER_SITE_OTHER): Payer: Self-pay | Admitting: Orthopaedic Surgery

## 2017-07-27 ENCOUNTER — Ambulatory Visit (INDEPENDENT_AMBULATORY_CARE_PROVIDER_SITE_OTHER): Payer: 59 | Admitting: Orthopaedic Surgery

## 2017-07-27 ENCOUNTER — Ambulatory Visit (INDEPENDENT_AMBULATORY_CARE_PROVIDER_SITE_OTHER): Payer: 59

## 2017-07-27 DIAGNOSIS — S92301D Fracture of unspecified metatarsal bone(s), right foot, subsequent encounter for fracture with routine healing: Secondary | ICD-10-CM

## 2017-07-27 NOTE — Progress Notes (Signed)
++                     The patient is almost 2 months status post 1/5 metatarsal shaft fracture of her right foot.  She is back to wearing tennis shoes now but is not comfortable with wearing heels.  She is 62 years old.  She is not a smoker not a diabetic.  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  She does report some foot pain still.  On exam I can stress fracture site and her pain is minimal with no motion at the fracture site.  Her foot is neurovascularly intact.  The skin is intact.  3 views of the right foot are obtained and reviewed to previous films show abundant callus formation around the metatarsal shaft fracture with minimal malalignment.  Overall looks like there is abundant healing.  At this point we do not need x-ray her foot again.  She understands this can take still another month to two months to feel better overall and she should increase her activities as comfort allows and change shoewear as comfort allows.  All questions concerns were answered and addressed.  Follow-up will be as needed at this point.

## 2017-08-05 MED FILL — traMADol HCL 50 MG TABS: 50 | 30 days supply | Qty: 90 | Fill #0

## 2017-08-09 MED FILL — SIMVASTATIN 40 MG TABLET: 40 | 90 days supply | Qty: 90 | Fill #0

## 2017-08-09 MED FILL — CARVEDILOL 12.5 MG TABLET: 12.5 | 90 days supply | Qty: 180 | Fill #0

## 2017-08-12 MED FILL — LOSARTAN POTASSIUM 100 MG T: 100 | 60 days supply | Qty: 60 | Fill #5

## 2017-08-31 MED FILL — HYDROCHLOROTHIAZIDE 25 MG T: 25 | 90 days supply | Qty: 90 | Fill #0

## 2017-09-02 MED FILL — EZETIMIBE 10 MG TABLET: 10 | 90 days supply | Qty: 90 | Fill #0

## 2017-09-14 MED FILL — CYCLOBENZAPRINE 10 MG TAB: 10 | 30 days supply | Qty: 30 | Fill #1

## 2017-09-28 MED FILL — traMADol HCL 50 MG TABS: 50 | 30 days supply | Qty: 90 | Fill #0

## 2017-10-21 MED FILL — OMEPRAZOLE 20 MG CPDR: 20 | 90 days supply | Qty: 180 | Fill #1

## 2017-10-21 MED FILL — LEVOTHYROXINE 25 MCG TABLET: 25 | 90 days supply | Qty: 90 | Fill #1

## 2017-10-21 MED FILL — AMLODIPINE BESYLATE 10 MG T: 10 | 90 days supply | Qty: 90 | Fill #1

## 2017-11-15 MED FILL — LOSARTAN POTASSIUM 100 MG T: 100 | 30 days supply | Qty: 30 | Fill #0

## 2017-11-16 ENCOUNTER — Ambulatory Visit (INDEPENDENT_AMBULATORY_CARE_PROVIDER_SITE_OTHER): Payer: 59 | Admitting: Orthopaedic Surgery

## 2017-11-16 ENCOUNTER — Ambulatory Visit (INDEPENDENT_AMBULATORY_CARE_PROVIDER_SITE_OTHER): Payer: 59

## 2017-11-16 ENCOUNTER — Encounter (INDEPENDENT_AMBULATORY_CARE_PROVIDER_SITE_OTHER): Payer: Self-pay | Admitting: Orthopaedic Surgery

## 2017-11-16 DIAGNOSIS — M25552 Pain in left hip: Secondary | ICD-10-CM

## 2017-11-16 DIAGNOSIS — M1612 Unilateral primary osteoarthritis, left hip: Secondary | ICD-10-CM

## 2017-11-16 MED ORDER — METHYLPREDNISOLONE 4 MG PO TABS: ORAL_TABLET | ORAL | 0 refills | Status: DC

## 2017-11-16 MED FILL — METHYLPREDNISOLONE 4 MG TAB: 4 | 6 days supply | Qty: 21 | Fill #0

## 2017-11-16 NOTE — Progress Notes (Signed)
Office Visit Note   Patient: Theresa Nolan           Date of Birth: Mar 06, 1955           MRN: [id number] Visit Date: 11/16/2017              Requested by: No referring provider defined for this encounter. PCP: Patient, No Pcp Per   Assessment & Plan: Visit Diagnoses:  1. Pain in left hip   2. Unilateral primary osteoarthritis, left hip     Plan: She does have severe arthritis in her left hip and this may have been masked somewhat by her back pain and sciatica.  I do feel is appropriate to put her on a 6-day steroid taper.  We may even recommend an intra-articular left hip steroid injection but I would like to keep her out of work the next 2 weeks and see her back in close follow-up to see how she is done with the medications.  I told her to contact her pain specialist and see if they would mind me providing Norco for just 5 days to see if this would help.  She will let us know and they can correspond with [place] to see if it is okay 5 to prescribe this medication for 5 days without violating her pain contract.  We will reevaluate her in 2 weeks.  All question concerns were answered and addressed.  Follow-Up Instructions: Return in about 2 weeks (around 11/30/2017).   Orders:  Orders Placed This Encounter  Procedures  . XR HIP UNILAT W OR W/O PELVIS 1V LEFT   Meds ordered this encounter  Medications  . methylPREDNISolone (MEDROL) 4 MG tablet    Sig: Medrol dose pack. Take as instructed    Dispense:  21 tablet    Refill:  0      Procedures: No procedures performed   Clinical Data: No additional findings.   Subjective: Chief Complaint  Patient presents with  . Lower Back - Pain  The patient comes in today for evaluation and treatment of acute on chronic low back pain but also pain is radiating to her left hip and groin area.  She is had problems mobilizing and reports a "catch" in her left hip and groin area that is been worsening recently.  A few months ago she denies any  pain in her hip.  She is an active individual who is morbidly obese with a BMI of 39.1.  She is not a diabetic.  She does take tramadol and Flexeril chronically and has a pain contract through Dr. Laurann Montana.  She is seeing Dr. Rolena Infante in the past for her lumbar spine.   HPI  Review of Systems She currently denies any chest pain, shortness of breath, headache, nausea, vomiting, fever, chills.  Objective: Vital Signs: There were no vitals taken for this visit.  Physical Exam She is alert and oriented x3 in no acute distress Ortho Exam On examination of her left side and hip she has severe pain with internal extra rotation of the left hip with no pain on the right side.  She does have a positive straight leg raise though bilaterally as well.  Her pain seems to be in the left hip in the low back area.  Distally her motor and sensory exam seems to be normal. Specialty Comments:  No specialty comments available.  Imaging: Xr Hip Unilat W Or W/o Pelvis 1v Left  Result Date: 11/16/2017 An AP pelvis and lateral left  hip shows significant arthritis of left hip.  There is severe joint space narrowing of the superior lateral aspect with almost bone-on-bone wear.  There is particular osteophytes and sclerotic changes.    PMFS History: Patient Active Problem List   Diagnosis Date Noted  . Cholecystitis 05/23/2014  . Acute cholecystitis   . HYPERLIPIDEMIA-MIXED 07/17/2009  . HYPERTENSION, BENIGN 07/17/2009  . SYNCOPE AND COLLAPSE 07/17/2009  . EDEMA 07/17/2009   Past Medical History:  Diagnosis Date  . Chronic back pain   . Degenerative joint disease   . Hypercholesteremia   . Hypertension   . Lacunar infarction ([place])   . TSH elevation     History reviewed. No pertinent family history.  Past Surgical History:  Procedure Laterality Date  . ABDOMINAL HYSTERECTOMY     ovaries remain   . CHOLECYSTECTOMY N/A 05/24/2014   Procedure: LAPAROSCOPIC CHOLECYSTECTOMY WITH INTRAOPERATIVE  CHOLANGIOGRAM;  Surgeon: Coralie Keens, MD;  Location: [facility];  Service: General;  Laterality: N/A;   Social History   Occupational History  . Not on file  Tobacco Use  . Smoking status: Never Smoker  . Smokeless tobacco: Never Used  Substance and Sexual Activity  . Alcohol use: Yes    Alcohol/week: 1.0 standard drinks    Types: 1 Glasses of wine per week    Comment: occasionally   . Drug use: No  . Sexual activity: Not on file

## 2017-11-19 MED FILL — SIMVASTATIN 40 MG TABLET: 40 | 90 days supply | Qty: 90 | Fill #1

## 2017-11-19 MED FILL — CARVEDILOL 12.5 MG TABLET: 12.5 | 90 days supply | Qty: 180 | Fill #1

## 2017-11-24 MED FILL — traMADol HCL 50 MG TABS: 50 | 30 days supply | Qty: 90 | Fill #0

## 2017-11-26 ENCOUNTER — Telehealth (INDEPENDENT_AMBULATORY_CARE_PROVIDER_SITE_OTHER): Payer: Self-pay | Admitting: Orthopaedic Surgery

## 2017-11-26 NOTE — Telephone Encounter (Signed)
Received VM from patient stating she had [employer] for STD claim. I called her back and lmvm advised that for [place] to be able to release records for when they request them, we will have to have release on file. And that she should go ahead and sign release for [employer] so we would be authorized to release to them when they request.

## 2017-11-30 ENCOUNTER — Ambulatory Visit (INDEPENDENT_AMBULATORY_CARE_PROVIDER_SITE_OTHER): Payer: 59 | Admitting: Orthopaedic Surgery

## 2017-11-30 ENCOUNTER — Encounter (INDEPENDENT_AMBULATORY_CARE_PROVIDER_SITE_OTHER): Payer: Self-pay

## 2017-11-30 ENCOUNTER — Encounter (INDEPENDENT_AMBULATORY_CARE_PROVIDER_SITE_OTHER): Payer: Self-pay | Admitting: Orthopaedic Surgery

## 2017-11-30 DIAGNOSIS — M25552 Pain in left hip: Secondary | ICD-10-CM | POA: Diagnosis not present

## 2017-11-30 DIAGNOSIS — M1612 Unilateral primary osteoarthritis, left hip: Secondary | ICD-10-CM

## 2017-11-30 NOTE — Progress Notes (Signed)
The patient is well-known to me.  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  I seen her recently with debilitating arthritis involving her left hip as well as low back pain.  Most of her pain seems to be associated with her hip.  Hurts in the groin.  Hurts daily.  At this point it is detrimentally affected his activity living, her quality of life and her mobility.  She is not a diabetic.  She has a BMI of 39.  We tried a steroid taper and anti-inflammatories and this did take the edge off a little bit but her pain is still quite significant and severe.  On exam right hip exam is normal left hip has severe pain on attempts of internal or external rotation.  We went over x-rays again to assure the extent of end-stage arthritis of her left hip.  She has complete loss of superior lateral joint space with para-articular osteophytes and sclerotic changes as well.  When I had her lay in the supine position I feel that its not difficult to get to her soft tissue planes to perform anterior hip surgery.  I gave her handout on anterior hip surgery and had a long and thorough discussion about the surgery.  Talked about her intraoperative and postoperative course.  We had a long and thorough discussion about the risk and benefits of the surgery.  Given the fact this is been going on for over a year and now she is failed all conservative treatment measurements and given the fact that she does have severe arthritis in her left hip we are recommending a proposed surgery and she agrees with this as well.  All questions concerns were answered and addressed.  We will work on getting this scheduled in the near future.

## 2017-12-13 MED FILL — CYCLOBENZAPRINE 10 MG TAB: 10 | 30 days supply | Qty: 30 | Fill #0

## 2017-12-13 MED FILL — HYDROCHLOROTHIAZIDE 25 MG T: 25 | 90 days supply | Qty: 90 | Fill #1

## 2017-12-13 MED FILL — LOSARTAN POTASSIUM 100 MG T: 100 | 30 days supply | Qty: 30 | Fill #1

## 2017-12-13 NOTE — Pre-Procedure Instructions (Addendum)
TEGAN BRITAIN  12/13/2017      [place], [place] - [address] [facility]. [address] [place] [address] Phone: [phone] Fax: [phone]    Your procedure is scheduled on 12/28/2017.  Report to [facility] Admitting at [place].M.  Call this number if you have problems the morning of surgery:  [phone]   Remember:  Do not eat or drink after midnight.     Take these medicines the morning of surgery with A SIP OF WATER: Carvedilol (Coreg) Levothyroxine (Synthroid) Omeprazole (Prilosec) Tramadol (Ultram) - if needed  7 days prior to surgery STOP taking any Tumeric, Aspirin (unless otherwise instructed by your surgeon), Aleve, Naproxen, Ibuprofen, Motrin, Advil, Goody's, BC's, all herbal medications, fish oil, and all vitamins     Do not wear jewelry, make-up or nail polish.  Do not wear lotions, powders, or perfumes, or deodorant.  Do not shave 48 hours prior to surgery.    Do not bring valuables to the hospital.  [facility] is not responsible for any belongings or valuables.  Contacts, eyeglasses, hearing aids, dentures or bridgework may not be worn into surgery.  Leave your suitcase in the car.  After surgery it may be brought to your room.  For patients admitted to the hospital, discharge time will be determined by your treatment team.  Patients discharged the day of surgery will not be allowed to drive home.   Name and phone number of your driver:    Special instructions:   [place]- Preparing For Surgery  Before surgery, you can play an important role. Because skin is not sterile, your skin needs to be as free of germs as possible. You can reduce the number of germs on your skin by washing with CHG (chlorahexidine gluconate) Soap before surgery.  CHG is an antiseptic cleaner which kills germs and bonds with the skin to continue killing germs even after washing.    Oral Hygiene is also important  to reduce your risk of infection.  Remember - BRUSH YOUR TEETH THE MORNING OF SURGERY WITH YOUR REGULAR TOOTHPASTE  Please do not use if you have an allergy to CHG or antibacterial soaps. If your skin becomes reddened/irritated stop using the CHG.  Do not shave (including legs and underarms) for at least 48 hours prior to first CHG shower. It is OK to shave your face.  Please follow these instructions carefully.   1. Shower the NIGHT BEFORE SURGERY and the MORNING OF SURGERY with CHG.   2. If you chose to wash your hair, wash your hair first as usual with your normal shampoo.  3. After you shampoo, rinse your hair and body thoroughly to remove the shampoo.  4. Use CHG as you would any other liquid soap. You can apply CHG directly to the skin and wash gently with a scrungie or a clean washcloth.   5. Apply the CHG Soap to your body ONLY FROM THE NECK DOWN.  Do not use on open wounds or open sores. Avoid contact with your eyes, ears, mouth and genitals (private parts). Wash Face and genitals (private parts)  with your normal soap.  6. Wash thoroughly, paying special attention to the area where your surgery will be performed.  7. Thoroughly rinse your body with warm water from the neck down.  8. DO NOT shower/wash with your normal soap after using and rinsing off the CHG Soap.  9. Pat yourself dry with  a CLEAN TOWEL.  10. Wear CLEAN PAJAMAS to bed the night before surgery, wear comfortable clothes the morning of surgery  11. Place CLEAN SHEETS on your bed the night of your first shower and DO NOT SLEEP WITH PETS.    Day of Surgery: Shower as stated above Do not apply any deodorants/lotions.  Please wear clean clothes to the hospital/surgery center.   Remember to brush your teeth WITH YOUR REGULAR TOOTHPASTE.    Please read over the following fact sheets that you were given.

## 2017-12-14 ENCOUNTER — Encounter (HOSPITAL_COMMUNITY): Payer: Self-pay

## 2017-12-14 ENCOUNTER — Encounter (HOSPITAL_COMMUNITY)
Admission: RE | Admit: 2017-12-14 | Discharge: 2017-12-14 | Disposition: A | Discharge status: Home / Self Care | Payer: 59 | Source: Ambulatory Visit | Attending: Orthopaedic Surgery | Admitting: Orthopaedic Surgery

## 2017-12-14 ENCOUNTER — Other Ambulatory Visit: Payer: Self-pay

## 2017-12-14 DIAGNOSIS — Z79890 Hormone replacement therapy (postmenopausal): Secondary | ICD-10-CM | POA: Diagnosis not present

## 2017-12-14 DIAGNOSIS — Z7982 Long term (current) use of aspirin: Secondary | ICD-10-CM | POA: Insufficient documentation

## 2017-12-14 DIAGNOSIS — Z87891 Personal history of nicotine dependence: Secondary | ICD-10-CM | POA: Insufficient documentation

## 2017-12-14 DIAGNOSIS — Z79899 Other long term (current) drug therapy: Secondary | ICD-10-CM | POA: Diagnosis not present

## 2017-12-14 DIAGNOSIS — I10 Essential (primary) hypertension: Secondary | ICD-10-CM | POA: Insufficient documentation

## 2017-12-14 DIAGNOSIS — K219 Gastro-esophageal reflux disease without esophagitis: Secondary | ICD-10-CM | POA: Diagnosis not present

## 2017-12-14 DIAGNOSIS — E7800 Pure hypercholesterolemia, unspecified: Secondary | ICD-10-CM | POA: Insufficient documentation

## 2017-12-14 DIAGNOSIS — Z8673 Personal history of transient ischemic attack (TIA), and cerebral infarction without residual deficits: Secondary | ICD-10-CM | POA: Insufficient documentation

## 2017-12-14 DIAGNOSIS — Z9049 Acquired absence of other specified parts of digestive tract: Secondary | ICD-10-CM | POA: Diagnosis not present

## 2017-12-14 DIAGNOSIS — M1612 Unilateral primary osteoarthritis, left hip: Secondary | ICD-10-CM | POA: Diagnosis not present

## 2017-12-14 DIAGNOSIS — Z90710 Acquired absence of both cervix and uterus: Secondary | ICD-10-CM | POA: Diagnosis not present

## 2017-12-14 DIAGNOSIS — Z01818 Encounter for other preprocedural examination: Secondary | ICD-10-CM | POA: Diagnosis not present

## 2017-12-14 DIAGNOSIS — E039 Hypothyroidism, unspecified: Secondary | ICD-10-CM | POA: Insufficient documentation

## 2017-12-14 HISTORY — DX: Hypothyroidism, unspecified: E03.9

## 2017-12-14 HISTORY — DX: Cerebral infarction, unspecified: I63.9

## 2017-12-14 HISTORY — DX: Gastro-esophageal reflux disease without esophagitis: K21.9

## 2017-12-14 LAB — CBC
HEMATOCRIT: 41.5 % (ref 36.0–46.0)
HEMOGLOBIN: 12.7 g/dL (ref 12.0–15.0)
MCH: 29.1 pg (ref 26.0–34.0)
MCHC: 30.6 g/dL (ref 30.0–36.0)
MCV: 95.2 fL (ref 80.0–100.0)
Platelets: 355 10*3/uL (ref 150–400)
RBC: 4.36 MIL/uL (ref 3.87–5.11)
RDW: 13.1 % (ref 11.5–15.5)
WBC: 8.5 10*3/uL (ref 4.0–10.5)
nRBC: 0 % (ref 0.0–0.2)

## 2017-12-14 LAB — SURGICAL PCR SCREEN
MRSA, PCR: NEGATIVE
Staphylococcus aureus: NEGATIVE

## 2017-12-14 LAB — BASIC METABOLIC PANEL
Anion gap: 9 (ref 5–15)
BUN: 10 mg/dL (ref 8–23)
CHLORIDE: 105 mmol/L (ref 98–111)
CO2: 25 mmol/L (ref 22–32)
CREATININE: 0.77 mg/dL (ref 0.44–1.00)
Calcium: 9.4 mg/dL (ref 8.9–10.3)
GFR calc Af Amer: >60 mL/min (ref >60)
GFR calc non Af Amer: >60 mL/min (ref >60)
GLUCOSE: 96 mg/dL (ref 70–99)
POTASSIUM: 3.7 mmol/L (ref 3.5–5.1)
SODIUM: 139 mmol/L (ref 135–145)

## 2017-12-14 LAB — NO BLOOD PRODUCTS

## 2017-12-14 MED FILL — EZETIMIBE 10 MG TABLET: 10 | 90 days supply | Qty: 90 | Fill #0

## 2017-12-14 NOTE — Progress Notes (Signed)
PCP - Dr. Kelton Pillar Cardiologist - Dr. Einar Gip  Chest x-ray - n/a EKG - 12/14/2017 Stress Test - 2004/2005; requested from Dr. Irven Shelling office ECHO - 2003 Cardiac Cath - 2004/2005; requested from Dr. Irven Shelling office  Sleep Study - patient denies  Aspirin Instructions: per patient, last dose 12/11/2017  Anesthesia review: yes, history of cardiac testing, records pending  Patient denies shortness of breath, fever, cough and chest pain at PAT appointment   Patient verbalized understanding of instructions that were given to them at the PAT appointment. Patient was also instructed that they will need to review over the PAT instructions again at home before surgery.

## 2017-12-15 ENCOUNTER — Encounter (HOSPITAL_COMMUNITY): Payer: Self-pay

## 2017-12-15 NOTE — Anesthesia Preprocedure Evaluation (Addendum)
Anesthesia Evaluation  Patient identified by MRN, date of birth, ID band Patient awake    Reviewed: Allergy & Precautions, NPO status , Patient's Chart, lab work & pertinent test results, reviewed documented beta blocker date and time   History of Anesthesia Complications Negative for: history of anesthetic complications  Airway Mallampati: II  TM Distance: >3 FB Neck ROM: Full    Dental no notable dental hx.    Pulmonary neg pulmonary ROS, former smoker,    Pulmonary exam normal        Cardiovascular hypertension, Pt. on medications and Pt. on home beta blockers Normal cardiovascular exam     Neuro/Psych CVA negative psych ROS   GI/Hepatic Neg liver ROS, GERD  ,  Endo/Other  Hypothyroidism Morbid obesity  Renal/GU negative Renal ROS  negative genitourinary   Musculoskeletal  (+) Arthritis , Osteoarthritis,    Abdominal (+) + obese,   Peds  Hematology  (+) REFUSES BLOOD PRODUCTS, JEHOVAH'S WITNESS  Anesthesia Other Findings 62 yo F for L THA - CVA, HTN, hypothyroid, GERD, BMI 40 - Refuses blood products (albumin OK)  Reproductive/Obstetrics                           Anesthesia Physical Anesthesia Plan  ASA: III  Anesthesia Plan: Spinal   Post-op Pain Management:    Induction:   PONV Risk Score and Plan: 2 and Propofol infusion, TIVA, Midazolam and Treatment may vary due to age or medical condition  Airway Management Planned: Nasal Cannula and Simple Face Mask  Additional Equipment: None  Intra-op Plan:   Post-operative Plan:   Informed Consent: I have reviewed the patients History and Physical, chart, labs and discussed the procedure including the risks, benefits and alternatives for the proposed anesthesia with the patient or authorized representative who has indicated his/her understanding and acceptance.     Plan Discussed with:   Anesthesia Plan Comments: (PAT note  written 12/15/2017 by Myra Gianotti, PA-C. )      Anesthesia Quick Evaluation

## 2017-12-15 NOTE — Progress Notes (Signed)
Anesthesia Chart Review:  Case:  [id number] Date/Time:  12/28/17 1130   Procedure:  LEFT TOTAL HIP ARTHROPLASTY ANTERIOR APPROACH (Left )   Anesthesia type:  Choice   Pre-op diagnosis:  osteoarthritis left hip   Location:  [facility] OR ROOM 07 / [place] OR   Surgeon:  Mcarthur Rossetti, MD      DISCUSSION: Patient is a 62 year old female scheduled for the above procedure.  History includes former smoker, HTN, hypercholesterolemia, hypothyroidism, GERD, CVA (chronic lacunar infarct just inferior to the posterior limb of the right internal capsule 06/14/09 CT). Normal coronaries in 2006. BMI is consistent with morbid obesity.  TRANSFUSE NO BLOOD (signed form that "only albumin or albumin-containing products may be administered" if deemed necessary). CBC WNL.  Per patient, last ASA dose 12/11/17.   If no acute changes then I anticipate that she can proceed as planned.   VS: BP 129/66   Pulse 74   Temp 36.7 C   Resp 20   Ht 5\' 6"  (1.676 m)   Wt 114.2 kg   SpO2 99%   BMI 40.64 kg/m    PROVIDERS: Kelton Pillar, MD is documented as PCP per PAT RN.  - Patient does not routinely see a cardiologist, but saw Eustace Quail, MD in 2011 for near-syncope attributed to elevated BP, and saw Kela Millin, MD in 2006 for cath (normal coronaries) and last 01/21/12 for HTN. BP improved with medication changes and PRN cardiology follow-up recommended.    LABS: Labs reviewed: Acceptable for surgery. (all labs ordered are listed, but only abnormal results are displayed)  Labs Reviewed  SURGICAL PCR SCREEN  BASIC METABOLIC PANEL  CBC  NO BLOOD PRODUCTS    EKG: 12/14/17: NSR, septal infarct (age undetermined). QT 428 ms, QTc 477 ms, which is shorter when compared to 05/23/14 tracing.   CV: ETT 12/14/11 [facility] Cardiovascular): Conclusions:  Negative for ischemia, rare PVCs in recovery.   Carotid U/S 06/16/09: Summary: No significant extracranial carotid artery stenosis identified.  Patent  vertebral arteries with antegrade flow.  Echo 06/14/09: Study Conclusions: Left ventricle: The cavity size was normal.  Wall thickness was normal.  Systolic function was normal.  The estimated ejection fraction was in the range of 55 to 60%.  Wall motion was normal; there were no regional wall motion abnormalities.  Cardiac cath 10/08/04: IMPRESSION:  1.  Normal LV systolic function, ejection fraction 60%, no significant      mitral regurgitation.  2.  Normal coronary arteries, aberrant origin of circumflex, however, this      is normal.  3.  False positive Cardiolite stress test.   Past Medical History:  Diagnosis Date  . Chronic back pain   . Degenerative joint disease   . GERD (gastroesophageal reflux disease)   . Hypercholesteremia   . Hypertension   . Hypothyroidism   . Lacunar infarction ([place])   . Stroke ([place])   . TSH elevation     Past Surgical History:  Procedure Laterality Date  . ABDOMINAL HYSTERECTOMY     ovaries remain   . APPENDECTOMY  1989-1990  . CARDIAC CATHETERIZATION  2004/2005  . CHOLECYSTECTOMY N/A 05/24/2014   Procedure: LAPAROSCOPIC CHOLECYSTECTOMY WITH INTRAOPERATIVE CHOLANGIOGRAM;  Surgeon: Coralie Keens, MD;  Location: [place];  Service: General;  Laterality: N/A;  . HERNIA REPAIR     umbilical hernia repair as a child  . TONSILLECTOMY      MEDICATIONS: . amLODipine-valsartan (EXFORGE) 10-160 MG per tablet  . aspirin EC 81  MG tablet  . carvedilol (COREG) 12.5 MG tablet  . cholecalciferol (VITAMIN D) 1000 UNITS tablet  . co-enzyme Q-10 30 MG capsule  . Cyanocobalamin (B-12 SL)  . ezetimibe (ZETIA) 10 MG tablet  . hydrochlorothiazide (HYDRODIURIL) 25 MG tablet  . levothyroxine (SYNTHROID, LEVOTHROID) 25 MCG tablet  . Menthol-Methyl Salicylate (MUSCLE RUB) 10-15 % CREA  . naproxen sodium (ANAPROX) 220 MG tablet  . Omega-3 Fatty Acids (FISH OIL) 1000 MG CAPS  . omeprazole (PRILOSEC OTC) 20 MG tablet  . oxyCODONE-acetaminophen (PERCOCET/ROXICET)  5-325 MG per tablet  . predniSONE (DELTASONE) 20 MG tablet  . simvastatin (ZOCOR) 40 MG tablet  . traMADol (ULTRAM) 50 MG tablet  . TURMERIC PO   No current facility-administered medications for this encounter.   She is not currently on Percocet or prednisone.    [facility] Short Stay Center/Anesthesiology Phone [phone] 12/15/2017 1:52 PM

## 2017-12-21 ENCOUNTER — Other Ambulatory Visit (INDEPENDENT_AMBULATORY_CARE_PROVIDER_SITE_OTHER): Payer: Self-pay

## 2017-12-23 ENCOUNTER — Other Ambulatory Visit (INDEPENDENT_AMBULATORY_CARE_PROVIDER_SITE_OTHER): Payer: Self-pay | Admitting: Orthopaedic Surgery

## 2017-12-23 DIAGNOSIS — M1612 Unilateral primary osteoarthritis, left hip: Secondary | ICD-10-CM

## 2017-12-27 MED ORDER — DEXTROSE 5 % IV SOLN
3.0000 g | INTRAVENOUS | Status: AC
  Administered 2017-12-28: 2 g via INTRAVENOUS
  Filled 2017-12-27: qty 3

## 2017-12-27 MED ORDER — TRANEXAMIC ACID-NACL 1000-0.7 MG/100ML-% IV SOLN
1000.0000 mg | INTRAVENOUS | Status: AC
  Administered 2017-12-28: 1000 mg via INTRAVENOUS
  Filled 2017-12-27: qty 100

## 2017-12-28 ENCOUNTER — Inpatient Hospital Stay (HOSPITAL_COMMUNITY): Payer: 59 | Admitting: Certified Registered Nurse Anesthetist

## 2017-12-28 ENCOUNTER — Encounter (HOSPITAL_COMMUNITY)
Admission: RE | Disposition: A | Discharge status: Home Health | Payer: Self-pay | Source: Home / Self Care | Attending: Orthopaedic Surgery

## 2017-12-28 ENCOUNTER — Other Ambulatory Visit: Payer: Self-pay

## 2017-12-28 ENCOUNTER — Encounter (HOSPITAL_COMMUNITY): Payer: Self-pay | Admitting: Surgery

## 2017-12-28 ENCOUNTER — Inpatient Hospital Stay (HOSPITAL_COMMUNITY): Payer: 59

## 2017-12-28 ENCOUNTER — Inpatient Hospital Stay (HOSPITAL_COMMUNITY): Payer: 59 | Admitting: Vascular Surgery

## 2017-12-28 ENCOUNTER — Inpatient Hospital Stay (HOSPITAL_COMMUNITY)
Admission: RE | Admit: 2017-12-28 | Discharge: 2017-12-31 | DRG: 470 | Disposition: A | Discharge status: Home Health | Payer: 59 | Attending: Orthopaedic Surgery | Admitting: Orthopaedic Surgery

## 2017-12-28 DIAGNOSIS — E785 Hyperlipidemia, unspecified: Secondary | ICD-10-CM | POA: Diagnosis present

## 2017-12-28 DIAGNOSIS — Z87891 Personal history of nicotine dependence: Secondary | ICD-10-CM | POA: Diagnosis not present

## 2017-12-28 DIAGNOSIS — I10 Essential (primary) hypertension: Secondary | ICD-10-CM | POA: Diagnosis not present

## 2017-12-28 DIAGNOSIS — Z96642 Presence of left artificial hip joint: Secondary | ICD-10-CM

## 2017-12-28 DIAGNOSIS — K219 Gastro-esophageal reflux disease without esophagitis: Secondary | ICD-10-CM | POA: Diagnosis present

## 2017-12-28 DIAGNOSIS — M1612 Unilateral primary osteoarthritis, left hip: Principal | ICD-10-CM

## 2017-12-28 DIAGNOSIS — E039 Hypothyroidism, unspecified: Secondary | ICD-10-CM | POA: Diagnosis present

## 2017-12-28 DIAGNOSIS — Z471 Aftercare following joint replacement surgery: Secondary | ICD-10-CM | POA: Diagnosis not present

## 2017-12-28 DIAGNOSIS — Z6841 Body Mass Index (BMI) 40.0 and over, adult: Secondary | ICD-10-CM

## 2017-12-28 DIAGNOSIS — Z419 Encounter for procedure for purposes other than remedying health state, unspecified: Secondary | ICD-10-CM

## 2017-12-28 DIAGNOSIS — E7800 Pure hypercholesterolemia, unspecified: Secondary | ICD-10-CM | POA: Diagnosis present

## 2017-12-28 HISTORY — PX: TOTAL HIP ARTHROPLASTY: SHX124

## 2017-12-28 SURGERY — ARTHROPLASTY, HIP, TOTAL, ANTERIOR APPROACH
Anesthesia: Spinal | Laterality: Left

## 2017-12-28 MED ORDER — ONDANSETRON HCL 4 MG/2ML IJ SOLN: 4.0000 mg | Freq: Once | INTRAMUSCULAR | Status: DC | PRN

## 2017-12-28 MED ORDER — BUPIVACAINE IN DEXTROSE 0.75-8.25 % IT SOLN
INTRATHECAL | Status: DC | PRN
  Administered 2017-12-28: 2 mL via INTRATHECAL

## 2017-12-28 MED ORDER — HYDROMORPHONE HCL 1 MG/ML IJ SOLN
INTRAMUSCULAR | Status: AC
  Administered 2017-12-28: 0.5 mg via INTRAVENOUS
  Filled 2017-12-28: qty 1

## 2017-12-28 MED ORDER — DOCUSATE SODIUM 100 MG PO CAPS
100.0000 mg | ORAL_CAPSULE | Freq: Two times a day (BID) | ORAL | Status: DC
  Administered 2017-12-28 – 2017-12-31 (×6): 100 mg via ORAL
  Filled 2017-12-28 (×6): qty 1

## 2017-12-28 MED ORDER — ACETAMINOPHEN 325 MG PO TABS
325.0000 mg | ORAL_TABLET | Freq: Four times a day (QID) | ORAL | Status: DC | PRN
  Administered 2017-12-29 – 2017-12-31 (×7): 650 mg via ORAL
  Filled 2017-12-28 (×7): qty 2

## 2017-12-28 MED ORDER — CEFAZOLIN SODIUM-DEXTROSE 2-4 GM/100ML-% IV SOLN
INTRAVENOUS | Status: AC
  Filled 2017-12-28: qty 100

## 2017-12-28 MED ORDER — MENTHOL 3 MG MT LOZG: 1.0000 | LOZENGE | OROMUCOSAL | Status: DC | PRN

## 2017-12-28 MED ORDER — DIPHENHYDRAMINE HCL 12.5 MG/5ML PO ELIX: 12.5000 mg | ORAL_SOLUTION | ORAL | Status: DC | PRN

## 2017-12-28 MED ORDER — HYDROMORPHONE HCL 1 MG/ML IJ SOLN
0.5000 mg | INTRAMUSCULAR | Status: DC | PRN
  Administered 2017-12-29 (×3): 1 mg via INTRAVENOUS
  Filled 2017-12-28 (×5): qty 1

## 2017-12-28 MED ORDER — HYDROMORPHONE HCL 1 MG/ML IJ SOLN
0.2500 mg | INTRAMUSCULAR | Status: DC | PRN
  Administered 2017-12-28 (×4): 0.5 mg via INTRAVENOUS

## 2017-12-28 MED ORDER — COENZYME Q10 30 MG PO CAPS: 30.0000 mg | ORAL_CAPSULE | Freq: Every evening | ORAL | Status: DC

## 2017-12-28 MED ORDER — METHOCARBAMOL 1000 MG/10ML IJ SOLN
500.0000 mg | Freq: Four times a day (QID) | INTRAVENOUS | Status: DC | PRN
  Filled 2017-12-28: qty 5

## 2017-12-28 MED ORDER — DEXAMETHASONE SODIUM PHOSPHATE 10 MG/ML IJ SOLN
INTRAMUSCULAR | Status: DC | PRN
  Administered 2017-12-28: 10 mg via INTRAVENOUS

## 2017-12-28 MED ORDER — POLYETHYLENE GLYCOL 3350 17 G PO PACK: 17.0000 g | PACK | Freq: Every day | ORAL | Status: DC | PRN

## 2017-12-28 MED ORDER — VITAMIN D 25 MCG (1000 UNIT) PO TABS
2000.0000 [IU] | ORAL_TABLET | Freq: Every day | ORAL | Status: DC
  Administered 2017-12-29 – 2017-12-31 (×3): 2000 [IU] via ORAL
  Filled 2017-12-28 (×5): qty 2

## 2017-12-28 MED ORDER — PROPOFOL 1000 MG/100ML IV EMUL
INTRAVENOUS | Status: AC
  Filled 2017-12-28: qty 300

## 2017-12-28 MED ORDER — FENTANYL CITRATE (PF) 100 MCG/2ML IJ SOLN
INTRAMUSCULAR | Status: AC
  Filled 2017-12-28: qty 2

## 2017-12-28 MED ORDER — LACTATED RINGERS IV SOLN
INTRAVENOUS | Status: DC | PRN
  Administered 2017-12-28 (×2): via INTRAVENOUS

## 2017-12-28 MED ORDER — FENTANYL CITRATE (PF) 250 MCG/5ML IJ SOLN
INTRAMUSCULAR | Status: AC
  Filled 2017-12-28: qty 5

## 2017-12-28 MED ORDER — PHENYLEPHRINE 40 MCG/ML (10ML) SYRINGE FOR IV PUSH (FOR BLOOD PRESSURE SUPPORT)
PREFILLED_SYRINGE | INTRAVENOUS | Status: AC
  Filled 2017-12-28: qty 10

## 2017-12-28 MED ORDER — CEFAZOLIN SODIUM-DEXTROSE 1-4 GM/50ML-% IV SOLN
1.0000 g | Freq: Four times a day (QID) | INTRAVENOUS | Status: AC
  Administered 2017-12-28 – 2017-12-29 (×2): 1 g via INTRAVENOUS
  Filled 2017-12-28 (×3): qty 50

## 2017-12-28 MED ORDER — METHOCARBAMOL 500 MG PO TABS
500.0000 mg | ORAL_TABLET | Freq: Four times a day (QID) | ORAL | Status: DC | PRN
  Administered 2017-12-28 – 2017-12-31 (×9): 500 mg via ORAL
  Filled 2017-12-28 (×9): qty 1

## 2017-12-28 MED ORDER — EPHEDRINE 5 MG/ML INJ
INTRAVENOUS | Status: AC
  Filled 2017-12-28: qty 30

## 2017-12-28 MED ORDER — METOCLOPRAMIDE HCL 5 MG PO TABS: 5.0000 mg | ORAL_TABLET | Freq: Three times a day (TID) | ORAL | Status: DC | PRN

## 2017-12-28 MED ORDER — ALUM & MAG HYDROXIDE-SIMETH 200-200-20 MG/5ML PO SUSP: 30.0000 mL | ORAL | Status: DC | PRN

## 2017-12-28 MED ORDER — IRBESARTAN 150 MG PO TABS
150.0000 mg | ORAL_TABLET | Freq: Every evening | ORAL | Status: DC
  Administered 2017-12-28 – 2017-12-29 (×2): 150 mg via ORAL
  Filled 2017-12-28 (×2): qty 1

## 2017-12-28 MED ORDER — SODIUM CHLORIDE 0.9 % IR SOLN
Status: DC | PRN
  Administered 2017-12-28: 1000 mL

## 2017-12-28 MED ORDER — LEVOTHYROXINE SODIUM 25 MCG PO TABS
25.0000 ug | ORAL_TABLET | Freq: Every day | ORAL | Status: DC
  Administered 2017-12-29 – 2017-12-31 (×3): 25 ug via ORAL
  Filled 2017-12-28 (×3): qty 1

## 2017-12-28 MED ORDER — 0.9 % SODIUM CHLORIDE (POUR BTL) OPTIME
TOPICAL | Status: DC | PRN
  Administered 2017-12-28: 1000 mL

## 2017-12-28 MED ORDER — ONDANSETRON HCL 4 MG/2ML IJ SOLN: 4.0000 mg | Freq: Four times a day (QID) | INTRAMUSCULAR | Status: DC | PRN

## 2017-12-28 MED ORDER — PROPOFOL 10 MG/ML IV BOLUS
INTRAVENOUS | Status: DC | PRN
  Administered 2017-12-28: 20 mg via INTRAVENOUS
  Administered 2017-12-28: 10 mg via INTRAVENOUS

## 2017-12-28 MED ORDER — EPHEDRINE SULFATE-NACL 50-0.9 MG/10ML-% IV SOSY
PREFILLED_SYRINGE | INTRAVENOUS | Status: DC | PRN
  Administered 2017-12-28: 10 mg via INTRAVENOUS
  Administered 2017-12-28: 5 mg via INTRAVENOUS

## 2017-12-28 MED ORDER — AMLODIPINE BESYLATE-VALSARTAN 10-160 MG PO TABS: 1.0000 | ORAL_TABLET | Freq: Every evening | ORAL | Status: DC

## 2017-12-28 MED ORDER — PHENYLEPHRINE HCL 10 MG/ML IJ SOLN
INTRAMUSCULAR | Status: DC | PRN
  Administered 2017-12-28: 80 ug via INTRAVENOUS

## 2017-12-28 MED ORDER — SIMVASTATIN 20 MG PO TABS
40.0000 mg | ORAL_TABLET | Freq: Every day | ORAL | Status: DC
  Administered 2017-12-28: 40 mg via ORAL
  Filled 2017-12-28: qty 2

## 2017-12-28 MED ORDER — EZETIMIBE 10 MG PO TABS
10.0000 mg | ORAL_TABLET | Freq: Every day | ORAL | Status: DC
  Administered 2017-12-28 – 2017-12-31 (×4): 10 mg via ORAL
  Filled 2017-12-28 (×4): qty 1

## 2017-12-28 MED ORDER — OXYCODONE HCL 5 MG PO TABS
ORAL_TABLET | ORAL | Status: AC
  Administered 2017-12-28: 5 mg via ORAL
  Filled 2017-12-28: qty 1

## 2017-12-28 MED ORDER — OXYCODONE HCL 5 MG PO TABS
5.0000 mg | ORAL_TABLET | ORAL | Status: DC | PRN
  Administered 2017-12-29 – 2017-12-31 (×2): 10 mg via ORAL
  Filled 2017-12-28 (×3): qty 2

## 2017-12-28 MED ORDER — OXYCODONE HCL 5 MG PO TABS
10.0000 mg | ORAL_TABLET | ORAL | Status: DC | PRN
  Administered 2017-12-28 (×2): 10 mg via ORAL
  Administered 2017-12-29 – 2017-12-31 (×10): 15 mg via ORAL
  Filled 2017-12-28 (×2): qty 3
  Filled 2017-12-28: qty 2
  Filled 2017-12-28 (×9): qty 3

## 2017-12-28 MED ORDER — PANTOPRAZOLE SODIUM 40 MG PO TBEC
40.0000 mg | DELAYED_RELEASE_TABLET | Freq: Every day | ORAL | Status: DC
  Administered 2017-12-28 – 2017-12-31 (×4): 40 mg via ORAL
  Filled 2017-12-28 (×4): qty 1

## 2017-12-28 MED ORDER — PHENOL 1.4 % MT LIQD: 1.0000 | OROMUCOSAL | Status: DC | PRN

## 2017-12-28 MED ORDER — ONDANSETRON HCL 4 MG/2ML IJ SOLN
INTRAMUSCULAR | Status: AC
  Filled 2017-12-28: qty 2

## 2017-12-28 MED ORDER — ASPIRIN 81 MG PO CHEW
81.0000 mg | CHEWABLE_TABLET | Freq: Two times a day (BID) | ORAL | Status: DC
  Administered 2017-12-28 – 2017-12-31 (×6): 81 mg via ORAL
  Filled 2017-12-28 (×6): qty 1

## 2017-12-28 MED ORDER — METOCLOPRAMIDE HCL 5 MG/ML IJ SOLN: 5.0000 mg | Freq: Three times a day (TID) | INTRAMUSCULAR | Status: DC | PRN

## 2017-12-28 MED ORDER — PROPOFOL 500 MG/50ML IV EMUL
INTRAVENOUS | Status: DC | PRN
  Administered 2017-12-28: 80 ug/kg/min via INTRAVENOUS

## 2017-12-28 MED ORDER — DEXAMETHASONE SODIUM PHOSPHATE 10 MG/ML IJ SOLN
INTRAMUSCULAR | Status: AC
  Filled 2017-12-28: qty 1

## 2017-12-28 MED ORDER — OXYCODONE HCL 5 MG/5ML PO SOLN: 5.0000 mg | Freq: Once | ORAL | Status: AC | PRN

## 2017-12-28 MED ORDER — AMLODIPINE BESYLATE 10 MG PO TABS
10.0000 mg | ORAL_TABLET | Freq: Every evening | ORAL | Status: DC
  Administered 2017-12-28 – 2017-12-29 (×2): 10 mg via ORAL
  Filled 2017-12-28 (×2): qty 1

## 2017-12-28 MED ORDER — SODIUM CHLORIDE 0.9 % IV SOLN
INTRAVENOUS | Status: DC
  Administered 2017-12-28: 17:00:00 via INTRAVENOUS

## 2017-12-28 MED ORDER — ONDANSETRON HCL 4 MG/2ML IJ SOLN
INTRAMUSCULAR | Status: DC | PRN
  Administered 2017-12-28: 4 mg via INTRAVENOUS

## 2017-12-28 MED ORDER — CARVEDILOL 12.5 MG PO TABS
12.5000 mg | ORAL_TABLET | Freq: Two times a day (BID) | ORAL | Status: DC
  Administered 2017-12-28 – 2017-12-31 (×5): 12.5 mg via ORAL
  Filled 2017-12-28 (×4): qty 1
  Filled 2017-12-28: qty 2
  Filled 2017-12-28: qty 1

## 2017-12-28 MED ORDER — FENTANYL CITRATE (PF) 100 MCG/2ML IJ SOLN
INTRAMUSCULAR | Status: AC
  Administered 2017-12-28: 50 ug via INTRAVENOUS
  Filled 2017-12-28: qty 2

## 2017-12-28 MED ORDER — FENTANYL CITRATE (PF) 100 MCG/2ML IJ SOLN
25.0000 ug | INTRAMUSCULAR | Status: DC | PRN
  Administered 2017-12-28 (×3): 50 ug via INTRAVENOUS

## 2017-12-28 MED ORDER — OXYCODONE HCL 5 MG PO TABS
5.0000 mg | ORAL_TABLET | Freq: Once | ORAL | Status: AC | PRN
  Administered 2017-12-28: 5 mg via ORAL

## 2017-12-28 MED ORDER — ATORVASTATIN CALCIUM 10 MG PO TABS
20.0000 mg | ORAL_TABLET | Freq: Every day | ORAL | Status: DC
  Administered 2017-12-29 – 2017-12-30 (×2): 20 mg via ORAL
  Filled 2017-12-28 (×2): qty 2

## 2017-12-28 MED ORDER — ONDANSETRON HCL 4 MG PO TABS: 4.0000 mg | ORAL_TABLET | Freq: Four times a day (QID) | ORAL | Status: DC | PRN

## 2017-12-28 MED ORDER — SODIUM CHLORIDE 0.9 % IV SOLN
INTRAVENOUS | Status: DC | PRN
  Administered 2017-12-28: 40 ug/min via INTRAVENOUS

## 2017-12-28 MED ORDER — ZOLPIDEM TARTRATE 5 MG PO TABS: 5.0000 mg | ORAL_TABLET | Freq: Every evening | ORAL | Status: DC | PRN

## 2017-12-28 MED ORDER — HYDROCHLOROTHIAZIDE 25 MG PO TABS
25.0000 mg | ORAL_TABLET | Freq: Every evening | ORAL | Status: DC
  Administered 2017-12-28 – 2017-12-29 (×2): 25 mg via ORAL
  Filled 2017-12-28 (×2): qty 1

## 2017-12-28 MED ORDER — ALBUMIN HUMAN 5 % IV SOLN
INTRAVENOUS | Status: DC | PRN
  Administered 2017-12-28: 13:00:00 via INTRAVENOUS

## 2017-12-28 SURGICAL SUPPLY — 47 items
BLADE CLIPPER SURG (BLADE) IMPLANT
BLADE SAW SGTL 18X1.27X75 (BLADE) ×2 IMPLANT
COVER SURGICAL LIGHT HANDLE (MISCELLANEOUS) ×2 IMPLANT
CUP SECTOR GRIPTON 50MM (Cup) ×2 IMPLANT
DRAPE C-ARM 42X72 X-RAY (DRAPES) ×2 IMPLANT
DRAPE STERI IOBAN 125X83 (DRAPES) ×2 IMPLANT
DRAPE U-SHAPE 47X51 STRL (DRAPES) ×6 IMPLANT
DRSG AQUACEL AG ADV 3.5X10 (GAUZE/BANDAGES/DRESSINGS) ×2 IMPLANT
DURAPREP 26ML APPLICATOR (WOUND CARE) ×2 IMPLANT
ELECT BLADE 4.0 EZ CLEAN MEGAD (MISCELLANEOUS) ×2
ELECT BLADE 6.5 EXT (BLADE) IMPLANT
ELECT REM PT RETURN 9FT ADLT (ELECTROSURGICAL) ×2
ELECTRODE BLDE 4.0 EZ CLN MEGD (MISCELLANEOUS) ×1 IMPLANT
ELECTRODE REM PT RTRN 9FT ADLT (ELECTROSURGICAL) ×1 IMPLANT
FACESHIELD WRAPAROUND (MASK) ×4 IMPLANT
GAUZE XEROFORM 1X8 LF (GAUZE/BANDAGES/DRESSINGS) ×2 IMPLANT
GLOVE BIOGEL PI IND STRL 8 (GLOVE) ×2 IMPLANT
GLOVE BIOGEL PI INDICATOR 8 (GLOVE) ×2
GLOVE ECLIPSE 8.0 STRL XLNG CF (GLOVE) ×2 IMPLANT
GLOVE ORTHO TXT STRL SZ7.5 (GLOVE) ×4 IMPLANT
GOWN STRL REUS W/ TWL LRG LVL3 (GOWN DISPOSABLE) ×2 IMPLANT
GOWN STRL REUS W/ TWL XL LVL3 (GOWN DISPOSABLE) ×2 IMPLANT
GOWN STRL REUS W/TWL LRG LVL3 (GOWN DISPOSABLE) ×2
GOWN STRL REUS W/TWL XL LVL3 (GOWN DISPOSABLE) ×2
HANDPIECE INTERPULSE COAX TIP (DISPOSABLE) ×1
HEAD FEM STD 32X+5 STRL (Hips) ×2 IMPLANT
KIT BASIN OR (CUSTOM PROCEDURE TRAY) ×2 IMPLANT
KIT TURNOVER KIT B (KITS) ×2 IMPLANT
LINER ACETABULAR 32X50 (Liner) ×2 IMPLANT
MANIFOLD NEPTUNE II (INSTRUMENTS) ×2 IMPLANT
NS IRRIG 1000ML POUR BTL (IV SOLUTION) ×2 IMPLANT
PACK TOTAL JOINT (CUSTOM PROCEDURE TRAY) ×2 IMPLANT
PAD ARMBOARD 7.5X6 YLW CONV (MISCELLANEOUS) ×2 IMPLANT
SCREW PINN CAN 6.5X20 (Screw) ×2 IMPLANT
SET HNDPC FAN SPRY TIP SCT (DISPOSABLE) ×1 IMPLANT
STEM CORAIL KLA10 (Stem) ×2 IMPLANT
SUT ETHIBOND NAB CT1 #1 30IN (SUTURE) ×2 IMPLANT
SUT ETHILON 2 0 FS 18 (SUTURE) ×4 IMPLANT
SUT MNCRL AB 4-0 PS2 18 (SUTURE) ×2 IMPLANT
SUT VIC AB 0 CT1 27 (SUTURE) ×2
SUT VIC AB 0 CT1 27XBRD ANBCTR (SUTURE) ×2 IMPLANT
SUT VIC AB 1 CT1 27 (SUTURE) ×2
SUT VIC AB 1 CT1 27XBRD ANBCTR (SUTURE) ×2 IMPLANT
SUT VIC AB 2-0 CT1 27 (SUTURE) ×2
SUT VIC AB 2-0 CT1 TAPERPNT 27 (SUTURE) ×2 IMPLANT
TOWEL OR 17X24 6PK STRL BLUE (TOWEL DISPOSABLE) ×2 IMPLANT
TOWEL OR 17X26 10 PK STRL BLUE (TOWEL DISPOSABLE) ×2 IMPLANT

## 2017-12-28 NOTE — Anesthesia Postprocedure Evaluation (Signed)
Anesthesia Post Note  Patient: Theresa Nolan  Procedure(s) Performed: LEFT TOTAL HIP ARTHROPLASTY ANTERIOR APPROACH (Left )     Patient location during evaluation: PACU Anesthesia Type: Spinal Level of consciousness: oriented and awake and alert Pain management: pain level controlled Vital Signs Assessment: post-procedure vital signs reviewed and stable Respiratory status: spontaneous breathing, respiratory function stable and nonlabored ventilation Cardiovascular status: blood pressure returned to baseline and stable Postop Assessment: no headache, no backache, no apparent nausea or vomiting and spinal receding Anesthetic complications: no    Last Vitals:  Vitals:   12/28/17 1445 12/28/17 1500  BP: 139/82 133/77  Pulse: 76 78  Resp: 12 12  Temp:    SpO2: 97% 97%    Last Pain:  Vitals:   12/28/17 1526  TempSrc:   PainSc: 6                  Lidia Collum

## 2017-12-28 NOTE — H&P (Signed)
TOTAL HIP ADMISSION H&P  Patient is admitted for left total hip arthroplasty.  Subjective:  Chief Complaint: left hip pain  HPI: Theresa Nolan, 62 y.o. female, has a history of pain and functional disability in the left hip(s) due to arthritis and patient has failed non-surgical conservative treatments for greater than 12 weeks to include NSAID's and/or analgesics, corticosteriod injections, flexibility and strengthening excercises, use of assistive devices, weight reduction as appropriate and activity modification.  Onset of symptoms was gradual starting 3 years ago with gradually worsening course since that time.The patient noted no past surgery on the left hip(s).  Patient currently rates pain in the left hip at 10 out of 10 with activity. Patient has night pain, worsening of pain with activity and weight bearing, trendelenberg gait, pain that interfers with activities of daily living and pain with passive range of motion. Patient has evidence of subchondral sclerosis, periarticular osteophytes and joint space narrowing by imaging studies. This condition presents safety issues increasing the risk of falls.  There is no current active infection.  Patient Active Problem List   Diagnosis Date Noted  . Unilateral primary osteoarthritis, left hip 12/28/2017  . Cholecystitis 05/23/2014  . Acute cholecystitis   . HYPERLIPIDEMIA-MIXED 07/17/2009  . HYPERTENSION, BENIGN 07/17/2009  . SYNCOPE AND COLLAPSE 07/17/2009  . EDEMA 07/17/2009   Past Medical History:  Diagnosis Date  . Chronic back pain   . Degenerative joint disease   . GERD (gastroesophageal reflux disease)   . Hypercholesteremia   . Hypertension   . Hypothyroidism   . Lacunar infarction ([place])   . Stroke ([place])   . TSH elevation     Past Surgical History:  Procedure Laterality Date  . ABDOMINAL HYSTERECTOMY     ovaries remain   . APPENDECTOMY  1989-1990  . CARDIAC CATHETERIZATION     10/08/04: Normal coronareis with  aberrant origin of CX. NL LVF. EF 60%.  . CHOLECYSTECTOMY N/A 05/24/2014   Procedure: LAPAROSCOPIC CHOLECYSTECTOMY WITH INTRAOPERATIVE CHOLANGIOGRAM;  Surgeon: Coralie Keens, MD;  Location: [place];  Service: General;  Laterality: N/A;  . HERNIA REPAIR     umbilical hernia repair as a child  . TONSILLECTOMY      Current Facility-Administered Medications  Medication Dose Route Frequency Provider Last Rate Last Dose  . ceFAZolin (ANCEF) 2-4 GM/100ML-% IVPB           . ceFAZolin (ANCEF) 3 g in dextrose 5 % 50 mL IVPB  3 g Intravenous To SS-Surg Mcarthur Rossetti, MD      . tranexamic acid (CYKLOKAPRON) IVPB 1,000 mg  1,000 mg Intravenous To OR Mcarthur Rossetti, MD       Allergies  Allergen Reactions  . Lisinopril Cough    Social History   Tobacco Use  . Smoking status: Former Research scientist (life sciences)  . Smokeless tobacco: Never Used  . Tobacco comment: quit over 30 years ago  Substance Use Topics  . Alcohol use: Yes    Alcohol/week: 1.0 standard drinks    Types: 1 Glasses of wine per week    Comment: occasionally     History reviewed. No pertinent family history.   Review of Systems  Musculoskeletal: Positive for joint pain.  All other systems reviewed and are negative.   Objective:  Physical Exam  Constitutional: She is oriented to person, place, and time. She appears well-developed and well-nourished.  HENT:  Head: Normocephalic and atraumatic.  Eyes: Pupils are equal, round, and reactive to light. EOM are normal.  Neck:  Normal range of motion.  Cardiovascular: Normal rate.  Respiratory: Effort normal.  GI: Soft.  Musculoskeletal:       Left hip: She exhibits decreased range of motion, decreased strength, tenderness and bony tenderness.  Neurological: She is alert and oriented to person, place, and time.  Skin: Skin is warm and dry.  Psychiatric: She has a normal mood and affect.    Vital signs in last 24 hours: Temp:  [97.8 F (36.6 C)] 97.8 F (36.6 C) (12/10  0933) Pulse Rate:  [72] 72 (12/10 0933) Resp:  [18] 18 (12/10 0933) BP: (153)/(72) 153/72 (12/10 0933) SpO2:  [96 %] 96 % (12/10 0933)  Labs:   Estimated body mass index is 40.64 kg/m as calculated from the following:   Height as of 12/14/17: 5\' 6"  (1.676 m).   Weight as of 12/14/17: 114.2 kg.   Imaging Review Plain radiographs demonstrate severe degenerative joint disease of the left hip(s). The bone quality appears to be good for age and reported activity level.    Preoperative templating of the joint replacement has been completed, documented, and submitted to the Operating Room personnel in order to optimize intra-operative equipment management.     Assessment/Plan:  End stage arthritis, left hip(s)  The patient history, physical examination, clinical judgement of the provider and imaging studies are consistent with end stage degenerative joint disease of the left hip(s) and total hip arthroplasty is deemed medically necessary. The treatment options including medical management, injection therapy, arthroscopy and arthroplasty were discussed at length. The risks and benefits of total hip arthroplasty were presented and reviewed. The risks due to aseptic loosening, infection, stiffness, dislocation/subluxation,  thromboembolic complications and other imponderables were discussed.  The patient acknowledged the explanation, agreed to proceed with the plan and consent was signed. Patient is being admitted for inpatient treatment for surgery, pain control, PT, OT, prophylactic antibiotics, VTE prophylaxis, progressive ambulation and ADL's and discharge planning.The patient is planning to be discharged home with home health services

## 2017-12-28 NOTE — Op Note (Signed)
NAME: Theresa Nolan, Theresa Nolan MEDICAL RECORD [id number] ACCOUNT [id number] DATE OF BIRTH:01/27/1955 FACILITY: [facility] LOCATION: [facility] PHYSICIAN:Yuridia Couts [place], MD  OPERATIVE REPORT  DATE OF PROCEDURE:  12/28/2017  PREOPERATIVE DIAGNOSIS:  Primary osteoarthritis and degenerative joint disease, left hip.  POSTOPERATIVE DIAGNOSIS:  Primary osteoarthritis and degenerative joint disease, left hip.  PROCEDURE:  Left total hip arthroplasty through direct anterior approach.  IMPLANTS:  DePuy Sector Gription acetabular component size 50, size 32+0 neutral polyethylene liner, with a single screw in the acetabulum, size 10 Corail femoral component with varus offset, size 32+5 metal hip ball.  SURGEON:  Lind Guest. Ninfa Linden, MD  ASSISTANT:  Erskine Emery, PA-C.  ANESTHESIA:  Spinal.  ANTIBIOTICS:  Two grams IV Ancef.  ESTIMATED BLOOD LOSS:  633 mL  COMPLICATIONS:  None.  INDICATIONS:  The patient is a morbidly obese and very pleasant 62 year old female with debilitating arthritis involving her left hip.  She has tried and failed all forms of conservative treatment and now presents for total hip arthroplasty.  At this  point, her pain is 10/10 and it is daily.  Her left hip pain has detrimentally affected her activities of daily living, quality of life and her mobility.  At this point, she does wish to proceed with surgery.  She understands the risk of acute blood loss  anemia, nerve or vessel injury, fracture, infection, dislocation, DVT and implant failure.  She understands all these are heightened given her obesity.  She understands our goals are to decrease pain, improve mobility and overall improve quality of  life.  DESCRIPTION OF PROCEDURE:  After informed consent was obtained and appropriate left hip was marked, she was brought to the operating room and sat up on a stretcher where spinal anesthesia was then obtained.  She was then laid in a supine position on a  stretcher.   Foley catheter was placed and traction boots were placed on both her feet.  Next, she was placed supine on the Hana fracture table, the perineal post in place and both legs in line skeletal traction device with no traction applied.  Her left  operative hip was prepped and draped with DuraPrep and sterile drapes.  A time-out was called.  She was identified, correct patient, correct left hip.  I then made an incision just inferior and posterior to the anterior superior iliac spine and carried  this obliquely down the leg.  I dissected down tensor fascia lata muscle.  Tensor fascia was then divided longitudinally to proceed with direct anterior approach to the hip.  We identified and cauterized circumflex vessels.  I then identified the hip  capsule, opened the hip capsule in an L-type format, finding moderate joint effusion and significant arthritis around her left hip.  We placed Cobra retractors around the medial and lateral femoral neck and made our femoral neck cut with an oscillating  saw just proximal to the lesser trochanter and completed this with an osteotome.  We placed a corkscrew guide in the femoral head and removed the femoral heads entirety and found a large area devoid of cartilage.  I then cleaned the acetabulum remnants  of the acetabular labrum and other debris.  We then began reaming under direct visualization from a size 44 reamer in stepwise increments up to a size 49 with all reamers under direct visualization, the last reamer under direct fluoroscopy, so we could  obtain our depth of reaming, our inclination and anteversion.  We then placed the real DePuy Sector Gription acetabular component size  50 and a single screw.  We placed a 32+0 polyethylene liner for that size acetabular component.  Attention was then  turned to the femur.  With the leg externally rotated to 120 degrees, extended and adducted we placed a Mueller retractor medially and a Hohmann retractor behind the greater  trochanter.  We released lateral joint capsule and used a box-cutting osteotome  to enter femoral canal and a rongeur to lateralize then began broaching from a size 8 broach using Corail broaching system going up to a size 10.  With a size 10 in place, we trialed a standard offset femoral neck and a 32+1 hip ball reducing the  acetabulum and it was significantly longer on that side, but stable.  We dislocated the hip and removed the trial components.  We decided to go with a varus offset femoral neck due to needing to tighten her slightly and work on her leg lengths.  We  placed the real Corail femoral component size 10 with varus offset, and then I went with a 32+5 metal hip ball.  My rationale was this was going to leave her longer, but I did not like the fact that her knee hyperextends and she lies in externally  rotated position and she is certainly at risk for pressure on the hip and morbid dislocation.  We went with a +5 ball then to make her longer.  We reduced this in the acetabulum and it was definitely stable and tight with leg length discrepancy with her  being longer, but I felt better with this.  Again, based on her morbid obesity, the fact that she lies with her foot in an externally rotated position and due to the fact that her knee hyperextends.  We assessed her hip under direct fluoroscopy and I was  pleased otherwise.  We then irrigated the soft tissue with normal saline solution using pulsatile lavage.  We closed the joint capsule with interrupted #1 Ethibond suture, followed by running 0 Vicryl and tensor fascia, 0 Vicryl in the deep tissue, 2-0  Vicryl subcutaneous tissue and interrupted 2-0 nylon on the skin.  Xeroform and well-padded sterile dressing was applied.  She was taken to recovery room in stable condition.  All final counts were correct.  There were no complications noted.  Of note,  Benita Stabile, PA-C, assisted the entire case.  His assistance was crucial for facilitating all  aspects of this case.  TN/NUANCE  D:12/28/2017 T:12/28/2017 JOB:[id number]

## 2017-12-28 NOTE — Brief Op Note (Signed)
12/28/2017  12:53 PM  PATIENT:  Dorena Cookey  62 y.o. female  PRE-OPERATIVE DIAGNOSIS:  osteoarthritis left hip  POST-OPERATIVE DIAGNOSIS:  osteoarthritis left hip  PROCEDURE:  Procedure(s): LEFT TOTAL HIP ARTHROPLASTY ANTERIOR APPROACH (Left)  SURGEON:  Surgeon(s) and Role:    Mcarthur Rossetti, MD - Primary  PHYSICIAN ASSISTANT: Benita Stabile, PA-C  ANESTHESIA:   spinal  EBL:  300 mL   COUNTS:  YES  DICTATION: .Other Dictation: Dictation Number [id number]  PLAN OF CARE: Admit to inpatient   PATIENT DISPOSITION:  PACU - hemodynamically stable.   Delay start of Pharmacological VTE agent (>24hrs) due to surgical blood loss or risk of bleeding: no

## 2017-12-28 NOTE — Transfer of Care (Signed)
Immediate Anesthesia Transfer of Care Note  Patient: Theresa Nolan  Procedure(s) Performed: LEFT TOTAL HIP ARTHROPLASTY ANTERIOR APPROACH (Left )  Patient Location: PACU  Anesthesia Type:Spinal  Level of Consciousness: awake, alert  and oriented  Airway & Oxygen Therapy: Patient Spontanous Breathing and Patient connected to face mask oxygen  Post-op Assessment: Report given to RN and Post -op Vital signs reviewed and stable  Post vital signs: Reviewed and stable  Last Vitals:  Vitals Value Taken Time  BP 117/67 12/28/2017  1:27 PM  Temp    Pulse 69 12/28/2017  1:30 PM  Resp 14 12/28/2017  1:30 PM  SpO2 96 % 12/28/2017  1:30 PM  Vitals shown include unvalidated device data.  Last Pain:  Vitals:   12/28/17 0948  TempSrc:   PainSc: 5       Patients Stated Pain Goal: 3 (81/38/87 1959)  Complications: No apparent anesthesia complications

## 2017-12-29 ENCOUNTER — Other Ambulatory Visit: Payer: Self-pay

## 2017-12-29 ENCOUNTER — Encounter (HOSPITAL_COMMUNITY): Payer: Self-pay | Admitting: Orthopaedic Surgery

## 2017-12-29 LAB — BASIC METABOLIC PANEL
Anion gap: 10 (ref 5–15)
BUN: 17 mg/dL (ref 8–23)
CHLORIDE: 104 mmol/L (ref 98–111)
CO2: 25 mmol/L (ref 22–32)
Calcium: 8.7 mg/dL — ABNORMAL LOW (ref 8.9–10.3)
Creatinine, Ser: 1.07 mg/dL — ABNORMAL HIGH (ref 0.44–1.00)
GFR calc Af Amer: >60 mL/min (ref >60)
GFR calc non Af Amer: 56 mL/min — ABNORMAL LOW (ref >60)
Glucose, Bld: 173 mg/dL — ABNORMAL HIGH (ref 70–99)
POTASSIUM: 3.6 mmol/L (ref 3.5–5.1)
Sodium: 139 mmol/L (ref 135–145)

## 2017-12-29 LAB — CBC
HEMATOCRIT: 35.5 % — AB (ref 36.0–46.0)
Hemoglobin: 11 g/dL — ABNORMAL LOW (ref 12.0–15.0)
MCH: 28.6 pg (ref 26.0–34.0)
MCHC: 31 g/dL (ref 30.0–36.0)
MCV: 92.4 fL (ref 80.0–100.0)
Platelets: 332 10*3/uL (ref 150–400)
RBC: 3.84 MIL/uL — ABNORMAL LOW (ref 3.87–5.11)
RDW: 12.6 % (ref 11.5–15.5)
WBC: 15.1 10*3/uL — ABNORMAL HIGH (ref 4.0–10.5)
nRBC: 0 % (ref 0.0–0.2)

## 2017-12-29 NOTE — Evaluation (Signed)
Physical Therapy Evaluation Patient Details Name: Theresa Nolan MRN: [id number] DOB: 1955/07/18 Today's Date: 12/29/2017   History of Present Illness  Pt is a 62yo female s/p L direct anterior THA. PMH: HTN, osteoarthritis.  Clinical Impression  Pt is s/p THA resulting in the deficits listed below (see PT Problem List). Pt will need to be mod I prior to d/c for safe d/c home due to patient being home alone during the day. Pt will benefit from skilled PT to increase their independence and safety with mobility to allow discharge to the venue listed below.      Follow Up Recommendations Home health PT;Supervision/Assistance - 24 hour    Equipment Recommendations  Rolling walker with 5" wheels;3in1 (PT)    Recommendations for Other Services       Precautions / Restrictions Precautions Precautions: Fall Restrictions Weight Bearing Restrictions: Yes LLE Weight Bearing: Weight bearing as tolerated      Mobility  Bed Mobility Overal bed mobility: Needs Assistance Bed Mobility: Supine to Sit     Supine to sit: Mod assist     General bed mobility comments: HOB elevated, max directional v/c's for L LE mvmnt, modA for L LE management and to scoot to EOB  Transfers Overall transfer level: Needs assistance Equipment used: Rolling walker (2 wheeled) Transfers: Sit to/from Stand Sit to Stand: Mod assist         General transfer comment: verbal cues for hand placement, modA for intial power up and to steady during transition of hands from bed to RW  Ambulation/Gait Ambulation/Gait assistance: Min assist Gait Distance (Feet): 12 Feet(x2, to/from bathroom) Assistive device: Rolling walker (2 wheeled) Gait Pattern/deviations: Step-to pattern;Decreased stride length;Decreased step length - left;Decreased stance time - left;Decreased dorsiflexion - left Gait velocity: slow Gait velocity interpretation: <1.31 ft/sec, indicative of household ambulator General Gait Details: pt  having to hip hike to advance L LE, pt unable to clear L foot at this time. Pt sliding foot along floor even with hip hike. Pt quickly fatigues  Stairs            Wheelchair Mobility    Modified Rankin (Stroke Patients Only)       Balance Overall balance assessment: Needs assistance Sitting-balance support: Feet supported;No upper extremity supported Sitting balance-Leahy Scale: Good     Standing balance support: Bilateral upper extremity supported Standing balance-Leahy Scale: Poor Standing balance comment: dependent on RW                             Pertinent Vitals/Pain Pain Assessment: 0-10 Pain Score: 4  Pain Location: L hip Pain Descriptors / Indicators: Aching;Burning;Dull Pain Intervention(s): Monitored during session    Home Living Family/patient expects to be discharged to:: Private residence Living Arrangements: Alone(will be going home with dtr) Available Help at Discharge: Family;Available PRN/intermittently(dtr works during the day) Type of Home: House Home Access: Level entry     Home Layout: One [place]: None      Prior Function Level of Independence: Independent         Comments: 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)    Extremity/Trunk Assessment   Upper Extremity Assessment Upper Extremity Assessment: Overall WFL for tasks assessed    Lower Extremity Assessment Lower Extremity Assessment: LLE deficits/detail LLE Deficits / Details: able to initiate quad set and glut set    Cervical / Trunk Assessment Cervical / Trunk  Assessment: Normal  Communication   Communication: No difficulties  Cognition Arousal/Alertness: Awake/alert Behavior During Therapy: WFL for tasks assessed/performed Overall Cognitive Status: Within Functional Limits for tasks assessed                                        General Comments General comments (skin integrity, edema, etc.):  pt with L anterior hip dressing intact    Exercises General Exercises - Lower Extremity Ankle Circles/Pumps: AROM;10 reps;20 reps;Supine Quad Sets: AROM;Left;5 reps;Supine(with 5 sec hold) Gluteal Sets: AROM;Both;5 reps;Sidelying Long Arc Quad: AROM;Left;10 reps;Seated   Assessment/Plan    PT Assessment Patient needs continued PT services  PT Problem List Decreased strength;Decreased range of motion;Decreased activity tolerance;Decreased balance;Decreased mobility;Decreased coordination;Decreased cognition;Decreased knowledge of use of DME;Decreased safety awareness;Decreased knowledge of precautions;Pain;Obesity       PT Treatment Interventions DME instruction;Stair training;Gait training;Functional mobility training;Therapeutic activities;Therapeutic exercise;Balance training;Neuromuscular re-education    PT Goals (Current goals can be found in the Care Plan section)  Acute Rehab PT Goals Patient Stated Goal: home PT Goal Formulation: With patient Time For Goal Achievement: 01/12/18 Potential to Achieve Goals: Good    Frequency 7X/week   Barriers to discharge Decreased caregiver support will be home alone    Co-evaluation               AM-PAC PT "6 Clicks" Mobility  Outcome Measure Help needed turning from your back to your side while in a flat bed without using bedrails?: A Little Help needed moving from lying on your back to sitting on the side of a flat bed without using bedrails?: A Little Help needed moving to and from a bed to a chair (including a wheelchair)?: A Little Help needed standing up from a chair using your arms (e.g., wheelchair or bedside chair)?: A Little Help needed to walk in hospital room?: A Little Help needed climbing 3-5 steps with a railing? : A Lot 6 Click Score: 17    End of Session Equipment Utilized During Treatment: Gait belt Activity Tolerance: Patient limited by fatigue(quickly fatigues, increased pain from 4-8) Patient left: in  chair;with call bell/phone within reach;with nursing/sitter in room Nurse Communication: Mobility status PT Visit Diagnosis: Unsteadiness on feet (R26.81);Pain;Difficulty in walking, not elsewhere classified (R26.2) Pain - Right/Left: Left Pain - part of body: Hip    Time: 6195-0932 PT Time Calculation (min) (ACUTE ONLY): 34 min   Charges:   PT Evaluation $PT Eval Moderate Complexity: 1 Mod PT Treatments $Gait Training: 8-22 mins        Kittie Plater, PT, DPT Acute Rehabilitation Services Pager #: [phone] Office #: [phone]   [place] 12/29/2017, 9:30 AM

## 2017-12-29 NOTE — Progress Notes (Signed)
Occupational Therapy Evaluation Patient Details Name: Theresa Nolan MRN: [id number] DOB: [birth year] Today's Date: 12/29/2017  Clinical Impression: PTA Pt independent in ADL and mobility. Pt is currently min assist with vc for safe transfers from elevated surfaces, set up for UB and LB dressing, set up for UB bathing - max A for LB dressing. Pt also presents with decreased activity tolerance fatiguing quickly with ambulation from bathroom to bed. Pt also requires significant assist for return to supine in the bed. Pt will require continued OT in the acute setting and post-acute therapy as well to address the OT problem list (see below). At this time the Pt will require 24 hour supervision/assist - which I am not sure can be provided by the family at this time. I also believe that the patient would benefit from increased amount of therapy at a rehab facility.    12/29/17 1100  OT Visit Information  Last OT Received On 12/29/17  Assistance Needed +1  History of Present Illness Pt is a 62yo female s/p L direct anterior THA.  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, mining No difficulties  Pain Assessment  Pain Assessment Faces  Faces Pain Scale 6  Pain Location L hip  Pain Descriptors / Indicators Aching;Burning;Dull;Grimacing  Pain Intervention(s) Monitored during session;Repositioned;Ice applied  Cognition   Arousal/Alertness Awake/alert  Behavior During Therapy WFL for tasks assessed/performed  Overall Cognitive Status Within Functional Limits for tasks assessed  Upper Extremity Assessment  Upper Extremity Assessment Overall WFL for tasks assessed  Lower Extremity Assessment  Lower Extremity Assessment Defer to PT evaluation  Cervical / Trunk Assessment  Cervical / Trunk Assessment Normal  ADL  Overall ADL's  Needs assistance/impaired  Eating/Feeding Modified independent;Sitting  Grooming Wash/dry hands;Wash/dry face;Oral care;Set up;Sitting  Grooming Details (indicate cue type and reason) sink, sitting on 3 in 1  Upper Body Bathing Set up;Sitting  Lower Body Bathing Sitting/lateral leans;Min guard  Lower Body Bathing Details (indicate cue type and reason) unable to get feet  Upper Body Dressing  Set up;Sitting  Upper Body Dressing Details (indicate cue type and reason) to don hospital gown  Lower Body Dressing Sit to/from stand;Moderate assistance  Lower Body Dressing Details (indicate cue type and reason) Pt unable to perform figure 4 position for dressing, max A to manage clothing from feet to knees, and dependent on RW for balance in standing  Toilet Transfer Moderate assistance;Ambulation;RW  Toilet Transfer Details (indicate cue type and reason) fatigues quickly  Toileting- Water quality scientist and Hygiene Sit to/from stand;Moderate assistance  Functional mobility during ADLs Moderate assistance;Rolling walker  General ADL Comments decreased access to LB for ADL  Vision- History  Patient Visual Report No change from baseline  Bed Mobility  Overal bed mobility Needs Assistance  Bed Mobility Sit to Supine  Sit to supine Mod assist;HOB elevated  General bed mobility comments Assist for BLE back into bed, heavy use of bed rails  Transfers  Overall transfer level Needs  assistance  Equipment used Rolling walker (2 wheeled)  Transfers Sit to/from Stand  Sit to [employer] transfer comment min A for steady/balance and slight boost from [facility]   Balance  Overall balance assessment Needs assistance  Sitting-balance support Feet supported;No upper extremity supported  Sitting balance-Leahy Scale Good  Standing balance support Bilateral upper extremity supported  Standing balance-Leahy Scale Poor  Standing balance comment dependent on RW  OT - End of Session  Equipment Utilized During Treatment Gait belt;Rolling walker  Activity Tolerance Patient tolerated treatment well  Patient left with call bell/phone within reach;in bed;with family/visitor present;with SCD's reapplied  Nurse Communication Mobility status;Weight bearing status  OT Assessment  OT Recommendation/Assessment Patient needs continued OT Services  OT Visit Diagnosis Unsteadiness on feet (R26.81);Other abnormalities of gait and mobility (R26.89);Pain  Pain - Right/Left Left  Pain - part of body Hip  OT Problem List Decreased range of motion;Decreased activity tolerance;Impaired balance (sitting and/or standing);Decreased knowledge of use of DME or AE;Obesity;Pain  Barriers to Discharge Comments Pt would be alone during the day - good family support but they work  OT Plan  OT Frequency (ACUTE ONLY) Min 2X/week  OT Treatment/Interventions (ACUTE ONLY) Self-care/ADL training;DME and/or AE instruction;Energy conservation;Therapeutic activities;Patient/family education;Balance training  AM-PAC OT "6 Clicks" Daily Activity Outcome Measure (Version 2)  Help from another person eating meals? 4  Help from another person taking care of personal grooming? 3  Help from another person toileting, which includes using toliet, bedpan, or urinal? 2  Help from another person bathing (including washing, rinsing, drying)? 3  Help from another person to put on and taking off regular upper body clothing? 4  Help from another person to put on and taking off regular lower body clothing? 2  6 Click Score 18  OT  Recommendation  Follow Up Recommendations Follow surgeon's recommendation for DC plan and follow-up therapies;Supervision/Assistance - 24 hour (initially)  OT Equipment 3 in 1 bedside commode;Other (comment) (AE)  Individuals Consulted  Consulted and Agree with Results and Recommendations Patient  Acute Rehab OT Goals  Patient Stated Goal to be able to go home  OT Goal Formulation With patient  Time For Goal Achievement 01/12/18  Potential to Achieve Goals Good  OT Time Calculation  OT Start Time (ACUTE ONLY) 1407  OT Stop Time (ACUTE ONLY) 1447  OT Time Calculation (min) 40 min  OT General Charges  $OT Visit 1 Visit  OT Evaluation  $OT Eval Moderate Complexity 1 Mod  OT Treatments  $Self Care/Home Management  8-22 mins  $Therapeutic Activity 8-22 mins  Written Expression  Dominant Hand  (ambidextrous)   Hulda Humphrey OTR/L Acute Rehabilitation Services Pager: [phone] Office: [phone]

## 2017-12-29 NOTE — Progress Notes (Signed)
Subjective: 1 Day Post-Op Procedure(s) (LRB): LEFT TOTAL HIP ARTHROPLASTY ANTERIOR APPROACH (Left) Patient reports pain as moderate.    Objective: Vital signs in last 24 hours: Temp:  [97.5 F (36.4 C)-98.8 F (37.1 C)] 98.5 F (36.9 C) (12/11 0336) Pulse Rate:  [62-99] 92 (12/11 0336) Resp:  [12-18] 18 (12/11 0336) BP: (117-153)/(63-93) 140/67 (12/11 0336) SpO2:  [96 %-100 %] 98 % (12/11 0336) Weight:  [114.2 kg] 114.2 kg (12/10 1132)  Intake/Output from previous day: 12/10 0701 - 12/11 0700 In: 3009.2 [P.O.:500; I.V.:1959.2; IV Piggyback:350] Out: 5075 [Urine:4775; Blood:300] Intake/Output this shift: No intake/output data recorded.  Recent Labs    12/29/17 0246  HGB 11.0*   Recent Labs    12/29/17 0246  WBC 15.1*  RBC 3.84*  HCT 35.5*  PLT 332   Recent Labs    12/29/17 0246  NA 139  K 3.6  CL 104  CO2 25  BUN 17  CREATININE 1.07*  GLUCOSE 173*  CALCIUM 8.7*   No results for input(s): LABPT, INR in the last 72 hours.  Sensation intact distally Intact pulses distally Dorsiflexion/Plantar flexion intact Incision: dressing C/D/I  Assessment/Plan: 1 Day Post-Op Procedure(s) (LRB): LEFT TOTAL HIP ARTHROPLASTY ANTERIOR APPROACH (Left) Up with therapy Discharge home with home health next 1-2 days.    Mcarthur Rossetti 12/29/2017, 7:49 AM

## 2017-12-29 NOTE — Progress Notes (Signed)
Physical Therapy Treatment Patient Details Name: Theresa Nolan MRN: [id number] DOB: 08/11/1955 Today's Date: 12/29/2017    History of Present Illness Pt is a 62yo female s/p L direct anterior THA.    PT Comments    Pt presents s/p L direct anterior THA. Pt amb 30 feet with RW, requires minA for progression and clearance of L LE at beginning of amb, without assist pt cannot clear foot. Pt will need to be mod I prior to d/c for safe d/c to daughter's home due to patient being home alone during the day since daughter works. If pt cannot achieve safe mod I function, will need either longer stay or ST-SNF to achieve mod I. Pt will benefit from skilled PT to increase her independence and safety with mobility to allow discharge to the home with daughter and [facility] PT.   Follow Up Recommendations  Home health PT;Supervision/Assistance - 24 hour     Equipment Recommendations  Rolling walker with 5" wheels;3in1 (PT)    Recommendations for Other Services       Precautions / Restrictions Precautions Precautions: Fall Restrictions Weight Bearing Restrictions: Yes LLE Weight Bearing: Weight bearing as tolerated    Mobility  Bed Mobility               General bed mobility comments: PT OOB in recliner at beginning of session, left with OT at end of session  Transfers Overall transfer level: Needs assistance Equipment used: Rolling walker (2 wheeled) Transfers: Sit to/from Stand Sit to Stand: Min guard         General transfer comment: Increased time, pt used RW to power trunk upright  Ambulation/Gait Ambulation/Gait assistance: Min assist Gait Distance (Feet): 30 Feet Assistive device: Rolling walker (2 wheeled)   Gait velocity: decreased Gait velocity interpretation: <1.31 ft/sec, indicative of household ambulator General Gait Details: Pt requires minA for progression and clearance of L LE for beginning of amb, without assist pt cannot clear foot.  Stairs              Wheelchair Mobility    Modified Rankin (Stroke Patients Only)       Balance Overall balance assessment: Needs assistance Sitting-balance support: Feet supported;No upper extremity supported Sitting balance-Leahy Scale: Good     Standing balance support: Bilateral upper extremity supported Standing balance-Leahy Scale: Poor Standing balance comment: dependent on RW                            Cognition Arousal/Alertness: Awake/alert Behavior During Therapy: WFL for tasks assessed/performed Overall Cognitive Status: Within Functional Limits for tasks assessed                                        Exercises      General Comments        Pertinent Vitals/Pain Pain Assessment: No/denies pain Pain Score: 4  Pain Location: up to 8 with mobility, L hip incision site Pain Descriptors / Indicators: Burning Pain Intervention(s): Monitored during session    Home Living Family/patient expects to be discharged to:: Private residence Living Arrangements: Alone(will be going home with dtr) Available Help at Discharge: Family;Available PRN/intermittently(dtr works during the day) Type of Home: House Home Access: Level entry   Home Layout: One [place]: None      Prior Function Level of Independence: Independent  Comments: works at [employer] (current goals can now be found in the care plan section) Acute Rehab PT Goals Patient Stated Goal: home PT Goal Formulation: With patient Time For Goal Achievement: 01/12/18 Potential to Achieve Goals: Good    Frequency    7X/week      PT Plan      Co-evaluation              AM-PAC PT "6 Clicks" Mobility   Outcome Measure  Help needed turning from your back to your side while in a flat bed without using bedrails?: A Little Help needed moving from lying on your back to sitting on the side of a flat bed without using bedrails?: A Little Help needed moving to and  from a bed to a chair (including a wheelchair)?: A Little Help needed standing up from a chair using your arms (e.g., wheelchair or bedside chair)?: A Little Help needed to walk in hospital room?: A Little Help needed climbing 3-5 steps with a railing? : A Lot 6 Click Score: 17    End of Session Equipment Utilized During Treatment: Gait belt Activity Tolerance: Patient limited by fatigue Patient left: in chair(with OT for bathing) Nurse Communication: Mobility status PT Visit Diagnosis: Unsteadiness on feet (R26.81);Pain;Difficulty in walking, not elsewhere classified (R26.2) Pain - Right/Left: Left Pain - part of body: Hip     Time: 1355-1410 PT Time Calculation (min) (ACUTE ONLY): 15 min  Charges:  $Gait Training: 8-22 mins                     Gilda Crease, SPT Acute Rehab Services [place] 12/29/2017, 2:46 PM

## 2017-12-29 NOTE — Plan of Care (Signed)
  Problem: Education: Goal: Knowledge of General Education information will improve Description Including pain rating scale, medication(s)/side effects and non-pharmacologic comfort measures Outcome: Progressing   Problem: Health Behavior/Discharge Planning: Goal: Ability to manage health-related needs will improve Outcome: Progressing   Problem: Clinical Measurements: Goal: Ability to maintain clinical measurements within normal limits will improve Outcome: Progressing Goal: Will remain free from infection Outcome: Progressing Note:  Pt receiving IV antibiotics.  Goal: Diagnostic test results will improve Outcome: Progressing Goal: Respiratory complications will improve Outcome: Progressing   Problem: Activity: Goal: Risk for activity intolerance will decrease Outcome: Progressing   Problem: Nutrition: Goal: Adequate nutrition will be maintained Outcome: Progressing   Problem: Coping: Goal: Level of anxiety will decrease Outcome: Progressing   Problem: Elimination: Goal: Will not experience complications related to bowel motility Outcome: Progressing Goal: Will not experience complications related to urinary retention Outcome: Progressing   Problem: Safety: Goal: Ability to remain free from injury will improve Outcome: Progressing Note:  Pt remains free of falls. Bed locked and in lowest position. Call bell within reach. Will continue to monitor.

## 2017-12-30 MED ORDER — ASPIRIN 81 MG PO CHEW: 81.0000 mg | CHEWABLE_TABLET | Freq: Two times a day (BID) | ORAL | 0 refills | Status: DC

## 2017-12-30 MED ORDER — METHOCARBAMOL 500 MG PO TABS: 500.0000 mg | ORAL_TABLET | Freq: Four times a day (QID) | ORAL | 1 refills | Status: DC | PRN

## 2017-12-30 MED ORDER — OXYCODONE HCL 5 MG PO TABS: 5.0000 mg | ORAL_TABLET | ORAL | 0 refills | Status: DC | PRN

## 2017-12-30 MED FILL — METHOCARBAMOL 500 MG TABS: 500 | 10 days supply | Qty: 40 | Fill #0

## 2017-12-30 MED FILL — ASPIRIN CHILD 81 MG TAB CHE: 81 | 18 days supply | Qty: 35 | Fill #0

## 2017-12-30 MED FILL — oxyCODONE HCL 5 MG TABS: 5 | 3 days supply | Qty: 40 | Fill #0

## 2017-12-30 NOTE — Progress Notes (Signed)
Physical Therapy Treatment Patient Details Name: Theresa Nolan MRN: [id number] DOB: 1955/08/27 Today's Date: 12/30/2017    History of Present Illness Pt is a 62yo female s/p L direct anterior THA.    PT Comments    Patient seen for mobility progression. Pt is making progressing well toward PT goals and tolerated gait training for 160 ft this session. Pt requires min guard for functional transfers and ambulation with RW. Continue to progress as tolerated.    Follow Up Recommendations  Home health PT;Supervision/Assistance - 24 hour     Equipment Recommendations  Rolling walker with 5" wheels;3in1 (PT)    Recommendations for Other Services       Precautions / Restrictions Precautions Precautions: Fall Restrictions Weight Bearing Restrictions: Yes LLE Weight Bearing: Weight bearing as tolerated    Mobility  Bed Mobility Overal bed mobility: Needs Assistance Bed Mobility: Supine to Sit     Supine to sit: Supervision     General bed mobility comments: supervision for safety; use of rail   Transfers Overall transfer level: Needs assistance Equipment used: Rolling walker (2 wheeled) Transfers: Sit to/from Stand Sit to Stand: Min guard         General transfer comment: cues for safe hand placement; min guard for safety from EOB   Ambulation/Gait Ambulation/Gait assistance: Min guard Gait Distance (Feet): 160 Feet Assistive device: Rolling walker (2 wheeled) Gait Pattern/deviations: Step-to pattern;Decreased stride length;Decreased stance time - left;Decreased dorsiflexion - left;Step-through pattern;Decreased step length - right;Decreased weight shift to left Gait velocity: decreased   General Gait Details: cues for sequencing, increased L hip/knee flexion during swing phase, and L heel strike/toe off; pt with improved L LE weight bearingin and step length symmetry with increased distance   Stairs             Wheelchair Mobility    Modified Rankin  (Stroke Patients Only)       Balance Overall balance assessment: Needs assistance Sitting-balance support: Feet supported;No upper extremity supported Sitting balance-Leahy Scale: Good     Standing balance support: Bilateral upper extremity supported Standing balance-Leahy Scale: Poor                              Cognition Arousal/Alertness: Awake/alert Behavior During Therapy: WFL for tasks assessed/performed Overall Cognitive Status: Within Functional Limits for tasks assessed                                        Exercises      General Comments General comments (skin integrity, edema, etc.): daughter present      Pertinent Vitals/Pain Pain Assessment: Faces Faces Pain Scale: Hurts little more Pain Location: L hip Pain Descriptors / Indicators: Guarding;Sore Pain Intervention(s): Limited activity within patient's tolerance;Monitored during session;Repositioned;RN gave pain meds during session;Patient requesting pain meds-RN notified    Home Living                      Prior Function            PT Goals (current goals can now be found in the care plan section) Acute Rehab PT Goals Patient Stated Goal: home Progress towards PT goals: Progressing toward goals    Frequency    7X/week      PT Plan Current plan remains appropriate    Co-evaluation  AM-PAC PT "6 Clicks" Mobility   Outcome Measure  Help needed turning from your back to your side while in a flat bed without using bedrails?: A Little Help needed moving from lying on your back to sitting on the side of a flat bed without using bedrails?: A Little Help needed moving to and from a bed to a chair (including a wheelchair)?: A Little Help needed standing up from a chair using your arms (e.g., wheelchair or bedside chair)?: A Little Help needed to walk in hospital room?: A Little Help needed climbing 3-5 steps with a railing? : A Little 6  Click Score: 18    End of Session Equipment Utilized During Treatment: Gait belt Activity Tolerance: Patient tolerated treatment well Patient left: Other (comment);with call bell/phone within reach(pt seated on 3 in 1 in bathroom ) Nurse Communication: Mobility status PT Visit Diagnosis: Unsteadiness on feet (R26.81);Pain;Difficulty in walking, not elsewhere classified (R26.2) Pain - Right/Left: Left Pain - part of body: Hip     Time: 1010-1040 PT Time Calculation (min) (ACUTE ONLY): 30 min  Charges:  $Gait Training: 23-37 mins                     Earney Navy, PTA Acute Rehabilitation Services Pager: [phone] Office: [phone]     Darliss Cheney 12/30/2017, 11:02 AM

## 2017-12-30 NOTE — Addendum Note (Signed)
Addendum  created 12/30/17 0854 by Lidia Collum, MD   Child order released for a procedure order, Clinical Note Signed, Intraprocedure Blocks edited

## 2017-12-30 NOTE — Discharge Instructions (Signed)

## 2017-12-30 NOTE — Plan of Care (Signed)

## 2017-12-30 NOTE — Progress Notes (Signed)
Subjective: 2 Days Post-Op Procedure(s) (LRB): LEFT TOTAL HIP ARTHROPLASTY ANTERIOR APPROACH (Left) Patient reports pain as moderate.  Wanting to work with PT one more day.   Objective: Vital signs in last 24 hours: Temp:  [98 F (36.7 C)-98.3 F (36.8 C)] 98 F (36.7 C) (12/12 0900) Pulse Rate:  [79-105] 79 (12/12 0900) Resp:  [18-20] 20 (12/12 0531) BP: (102-151)/(58-93) 102/58 (12/12 0900) SpO2:  [98 %-100 %] 98 % (12/12 0900) Weight:  [114.2 kg] 114.2 kg (12/12 0820)  Intake/Output from previous day: 12/11 0701 - 12/12 0700 In: 240 [P.O.:240] Out: -  Intake/Output this shift: Total I/O In: 960 [P.O.:960] Out: -   Recent Labs    12/29/17 0246  HGB 11.0*   Recent Labs    12/29/17 0246  WBC 15.1*  RBC 3.84*  HCT 35.5*  PLT 332   Recent Labs    12/29/17 0246  NA 139  K 3.6  CL 104  CO2 25  BUN 17  CREATININE 1.07*  GLUCOSE 173*  CALCIUM 8.7*   No results for input(s): LABPT, INR in the last 72 hours.  Left lower extremity: Dorsiflexion/Plantar flexion intact Incision: dressing C/D/I Compartment soft    Assessment/Plan: 2 Days Post-Op Procedure(s) (LRB): LEFT TOTAL HIP ARTHROPLASTY ANTERIOR APPROACH (Left) Up with therapy  Plan discharge home tomorrow.     GILBERT CLARK 12/30/2017, 12:54 PM

## 2017-12-30 NOTE — Care Management Note (Addendum)
Case Management Note  Patient Details  Name: Theresa Nolan MRN: [id number] Date of Birth: Jan 12, 1956  Subjective/Objective:  62 yr old female s/p left total hip arthroplasty.                     Action/Plan: Case manager spoke with patient and her family concerning discharge plan and DME. Patient was preoperatively setup with [facility], no changes. DME has been ordered. Patient will be stay with her daughter for recovery, [address], [place], her cell#:[phone].    Expected Discharge Date:   12/31/17               Expected Discharge Plan:  [place]  In-House Referral:  NA  Discharge planning Services  CM Consult  Post Acute Care Choice:  Durable Medical Equipment, Home Health Choice offered to:  Patient, Adult Children  DME Arranged:  3-N-1, Walker rolling DME Agency:  [place]:  PT [place] Agency:  [facility] ([facility])  Status of Service:  Completed, signed off  If discussed at [employer], dates discussed:    Additional Comments:  Ninfa Meeker, RN 12/30/2017, 11:29 AM

## 2017-12-30 NOTE — Plan of Care (Signed)
  Problem: Education: Goal: Knowledge of General Education information will improve Description Including pain rating scale, medication(s)/side effects and non-pharmacologic comfort measures Outcome: Progressing   Problem: Health Behavior/Discharge Planning: Goal: Ability to manage health-related needs will improve Outcome: Progressing   Problem: Clinical Measurements: Goal: Ability to maintain clinical measurements within normal limits will improve Outcome: Progressing Goal: Will remain free from infection Outcome: Progressing Goal: Cardiovascular complication will be avoided Outcome: Progressing   Problem: Activity: Goal: Risk for activity intolerance will decrease Outcome: Progressing   Problem: Coping: Goal: Level of anxiety will decrease Outcome: Progressing   Problem: Elimination: Goal: Will not experience complications related to bowel motility Outcome: Progressing Goal: Will not experience complications related to urinary retention Outcome: Progressing   Problem: Safety: Goal: Ability to remain free from injury will improve Outcome: Progressing

## 2017-12-30 NOTE — Anesthesia Procedure Notes (Signed)
Spinal  Patient location during procedure: OR Staffing Anesthesiologist: Dhana Totton E, MD Performed: anesthesiologist  Preanesthetic Checklist Completed: patient identified, surgical consent, pre-op evaluation, timeout performed, IV checked, risks and benefits discussed and monitors and equipment checked Spinal Block Patient position: sitting Prep: site prepped and draped and DuraPrep Patient monitoring: continuous pulse ox, blood pressure and heart rate Approach: midline Location: L3-4 Injection technique: single-shot Needle Needle type: Pencan  Needle gauge: 24 G Needle length: 9 cm Additional Notes Functioning IV was confirmed and monitors were applied. Sterile prep and drape, including hand hygiene and sterile gloves were used. The patient was positioned and the spine was prepped. The skin was anesthetized with lidocaine.  Free flow of clear CSF was obtained prior to injecting local anesthetic into the CSF. The needle was carefully withdrawn. The patient tolerated the procedure well.      

## 2017-12-30 NOTE — Progress Notes (Signed)
Physical Therapy Treatment Patient Details Name: Theresa Nolan MRN: [id number] DOB: 08/22/1955 Today's Date: 12/30/2017    History of Present Illness Pt is a 62yo female s/p L direct anterior THA.    PT Comments    Patient seen second session for LE strengthening exercises. Pt tolerated session well and able to complete supine and seated exercises. HEP handout and frequency reviewed with pt. Continue to progress as tolerated.    Follow Up Recommendations  Home health PT;Supervision/Assistance - 24 hour     Equipment Recommendations  Rolling walker with 5" wheels;3in1 (PT)    Recommendations for Other Services       Precautions / Restrictions Precautions Precautions: Fall Restrictions Weight Bearing Restrictions: Yes LLE Weight Bearing: Weight bearing as tolerated    Mobility  Bed Mobility                  Transfers                    Ambulation/Gait                 Stairs             Wheelchair Mobility    Modified Rankin (Stroke Patients Only)       Balance Overall balance assessment: Needs assistance Sitting-balance support: Feet supported;No upper extremity supported Sitting balance-Leahy Scale: Good                                      Cognition Arousal/Alertness: Awake/alert Behavior During Therapy: WFL for tasks assessed/performed Overall Cognitive Status: Within Functional Limits for tasks assessed                                        Exercises Total Joint Exercises Ankle Circles/Pumps: AROM;Both;15 reps Quad Sets: AROM;Strengthening;Both;10 reps Short Arc Quad: AROM;Strengthening;Left;10 reps Heel Slides: AAROM;Strengthening;Left;10 reps Hip ABduction/ADduction: AROM;Strengthening;Left;10 reps Long Arc Quad: AROM;Strengthening;Left;10 reps    General Comments        Pertinent Vitals/Pain Pain Assessment: Faces Faces Pain Scale: Hurts little more Pain Location: L  hip Pain Descriptors / Indicators: Guarding;Sore Pain Intervention(s): Monitored during session;Premedicated before session;Limited activity within patient's tolerance;Repositioned    Home Living                      Prior Function            PT Goals (current goals can now be found in the care plan section) Acute Rehab PT Goals Patient Stated Goal: home Progress towards PT goals: Progressing toward goals    Frequency    7X/week      PT Plan Current plan remains appropriate    Co-evaluation              AM-PAC PT "6 Clicks" Mobility   Outcome Measure  Help needed turning from your back to your side while in a flat bed without using bedrails?: A Little Help needed moving from lying on your back to sitting on the side of a flat bed without using bedrails?: A Little Help needed moving to and from a bed to a chair (including a wheelchair)?: A Little Help needed standing up from a chair using your arms (e.g., wheelchair or bedside chair)?: A Little Help needed to walk in hospital room?:  A Little Help needed climbing 3-5 steps with a railing? : A Little 6 Click Score: 18    End of Session   Activity Tolerance: Patient tolerated treatment well Patient left: in chair;with call bell/phone within reach Nurse Communication: Mobility status PT Visit Diagnosis: Unsteadiness on feet (R26.81);Pain;Difficulty in walking, not elsewhere classified (R26.2) Pain - Right/Left: Left Pain - part of body: Hip     Time: 8592-9244 PT Time Calculation (min) (ACUTE ONLY): 23 min  Charges:  $Therapeutic Exercise: 23-37 mins                     Earney Navy, PTA Acute Rehabilitation Services Pager: [phone] Office: [phone]     Darliss Cheney 12/30/2017, 4:46 PM

## 2017-12-31 NOTE — Progress Notes (Signed)
Patient ID: Theresa Nolan, female   DOB: 08/22/1955, 62 y.o.   MRN: [id number] Doing well overall.  Can be discharged to home today.

## 2017-12-31 NOTE — Progress Notes (Signed)
Physical Therapy Treatment Patient Details Name: Theresa Nolan MRN: [id number] DOB: 1955/08/01 Today's Date: 12/31/2017    History of Present Illness Pt is a 62yo female s/p L direct anterior THA.    PT Comments    Patient seen for mobility progression. Pt tolerated gait and stair training well and able to complete standing LE strengthening exercises. Current plan remains appropriate.    Follow Up Recommendations  Home health PT;Supervision/Assistance - 24 hour     Equipment Recommendations  Rolling walker with 5" wheels;3in1 (PT)    Recommendations for Other Services       Precautions / Restrictions Precautions Precautions: Fall Restrictions Weight Bearing Restrictions: Yes LLE Weight Bearing: Weight bearing as tolerated    Mobility  Bed Mobility               General bed mobility comments: pt OOB in chair upon arrival  Transfers Overall transfer level: Needs assistance Equipment used: Rolling walker (2 wheeled) Transfers: Sit to/from Stand Sit to Stand: Supervision         General transfer comment: supervision for safety; pt demonstrates safe hand placement and technique  Ambulation/Gait Ambulation/Gait assistance: Min guard;Supervision Gait Distance (Feet): 100 Feet Assistive device: Rolling walker (2 wheeled) Gait Pattern/deviations: Step-through pattern;Decreased stride length;Decreased dorsiflexion - left;Decreased weight shift to left Gait velocity: decreased   General Gait Details: multimodal cues for increased L hip/knee flexion during swing phase; pt is improving with advancing L LE however does c/o heaviness and really has to focus on clearing L foot well with shoes donned   Stairs Stairs: Yes Stairs assistance: Min guard;Min assist Stair Management: One rail Left;Step to pattern;Sideways Number of Stairs: 6 General stair comments: cues for sequencing and technique    Wheelchair Mobility    Modified Rankin (Stroke Patients Only)        Balance Overall balance assessment: Needs assistance Sitting-balance support: Feet supported;No upper extremity supported Sitting balance-Leahy Scale: Good     Standing balance support: Bilateral upper extremity supported;During functional activity Standing balance-Leahy Scale: Poor                              Cognition Arousal/Alertness: Awake/alert Behavior During Therapy: WFL for tasks assessed/performed Overall Cognitive Status: Within Functional Limits for tasks assessed                                        Exercises Total Joint Exercises Hip ABduction/ADduction: Strengthening;Left;10 reps;Standing Knee Flexion: Strengthening;Left;10 reps;Standing Marching in Standing: Strengthening;Left;10 reps;Standing Standing Hip Extension: Strengthening;Left;10 reps;Standing    General Comments        Pertinent Vitals/Pain Pain Assessment: Faces Faces Pain Scale: Hurts little more Pain Location: L hip Pain Descriptors / Indicators: Guarding;Sore;Heaviness Pain Intervention(s): Limited activity within patient's tolerance;Monitored during session;Repositioned    Home Living                      Prior Function            PT Goals (current goals can now be found in the care plan section) Acute Rehab PT Goals Patient Stated Goal: home Progress towards PT goals: Progressing toward goals    Frequency    7X/week      PT Plan Current plan remains appropriate    Co-evaluation  AM-PAC PT "6 Clicks" Mobility   Outcome Measure  Help needed turning from your back to your side while in a flat bed without using bedrails?: A Little Help needed moving from lying on your back to sitting on the side of a flat bed without using bedrails?: A Little Help needed moving to and from a bed to a chair (including a wheelchair)?: A Little Help needed standing up from a chair using your arms (e.g., wheelchair or bedside  chair)?: A Little Help needed to walk in hospital room?: A Little Help needed climbing 3-5 steps with a railing? : A Little 6 Click Score: 18    End of Session Equipment Utilized During Treatment: Gait belt Activity Tolerance: Patient tolerated treatment well Patient left: in chair;with call bell/phone within reach Nurse Communication: Mobility status PT Visit Diagnosis: Unsteadiness on feet (R26.81);Pain;Difficulty in walking, not elsewhere classified (R26.2) Pain - Right/Left: Left Pain - part of body: Hip     Time: 1038-1100 PT Time Calculation (min) (ACUTE ONLY): 22 min  Charges:  $Gait Training: 8-22 mins                     Earney Navy, PTA Acute Rehabilitation Services Pager: [phone] Office: [phone]     Darliss Cheney 12/31/2017, 1:17 PM

## 2017-12-31 NOTE — Plan of Care (Signed)
Problem: Education: Goal: Knowledge of General Education information will improve Description Including pain rating scale, medication(s)/side effects and non-pharmacologic comfort measures Outcome: Progressing   Problem: Health Behavior/Discharge Planning: Goal: Ability to manage health-related needs will improve Outcome: Progressing   Problem: Clinical Measurements: Goal: Respiratory complications will improve Outcome: Progressing   Problem: Activity: Goal: Risk for activity intolerance will decrease Outcome: Progressing   Problem: Nutrition: Goal: Adequate nutrition will be maintained Outcome: Progressing   Problem: Coping: Goal: Level of anxiety will decrease Outcome: Progressing   Problem: Elimination: Goal: Will not experience complications related to urinary retention Outcome: Progressing   Problem: Pain Managment: Goal: General experience of comfort will improve Outcome: Progressing   Problem: Safety: Goal: Ability to remain free from injury will improve Outcome: Progressing   Problem: Skin Integrity: Goal: Risk for impaired skin integrity will decrease Outcome: Progressing   

## 2017-12-31 NOTE — Progress Notes (Signed)
AVS given and reviewed with pt. Medications discussed. All questions answered to satisfaction. Equipment at bedside. Pt to be escorted off the unit via wheelchair by staff member. Will continue to monitor.

## 2017-12-31 NOTE — Discharge Summary (Signed)
Patient ID: Theresa Nolan MRN: [id number] DOB/AGE: [birth year] 62 y.o.  Admit date: 12/28/2017 Discharge date: 12/31/2017  Admission Diagnoses:  Principal Problem:   Unilateral primary osteoarthritis, left hip Active Problems:   Status post total replacement of left hip   Discharge Diagnoses:  Same  Past Medical History:  Diagnosis Date  . Chronic back pain   . Degenerative joint disease   . GERD (gastroesophageal reflux disease)   . Hypercholesteremia   . Hypertension   . Hypothyroidism   . Lacunar infarction ([place])   . Stroke ([place])   . TSH elevation     Surgeries: Procedure(s): LEFT TOTAL HIP ARTHROPLASTY ANTERIOR APPROACH on 12/28/2017   Consultants:   Discharged Condition: Improved  Hospital Course: Theresa Nolan is an 62 y.o. female who was admitted 12/28/2017 for operative treatment ofUnilateral primary osteoarthritis, left hip. Patient has severe unremitting pain that affects sleep, daily activities, and work/hobbies. After pre-op clearance the patient was taken to the operating room on 12/28/2017 and underwent  Procedure(s): LEFT TOTAL HIP ARTHROPLASTY ANTERIOR APPROACH.    Patient was given perioperative antibiotics:  Anti-infectives (From admission, onward)   Start     Dose/Rate Route Frequency Ordered Stop   12/28/17 1800  ceFAZolin (ANCEF) IVPB 1 g/50 mL premix     1 g 100 mL/hr over 30 Minutes Intravenous Every 6 hours 12/28/17 1615 12/29/17 0101   12/28/17 1100  ceFAZolin (ANCEF) 3 g in dextrose 5 % 50 mL IVPB     3 g 100 mL/hr over 30 Minutes Intravenous To [facility]Stay [facility] 12/27/17 1412 12/28/17 1145   12/28/17 0938  ceFAZolin (ANCEF) 2-4 GM/100ML-% IVPB    Note to Pharmacy:  Laurita Quint   : cabinet override      12/28/17 0938 12/28/17 2144       Patient was given sequential compression devices, early ambulation, and chemoprophylaxis to prevent DVT.  Patient benefited maximally from hospital stay and there were no complications.     Recent vital signs:  Patient Vitals for the past 24 hrs:  BP Temp Temp src Pulse Resp SpO2 Height Weight  12/31/17 0539 (!) 149/76 98.4 F (36.9 C) Oral 80 20 99 % - -  12/30/17 1957 115/62 98.6 F (37 C) Oral 77 20 99 % - -  12/30/17 1527 115/70 98.4 F (36.9 C) Oral 80 20 99 % - -  12/30/17 0900 (!) 102/58 98 F (36.7 C) Oral 79 - 98 % - -  12/30/17 0820 - - - - - - 5\' 6"  (1.676 m) 114.2 kg     Recent laboratory studies:  Recent Labs    12/29/17 0246  WBC 15.1*  HGB 11.0*  HCT 35.5*  PLT 332  NA 139  K 3.6  CL 104  CO2 25  BUN 17  CREATININE 1.07*  GLUCOSE 173*  CALCIUM 8.7*     Discharge Medications:   Allergies as of 12/31/2017      Reactions   Lisinopril Cough      Medication List    STOP taking these medications   acetaminophen 500 MG tablet Commonly known as:  TYLENOL   aspirin EC 81 MG tablet Replaced by:  aspirin 81 MG chewable tablet   traMADol 50 MG tablet Commonly known as:  ULTRAM     TAKE these medications   amLODipine-valsartan 10-160 MG tablet Commonly known as:  EXFORGE Take 1 tablet by mouth every evening.   aspirin 81 MG chewable tablet Chew 1 tablet (81 mg total)  by mouth 2 (two) times daily. Replaces:  aspirin EC 81 MG tablet   B-12 SL Place 1 tablet under the tongue as needed (anemia).   carvedilol 12.5 MG tablet Commonly known as:  COREG Take 12.5 mg by mouth 2 (two) times daily with a meal.   cholecalciferol 1000 units tablet Commonly known as:  VITAMIN D Take 2,000 Units by mouth daily.   co-enzyme Q-10 30 MG capsule Take 30 mg by mouth every evening.   ezetimibe 10 MG tablet Commonly known as:  ZETIA Take 10 mg by mouth daily.   Fish Oil 1000 MG Caps Take 2 capsules by mouth daily.   hydrochlorothiazide 25 MG tablet Commonly known as:  HYDRODIURIL Take 25 mg by mouth every evening.   levothyroxine 25 MCG tablet Commonly known as:  SYNTHROID, LEVOTHROID Take 25 mcg by mouth daily.   methocarbamol  500 MG tablet Commonly known as:  ROBAXIN Take 1 tablet (500 mg total) by mouth every 6 (six) hours as needed for muscle spasms.   MUSCLE RUB 10-15 % Crea Apply 1 application topically as needed for muscle pain.   naproxen sodium 220 MG tablet Commonly known as:  ALEVE Take 220-440 mg by mouth 2 (two) times daily as needed (pain).   omeprazole 20 MG tablet Commonly known as:  PRILOSEC OTC Take 20 mg by mouth 2 (two) times daily.   oxyCODONE 5 MG immediate release tablet Commonly known as:  Oxy IR/ROXICODONE Take 1-2 tablets (5-10 mg total) by mouth every 3 (three) hours as needed for moderate pain (pain score 4-6).   simvastatin 40 MG tablet Commonly known as:  ZOCOR Take 40 mg by mouth daily.   TURMERIC PO Take 2 tablets by mouth every evening.            Durable Medical Equipment  (From admission, onward)         Start     Ordered   12/28/17 1616  DME Walker rolling  Once    Question:  Patient needs a walker to treat with the following condition  Answer:  Status post total replacement of left hip   12/28/17 1615   12/28/17 1616  DME 3 n 1  Once     12/28/17 1615          Diagnostic Studies: Dg Pelvis Portable  Result Date: 12/28/2017 CLINICAL DATA:  Post left hip replacement, some left hip pain EXAM: PORTABLE PELVIS 1-2 VIEWS COMPARISON:  Postop images of 12/28/2016 FINDINGS: The acetabular and femoral components of the left hip replacement appear to be in good position. No complicating features are seen. Mild degenerative change in the right hip is noted. The pelvic rami are intact. IMPRESSION: Left hip replacement components in good position. No complicating features. Electronically Signed   By: Ivar Drape M.D.   On: 12/28/2017 14:29   Dg C-arm 1-60 Min  Result Date: 12/28/2017 CLINICAL DATA:  Left hip arthroplasty. EXAM: OPERATIVE LEFT HIP (WITH PELVIS IF PERFORMED) 2 VIEWS TECHNIQUE: Fluoroscopic spot image(s) were submitted for interpretation  post-operatively. COMPARISON:  11/16/2017. FINDINGS: Total left hip replacement. Hardware intact. Anatomic alignment. Two views obtained. 0 minutes 30 seconds fluoroscopy time utilized. IMPRESSION: Total left hip replacement with anatomic alignment. Electronically Signed   By: Marcello Moores  Register   On: 12/28/2017 12:59   Dg Hip Operative Unilat W Or W/o Pelvis Left  Result Date: 12/28/2017 CLINICAL DATA:  Left hip arthroplasty. EXAM: OPERATIVE LEFT HIP (WITH PELVIS IF PERFORMED) 2 VIEWS TECHNIQUE: Fluoroscopic  spot image(s) were submitted for interpretation post-operatively. COMPARISON:  11/16/2017. FINDINGS: Total left hip replacement. Hardware intact. Anatomic alignment. Two views obtained. 0 minutes 30 seconds fluoroscopy time utilized. IMPRESSION: Total left hip replacement with anatomic alignment. Electronically Signed   By: Marcello Moores  Register   On: 12/28/2017 12:59    Disposition: Discharge disposition: 01-Home or [place], [facility] Follow up.   Specialty:  [place] Why:  A representative from [facility] will contact you to arrange start date and time for your therapy. Contact information: [address] [place] [address] [phone]        Mcarthur Rossetti, MD Follow up in 2 week(s).   Specialty:  Orthopedic Surgery Contact information: [place] [address] [phone]            Signed: Mcarthur Rossetti 12/31/2017, 6:45 AM

## 2018-01-03 DIAGNOSIS — Z96642 Presence of left artificial hip joint: Secondary | ICD-10-CM | POA: Diagnosis not present

## 2018-01-03 DIAGNOSIS — Z8673 Personal history of transient ischemic attack (TIA), and cerebral infarction without residual deficits: Secondary | ICD-10-CM | POA: Diagnosis not present

## 2018-01-03 DIAGNOSIS — Z87891 Personal history of nicotine dependence: Secondary | ICD-10-CM | POA: Diagnosis not present

## 2018-01-03 DIAGNOSIS — Z471 Aftercare following joint replacement surgery: Secondary | ICD-10-CM | POA: Diagnosis not present

## 2018-01-03 DIAGNOSIS — Z9181 History of falling: Secondary | ICD-10-CM | POA: Diagnosis not present

## 2018-01-03 DIAGNOSIS — I10 Essential (primary) hypertension: Secondary | ICD-10-CM | POA: Diagnosis not present

## 2018-01-03 DIAGNOSIS — E782 Mixed hyperlipidemia: Secondary | ICD-10-CM | POA: Diagnosis not present

## 2018-01-03 DIAGNOSIS — E039 Hypothyroidism, unspecified: Secondary | ICD-10-CM | POA: Diagnosis not present

## 2018-01-03 DIAGNOSIS — E669 Obesity, unspecified: Secondary | ICD-10-CM | POA: Diagnosis not present

## 2018-01-04 ENCOUNTER — Other Ambulatory Visit: Payer: Self-pay | Admitting: *Deleted

## 2018-01-04 ENCOUNTER — Telehealth (INDEPENDENT_AMBULATORY_CARE_PROVIDER_SITE_OTHER): Payer: Self-pay | Admitting: Orthopaedic Surgery

## 2018-01-04 NOTE — Telephone Encounter (Signed)
[facility]/PT/[facility] called for PT verbal orders  3x weeks for 3 weeks

## 2018-01-04 NOTE — Patient Outreach (Signed)
[facility]) Care Management  01/04/2018  RETINA Theresa Nolan 24-Sep-1955 [id number]   Transition of care telephone call:  Initial unsuccessful telephone call to patient's home number in order to complete transition of care assessment; no answer, left HIPAA compliant voicemail message requesting return call.  [name] was hospitalized at [facility] from 12/10-12/13/19 for left total hip replacement. Other comorbidities include:  HTN, and hyperlipidemia.  This RNCM will attempt another outreach within 4 business days and will route unsuccessful outreach letter to [place] Management clinical pool to be mailed to patient's home address.  Barrington Ellison RN,CCM,CDE [place] Management Coordinator Office Phone [phone] Office Fax [phone]

## 2018-01-04 NOTE — Telephone Encounter (Signed)
Verbal order given  

## 2018-01-07 ENCOUNTER — Other Ambulatory Visit: Payer: Self-pay | Admitting: *Deleted

## 2018-01-07 ENCOUNTER — Ambulatory Visit: Payer: Self-pay | Admitting: *Deleted

## 2018-01-07 NOTE — Patient Outreach (Signed)
[facility]) Care Management  01/07/2018  Theresa Nolan 1955-12-06 [id number]   Transition of care call Initial outreach attempt: 01/04/18  Second unsuccessful telephone call to patient's home number in order to complete transition of care assessment; no answer, left HIPAA compliant voicemail requesting return call.  [name] was hospitalized at [facility] from 12/10-12/13/19 for left total hip replacement  related to osteoarthritis. Comorbidities include: HTN, and hyperlipidemia.  RNCM will attempt a third and final outreach within 4 business days, if no return call from patient, will close case in 10 business days after initial outreach.   Barrington Ellison RN,CCM,CDE [place] Management Coordinator Office Phone [phone]

## 2018-01-10 ENCOUNTER — Encounter (INDEPENDENT_AMBULATORY_CARE_PROVIDER_SITE_OTHER): Payer: Self-pay | Admitting: Orthopaedic Surgery

## 2018-01-10 ENCOUNTER — Ambulatory Visit (INDEPENDENT_AMBULATORY_CARE_PROVIDER_SITE_OTHER): Payer: 59 | Admitting: Orthopaedic Surgery

## 2018-01-10 DIAGNOSIS — Z96642 Presence of left artificial hip joint: Secondary | ICD-10-CM

## 2018-01-10 NOTE — Progress Notes (Signed)
The patient is 2 weeks status post a left total hip arthroplasty through direct anterior approach.  She is doing well overall.  She does work as a Marine scientist.  She is 62 years old.  She is ambulating with a walker.  She has been through home health therapy.  We have had her on a baby aspirin twice a day.  She was on a baby aspirin daily before this.  She can go back to it once a day.  She has no significant complaints other than some numbness around her incision.  On exam the left hip incision looks good.  I was able to remove all the sutures in place Steri-Strips.  Her leg lengths are near equal.  At this point she will slowly increase her activities as comfort allows.  I do not feel comfortable with her returning to work next week as she recovers from the surgery and needs to work on balance and coordination.  She still using a walker as well.  I gave her a note to tentatively return to work starting February 19, 2018.  That may change depending on her follow-up with [place] in 4 weeks.  All question concerns were answered and addressed.

## 2018-01-13 ENCOUNTER — Ambulatory Visit: Payer: Self-pay | Admitting: *Deleted

## 2018-01-18 ENCOUNTER — Other Ambulatory Visit: Payer: Self-pay | Admitting: *Deleted

## 2018-01-18 ENCOUNTER — Ambulatory Visit: Payer: Self-pay | Admitting: *Deleted

## 2018-01-18 NOTE — Patient Outreach (Signed)
[facility]) Care Management  01/18/2018  Theresa Nolan 12/11/1955 [id number]   Transition of care call Initial outreach attempt: 01/04/18  Third unsuccessful telephone call to patient'shomenumber in order to complete transition of care assessment; not able to leave message as voice mail box was full. [name] total hip replacement  related to osteoarthritis. Comorbidities include: HTN, and hyperlipidemia. Surgeon's office visit note of 01/10/18 states she is doing well, has received home health physical therapy and is ambulating with a walker. She has a follow up appointment with the surgeon on 01/25/18. Will close case to [place] Management services as unable to reach patient and it has been 10 business days since initial outreach.  Barrington Ellison RN,CCM,CDE [place] Management Coordinator Office Phone [phone]

## 2018-01-24 MED FILL — OMEPRAZOLE 20 MG CPDR: 20 | 90 days supply | Qty: 180 | Fill #0

## 2018-01-24 MED FILL — LOSARTAN POTASSIUM 100 MG T: 100 | 30 days supply | Qty: 30 | Fill #2

## 2018-01-25 ENCOUNTER — Encounter (INDEPENDENT_AMBULATORY_CARE_PROVIDER_SITE_OTHER): Payer: Self-pay | Admitting: Orthopaedic Surgery

## 2018-01-25 ENCOUNTER — Ambulatory Visit (INDEPENDENT_AMBULATORY_CARE_PROVIDER_SITE_OTHER): Payer: 59 | Admitting: Orthopaedic Surgery

## 2018-01-25 ENCOUNTER — Ambulatory Visit (INDEPENDENT_AMBULATORY_CARE_PROVIDER_SITE_OTHER): Payer: 59

## 2018-01-25 DIAGNOSIS — Z96642 Presence of left artificial hip joint: Secondary | ICD-10-CM

## 2018-01-25 NOTE — Progress Notes (Signed)
Office Visit Note   Patient: Theresa Nolan           Date of Birth: 1956/01/09           MRN: [id number] Visit Date: 01/25/2018              Requested by: Kelton Pillar, MD [address] [place], [place] [address] PCP: Kelton Pillar, MD   Assessment & Plan: Visit Diagnoses:  1. Status post left hip replacement     Plan: Left hip prepped with Betadine after large easiness is in 115 cc of serosanguineous aspirates obtained patient tolerates well.  Hip feels much improved with ambulation after this.  She will follow-up with [place] in approximately 2 weeks that she has a follow-up appointment sooner if there is any questions or concerns.  Reassurance was given to the patient and her daughters present today.  Follow-Up Instructions: Return for Has appointment .   Orders:  Orders Placed This Encounter  Procedures  . XR HIP UNILAT W OR W/O PELVIS 1V LEFT   No orders of the defined types were placed in this encounter.     Procedures: No procedures performed   Clinical Data: No additional findings.   Subjective: Chief Complaint  Patient presents with  . Left Hip - Pain    HPI Ms. Fedorchak returns today status post left total hip arthroplasty 12/28/2017.  She was doing well until about a week ago she was in bed removed had sharp pain in the hip.  She like this checked out today.  She notes decreased flexion of the left hip and pain about the left hip.  She had no acute injury per se.  She had no radicular symptoms down the left leg.  Had no calf pain shortness of breath fevers chills. Review of Systems See HPI  Objective: Vital Signs: There were no vitals taken for this visit.  Physical Exam Constitutional:      Appearance: She is not ill-appearing or diaphoretic.  Pulmonary:     Effort: Pulmonary effort is normal.  Neurological:     Mental Status: She is alert and oriented to person, place, and time.     Ortho Exam Left hip good range of motion  without pain.  She has some tenderness over the left greater trochanteric region.  Positive seroma.  No abnormal warmth erythema surgical incisions healed well without any signs of infection.  Left calf supple nontender.  She is able to flex leg left hip but has some difficulty with this.  Dorsiflexion plantarflexion the ankle on the left is intact. Specialty Comments:  No specialty comments available.  Imaging: Xr Hip Unilat W Or W/o Pelvis 1v Left  Result Date: 01/25/2018 Low AP pelvis and lateral view of the left hip: No acute fracture.  Well-seated arthroplasty components without any evidence of failure.  Left hip is well located.    PMFS History: Patient Active Problem List   Diagnosis Date Noted  . Unilateral primary osteoarthritis, left hip 12/28/2017  . Status post total replacement of left hip 12/28/2017  . Cholecystitis 05/23/2014  . Acute cholecystitis   . HYPERLIPIDEMIA-MIXED 07/17/2009  . HYPERTENSION, BENIGN 07/17/2009  . SYNCOPE AND COLLAPSE 07/17/2009  . EDEMA 07/17/2009   Past Medical History:  Diagnosis Date  . Chronic back pain   . Degenerative joint disease   . GERD (gastroesophageal reflux disease)   . Hypercholesteremia   . Hypertension   . Hypothyroidism   . Lacunar infarction ([place])   .  Stroke ([place])   . TSH elevation     No family history on file.  Past Surgical History:  Procedure Laterality Date  . ABDOMINAL HYSTERECTOMY     ovaries remain   . APPENDECTOMY  1989-1990  . 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 NL LVF. EF 60%.  . CHOLECYSTECTOMY N/A 05/24/2014   Procedure: LAPAROSCOPIC CHOLECYSTECTOMY WITH INTRAOPERATIVE CHOLANGIOGRAM;  Surgeon: Coralie Keens, MD;  Location: [facility];  Service: General;  Laterality: N/A;  . HERNIA REPAIR     umbilical hernia repair as a child  . JOINT REPLACEMENT    . TONSILLECTOMY    . TOTAL HIP ARTHROPLASTY Left 12/28/2017  . TOTAL HIP ARTHROPLASTY Left 12/28/2017    Procedure: LEFT TOTAL HIP ARTHROPLASTY ANTERIOR APPROACH;  Surgeon: Mcarthur Rossetti, MD;  Location: [facility];  Service: Orthopedics;  Laterality: Left;   Social History   Occupational History  . Not on file  Tobacco Use  . Smoking status: Former Research scientist (life sciences)  . Smokeless tobacco: Never Used  . Tobacco comment: quit over 30 years ago  Substance and Sexual Activity  . Alcohol use: Yes    Alcohol/week: 1.0 standard drinks    Types: 1 Glasses of wine per week    Comment: occasionally   . Drug use: No  . Sexual activity: Not on file

## 2018-02-01 MED FILL — AMLODIPINE BESYLATE 10 MG T: 10 | 90 days supply | Qty: 90 | Fill #2

## 2018-02-07 ENCOUNTER — Encounter (INDEPENDENT_AMBULATORY_CARE_PROVIDER_SITE_OTHER): Payer: Self-pay | Admitting: Orthopaedic Surgery

## 2018-02-07 ENCOUNTER — Ambulatory Visit (INDEPENDENT_AMBULATORY_CARE_PROVIDER_SITE_OTHER): Payer: 59 | Admitting: Orthopaedic Surgery

## 2018-02-07 DIAGNOSIS — Z96642 Presence of left artificial hip joint: Secondary | ICD-10-CM

## 2018-02-07 NOTE — Progress Notes (Signed)
The patient is 6 weeks status post a left total hip arthroplasty.  She says she is doing well overall.  She says her range of motion and strength is slowly increasing.  She is walking without an assistive device.  She still gets occasional swelling around her hip incision she states but it does go down with warm compress.  On examination her incision looks good today.  Her right hip and left hip moves fluidly.  The left hip which is the operative site seems to be doing well.  Her leg lengths are equal.  This point should continue to increase her activities as comfort allows.  We will see her back in 6 months with a standing low AP pelvis and a lateral of her left operative hip.

## 2018-02-23 ENCOUNTER — Telehealth (INDEPENDENT_AMBULATORY_CARE_PROVIDER_SITE_OTHER): Payer: Self-pay | Admitting: Orthopaedic Surgery

## 2018-02-23 MED FILL — LOSARTAN POTASSIUM 100 MG T: 100 | 30 days supply | Qty: 30 | Fill #3

## 2018-02-23 MED FILL — LEVOTHYROXINE 25 MCG TABLET: 25 | 90 days supply | Qty: 90 | Fill #0

## 2018-02-23 MED FILL — traMADol HCL 50 MG TABS: 50 | 30 days supply | Qty: 90 | Fill #0

## 2018-02-23 NOTE — Telephone Encounter (Signed)
Please advise 

## 2018-02-23 NOTE — Telephone Encounter (Signed)
Patient called in need of a return to work note, I asked patient if she needed any specific restrictions she stated whatever Dr.Blackman recommends.

## 2018-02-23 NOTE — Telephone Encounter (Signed)
She can return to work without any significant restrictions other than no lifting greater than 20 pounds for the first 4 to 6 weeks.

## 2018-02-24 ENCOUNTER — Encounter (INDEPENDENT_AMBULATORY_CARE_PROVIDER_SITE_OTHER): Payer: Self-pay

## 2018-02-24 NOTE — Telephone Encounter (Signed)
LMOM for patient that her note was at the front desk s

## 2018-03-08 MED FILL — SIMVASTATIN 40 MG TABLET: 40 | 90 days supply | Qty: 90 | Fill #2

## 2018-03-08 MED FILL — CARVEDILOL 12.5 MG TABLET: 12.5 | 90 days supply | Qty: 180 | Fill #2

## 2018-04-01 MED FILL — EZETIMIBE 10 MG TABS: 10 | 90 days supply | Qty: 90 | Fill #1

## 2018-04-01 MED FILL — LOSARTAN POTASSIUM 100 MG T: 100 | 30 days supply | Qty: 30 | Fill #4 | Status: TO

## 2018-04-01 MED FILL — HYDROCHLOROTHIAZIDE 25 MG T: 25 | 90 days supply | Qty: 90 | Fill #2

## 2018-04-19 MED FILL — traMADol HCL 50 MG TABS: 50 | 30 days supply | Qty: 90 | Fill #0

## 2018-05-09 MED FILL — LOSARTAN POTASSIUM 100 MG T: 100 | 30 days supply | Qty: 30 | Fill #0 | Status: TO

## 2018-05-20 MED FILL — OMEPRAZOLE 20 MG CPDR: 20 | 90 days supply | Qty: 180 | Fill #0

## 2018-05-23 MED FILL — AMLODIPINE BESYLATE 10 MG T: 10 | 90 days supply | Qty: 90 | Fill #0

## 2018-05-31 MED FILL — LEVOTHYROXINE 25 MCG TABLET: 25 | 90 days supply | Qty: 90 | Fill #0

## 2018-06-15 MED FILL — SIMVASTATIN 40 MG TABLET: 40 | 90 days supply | Qty: 90 | Fill #3

## 2018-06-15 MED FILL — CARVEDILOL 12.5 MG TABLET: 12.5 | 90 days supply | Qty: 180 | Fill #3

## 2018-06-15 MED FILL — LOSARTAN POTASSIUM 100 MG T: 100 | 30 days supply | Qty: 30 | Fill #0

## 2018-06-21 MED FILL — CYCLOBENZAPRINE 10 MG TAB: 10 | 30 days supply | Qty: 30 | Fill #1

## 2018-06-21 MED FILL — traMADol HCL 50 MG TABS: 50 | 30 days supply | Qty: 90 | Fill #0

## 2018-07-07 DIAGNOSIS — Z0000 Encounter for general adult medical examination without abnormal findings: Secondary | ICD-10-CM | POA: Diagnosis not present

## 2018-07-07 DIAGNOSIS — K219 Gastro-esophageal reflux disease without esophagitis: Secondary | ICD-10-CM | POA: Diagnosis not present

## 2018-07-07 DIAGNOSIS — E039 Hypothyroidism, unspecified: Secondary | ICD-10-CM | POA: Diagnosis not present

## 2018-07-07 DIAGNOSIS — Z800 Family history of malignant neoplasm of digestive organs: Secondary | ICD-10-CM | POA: Diagnosis not present

## 2018-07-07 DIAGNOSIS — I10 Essential (primary) hypertension: Secondary | ICD-10-CM | POA: Diagnosis not present

## 2018-07-07 DIAGNOSIS — M5137 Other intervertebral disc degeneration, lumbosacral region: Secondary | ICD-10-CM | POA: Diagnosis not present

## 2018-07-07 DIAGNOSIS — E7800 Pure hypercholesterolemia, unspecified: Secondary | ICD-10-CM | POA: Diagnosis not present

## 2018-07-19 MED FILL — LOSARTAN POTASSIUM 100 MG T: 100 | 30 days supply | Qty: 30 | Fill #1

## 2018-07-19 MED FILL — HYDROCHLOROTHIAZIDE 25 MG T: 25 | 90 days supply | Qty: 90 | Fill #3

## 2018-07-19 MED FILL — EZETIMIBE 10 MG TABS: 10 | 90 days supply | Qty: 90 | Fill #2

## 2018-07-26 ENCOUNTER — Other Ambulatory Visit: Payer: Self-pay | Admitting: Family Medicine

## 2018-07-26 DIAGNOSIS — Z1231 Encounter for screening mammogram for malignant neoplasm of breast: Secondary | ICD-10-CM

## 2018-08-09 ENCOUNTER — Ambulatory Visit: Payer: Self-pay | Admitting: Orthopaedic Surgery

## 2018-08-17 MED FILL — LOSARTAN POTASSIUM 100 MG T: 100 | 30 days supply | Qty: 30 | Fill #2

## 2018-08-18 MED FILL — traMADol HCL 50 MG TABS: 50 | 30 days supply | Qty: 90 | Fill #0

## 2018-08-18 MED FILL — OMEPRAZOLE 20 MG CAPSULE DR: 20 | 90 days supply | Qty: 180 | Fill #0

## 2018-09-06 MED FILL — AMLODIPINE BESYLATE 10 MG T: 10 | 90 days supply | Qty: 90 | Fill #0

## 2018-09-06 MED FILL — LEVOTHYROXINE 25 MCG TABLET: 25 | 90 days supply | Qty: 90 | Fill #0

## 2018-09-08 ENCOUNTER — Ambulatory Visit: Payer: 59

## 2018-09-27 MED FILL — CARVEDILOL 12.5 MG TABLET: 12.5 | 90 days supply | Qty: 180 | Fill #0

## 2018-09-27 MED FILL — LOSARTAN POTASSIUM 100 MG T: 100 | 30 days supply | Qty: 30 | Fill #3

## 2018-09-27 MED FILL — SIMVASTATIN 40 MG TABLET: 40 | 90 days supply | Qty: 90 | Fill #0

## 2018-10-06 MED FILL — traMADol HCL 50 MG TABS: 50 | 30 days supply | Qty: 90 | Fill #0

## 2018-10-06 MED FILL — CYCLOBENZAPRINE 10 MG TAB: 10 | 30 days supply | Qty: 30 | Fill #0

## 2018-10-27 ENCOUNTER — Ambulatory Visit: Payer: 59

## 2018-10-28 MED FILL — LOSARTAN POTASSIUM 100 MG T: 100 | 30 days supply | Qty: 30 | Fill #4

## 2018-10-28 MED FILL — HYDROCHLOROTHIAZIDE 25 MG T: 25 | 90 days supply | Qty: 90 | Fill #0

## 2018-10-28 MED FILL — EZETIMIBE 10 MG TABS: 10 | 90 days supply | Qty: 90 | Fill #3

## 2018-11-24 ENCOUNTER — Other Ambulatory Visit: Payer: Self-pay

## 2018-11-24 ENCOUNTER — Ambulatory Visit
Admission: RE | Admit: 2018-11-24 | Discharge: 2018-11-24 | Disposition: A | Discharge status: Home / Self Care | Payer: 59 | Source: Ambulatory Visit | Attending: Family Medicine | Admitting: Family Medicine

## 2018-11-24 DIAGNOSIS — Z1231 Encounter for screening mammogram for malignant neoplasm of breast: Secondary | ICD-10-CM | POA: Diagnosis not present

## 2018-11-28 MED FILL — LOSARTAN POTASSIUM 100 MG T: 100 | 90 days supply | Qty: 90 | Fill #0

## 2018-11-28 MED FILL — CYCLOBENZAPRINE 10 MG TAB: 10 | 30 days supply | Qty: 30 | Fill #1

## 2018-11-28 MED FILL — traMADol HCL 50 MG TABS: 50 | 30 days supply | Qty: 90 | Fill #0

## 2018-11-29 DIAGNOSIS — K625 Hemorrhage of anus and rectum: Secondary | ICD-10-CM | POA: Diagnosis not present

## 2018-11-29 DIAGNOSIS — Z86010 Personal history of colonic polyps: Secondary | ICD-10-CM | POA: Diagnosis not present

## 2018-11-29 DIAGNOSIS — K5900 Constipation, unspecified: Secondary | ICD-10-CM | POA: Diagnosis not present

## 2018-11-29 DIAGNOSIS — K5730 Diverticulosis of large intestine without perforation or abscess without bleeding: Secondary | ICD-10-CM | POA: Diagnosis not present

## 2018-11-29 DIAGNOSIS — Z7689 Persons encountering health services in other specified circumstances: Secondary | ICD-10-CM | POA: Diagnosis not present

## 2018-12-05 MED FILL — OMEPRAZOLE 20 MG CAPSULE DR: 20 | 90 days supply | Qty: 180 | Fill #1

## 2018-12-05 MED FILL — LEVOTHYROXINE 25 MCG TABLET: 25 | 90 days supply | Qty: 90 | Fill #0

## 2018-12-13 DIAGNOSIS — Z86010 Personal history of colonic polyps: Secondary | ICD-10-CM | POA: Diagnosis not present

## 2018-12-13 DIAGNOSIS — K5730 Diverticulosis of large intestine without perforation or abscess without bleeding: Secondary | ICD-10-CM | POA: Diagnosis not present

## 2018-12-13 DIAGNOSIS — K219 Gastro-esophageal reflux disease without esophagitis: Secondary | ICD-10-CM | POA: Diagnosis not present

## 2018-12-19 MED FILL — AMLODIPINE BESYLATE 10 MG T: 10 | 90 days supply | Qty: 90 | Fill #0

## 2018-12-31 MED FILL — SIMVASTATIN 40 MG TABLET: 40 | 90 days supply | Qty: 90 | Fill #1

## 2018-12-31 MED FILL — CARVEDILOL 12.5 MG TABLET: 12.5 | 90 days supply | Qty: 180 | Fill #1

## 2019-01-05 DIAGNOSIS — I10 Essential (primary) hypertension: Secondary | ICD-10-CM | POA: Diagnosis not present

## 2019-01-05 DIAGNOSIS — E669 Obesity, unspecified: Secondary | ICD-10-CM | POA: Diagnosis not present

## 2019-01-05 DIAGNOSIS — E039 Hypothyroidism, unspecified: Secondary | ICD-10-CM | POA: Diagnosis not present

## 2019-01-05 DIAGNOSIS — E7800 Pure hypercholesterolemia, unspecified: Secondary | ICD-10-CM | POA: Diagnosis not present

## 2019-01-31 MED FILL — HYDROCHLOROTHIAZIDE 25 MG T: 25 | 90 days supply | Qty: 90 | Fill #1

## 2019-02-01 MED FILL — EZETIMIBE 10 MG TABS: 10 | 90 days supply | Qty: 90 | Fill #0

## 2019-02-01 MED FILL — traMADol HCL 50 MG TABS: 50 | 30 days supply | Qty: 90 | Fill #0

## 2019-03-06 MED FILL — LOSARTAN POTASSIUM 100 MG T: 100 | 90 days supply | Qty: 90 | Fill #0

## 2019-03-21 MED FILL — OMEPRAZOLE DR 20 MG CAPSULE: 20 | 90 days supply | Qty: 180 | Fill #2

## 2019-03-22 MED FILL — LEVOTHYROXINE SODIUM 25 MCG: 25 | 90 days supply | Qty: 90 | Fill #1

## 2019-03-22 MED FILL — AMLODIPINE BESYLATE 10 MG T: 10 | 90 days supply | Qty: 90 | Fill #1

## 2019-04-03 MED FILL — traMADol HCL 50 MG TABS: 50 | 30 days supply | Qty: 90 | Fill #0

## 2019-04-13 MED FILL — CARVEDILOL 12.5 MG TABLET: 12.5 | 90 days supply | Qty: 180 | Fill #0

## 2019-04-13 MED FILL — SIMVASTATIN 40 MG TABLET: 40 | 90 days supply | Qty: 90 | Fill #0

## 2019-05-15 MED FILL — EZETIMIBE 10 MG TABS: 10 | 90 days supply | Qty: 90 | Fill #1

## 2019-05-22 MED FILL — HYDROCHLOROTHIAZIDE 25 MG T: 25 | 90 days supply | Qty: 90 | Fill #2

## 2019-05-24 DIAGNOSIS — Z20828 Contact with and (suspected) exposure to other viral communicable diseases: Secondary | ICD-10-CM | POA: Diagnosis not present

## 2019-05-24 DIAGNOSIS — Z03818 Encounter for observation for suspected exposure to other biological agents ruled out: Secondary | ICD-10-CM | POA: Diagnosis not present

## 2019-06-07 MED FILL — traMADol HCL 50 MG TABS: 50 | 30 days supply | Qty: 90 | Fill #0

## 2019-06-12 MED FILL — LOSARTAN POTASSIUM 100 MG T: 100 | 90 days supply | Qty: 90 | Fill #1

## 2019-06-24 MED FILL — LEVOTHYROXINE SODIUM 25 MCG: 25 | 90 days supply | Qty: 90 | Fill #0

## 2019-06-24 MED FILL — CYCLOBENZAPRINE HCL 10 MG T: 10 | 30 days supply | Qty: 30 | Fill #2

## 2019-06-26 ENCOUNTER — Other Ambulatory Visit (HOSPITAL_COMMUNITY): Payer: Self-pay | Admitting: Family Medicine

## 2019-07-12 DIAGNOSIS — D126 Benign neoplasm of colon, unspecified: Secondary | ICD-10-CM | POA: Diagnosis not present

## 2019-07-12 DIAGNOSIS — E7800 Pure hypercholesterolemia, unspecified: Secondary | ICD-10-CM | POA: Diagnosis not present

## 2019-07-12 DIAGNOSIS — E039 Hypothyroidism, unspecified: Secondary | ICD-10-CM | POA: Diagnosis not present

## 2019-07-12 DIAGNOSIS — K219 Gastro-esophageal reflux disease without esophagitis: Secondary | ICD-10-CM | POA: Diagnosis not present

## 2019-07-12 DIAGNOSIS — Z0000 Encounter for general adult medical examination without abnormal findings: Secondary | ICD-10-CM | POA: Diagnosis not present

## 2019-07-12 DIAGNOSIS — I10 Essential (primary) hypertension: Secondary | ICD-10-CM | POA: Diagnosis not present

## 2019-07-12 DIAGNOSIS — M5137 Other intervertebral disc degeneration, lumbosacral region: Secondary | ICD-10-CM | POA: Diagnosis not present

## 2019-07-12 DIAGNOSIS — E669 Obesity, unspecified: Secondary | ICD-10-CM | POA: Diagnosis not present

## 2019-07-20 ENCOUNTER — Other Ambulatory Visit (HOSPITAL_COMMUNITY): Payer: Self-pay | Admitting: Family Medicine

## 2019-07-20 MED FILL — CARVEDILOL 12.5 MG TABLET: 12.5 | 90 days supply | Qty: 180 | Fill #0

## 2019-08-04 ENCOUNTER — Other Ambulatory Visit (HOSPITAL_COMMUNITY): Payer: Self-pay | Admitting: Family Medicine

## 2019-08-04 MED FILL — SIMVASTATIN 40 MG TABLET: 40 | 90 days supply | Qty: 90 | Fill #0

## 2019-08-04 MED FILL — traMADol HCL 50 MG TABS: 50 | 30 days supply | Qty: 90 | Fill #0

## 2019-08-23 ENCOUNTER — Other Ambulatory Visit (HOSPITAL_COMMUNITY): Payer: Self-pay | Admitting: Family Medicine

## 2019-08-23 MED FILL — EZETIMIBE 10 MG TABS: 10 | 90 days supply | Qty: 90 | Fill #0

## 2019-08-28 ENCOUNTER — Other Ambulatory Visit (HOSPITAL_COMMUNITY): Payer: Self-pay | Admitting: Family Medicine

## 2019-08-28 MED FILL — HYDROCHLOROTHIAZIDE 25 MG T: 25 | 90 days supply | Qty: 90 | Fill #0

## 2019-09-28 MED FILL — LOSARTAN POTASSIUM 100 MG T: 100 | 90 days supply | Qty: 90 | Fill #2

## 2019-10-03 ENCOUNTER — Other Ambulatory Visit (HOSPITAL_COMMUNITY): Payer: Self-pay | Admitting: Family Medicine

## 2019-10-03 MED FILL — OMEPRAZOLE DR 20 MG CAPSULE: 20 | 90 days supply | Qty: 180 | Fill #1

## 2019-10-03 MED FILL — LEVOTHYROXINE SODIUM 25 MCG: 25 | 90 days supply | Qty: 90 | Fill #1

## 2019-10-03 MED FILL — CYCLOBENZAPRINE HCL 10 MG T: 10 | 30 days supply | Qty: 30 | Fill #0

## 2019-10-05 MED FILL — FLUARIX QUADRIVALENT 0.5 ML: 0.5 | 1 days supply | Qty: 1 | Fill #0

## 2019-10-09 MED FILL — AMLODIPINE BESYLATE 10 MG T: 10 | 90 days supply | Qty: 90 | Fill #1

## 2019-10-10 MED FILL — traMADol HCL 50 MG TABS: 50 | 30 days supply | Qty: 90 | Fill #0

## 2019-10-26 MED FILL — CARVEDILOL 12.5 MG TABLET: 12.5 | 90 days supply | Qty: 180 | Fill #1

## 2019-11-24 MED FILL — SIMVASTATIN 40 MG TABLET: 40 | 90 days supply | Qty: 90 | Fill #1

## 2019-11-24 MED FILL — HYDROCHLOROTHIAZIDE 25 MG T: 25 | 90 days supply | Qty: 90 | Fill #1

## 2019-12-04 MED FILL — CYCLOBENZAPRINE HCL 10 MG T: 10 | 30 days supply | Qty: 30 | Fill #1

## 2019-12-05 ENCOUNTER — Other Ambulatory Visit (HOSPITAL_COMMUNITY): Payer: Self-pay | Admitting: Family Medicine

## 2019-12-05 MED FILL — traMADol HCL 50 MG TABS: 50 | 30 days supply | Qty: 90 | Fill #0

## 2019-12-27 MED FILL — EZETIMIBE 10 MG TABS: 10 | 90 days supply | Qty: 90 | Fill #1

## 2020-01-01 DIAGNOSIS — I10 Essential (primary) hypertension: Secondary | ICD-10-CM | POA: Diagnosis not present

## 2020-01-01 DIAGNOSIS — E039 Hypothyroidism, unspecified: Secondary | ICD-10-CM | POA: Diagnosis not present

## 2020-01-01 DIAGNOSIS — E7800 Pure hypercholesterolemia, unspecified: Secondary | ICD-10-CM | POA: Diagnosis not present

## 2020-01-02 ENCOUNTER — Other Ambulatory Visit: Payer: Self-pay | Admitting: Family Medicine

## 2020-01-02 DIAGNOSIS — Z0000 Encounter for general adult medical examination without abnormal findings: Secondary | ICD-10-CM

## 2020-01-09 MED FILL — LOSARTAN POTASSIUM 100 MG T: 100 | 90 days supply | Qty: 90 | Fill #3

## 2020-01-22 MED FILL — OMEPRAZOLE DR 20 MG CAPSULE: 20 | 90 days supply | Qty: 180 | Fill #2

## 2020-01-29 ENCOUNTER — Other Ambulatory Visit (HOSPITAL_COMMUNITY): Payer: Self-pay | Admitting: Family Medicine

## 2020-01-29 MED FILL — LEVOTHYROXINE SODIUM 25 MCG: 25 | 90 days supply | Qty: 90 | Fill #0

## 2020-01-30 MED FILL — AMLODIPINE BESYLATE 10 MG T: 10 | 90 days supply | Qty: 90 | Fill #2

## 2020-02-01 MED FILL — traMADol HCL 50 MG TABS: 50 | 30 days supply | Qty: 90 | Fill #0

## 2020-02-13 ENCOUNTER — Ambulatory Visit
Admission: RE | Admit: 2020-02-13 | Discharge: 2020-02-13 | Disposition: A | Discharge status: Home / Self Care | Payer: 59 | Source: Ambulatory Visit | Attending: Family Medicine | Admitting: Family Medicine

## 2020-02-13 ENCOUNTER — Other Ambulatory Visit: Payer: Self-pay

## 2020-02-13 DIAGNOSIS — Z0000 Encounter for general adult medical examination without abnormal findings: Secondary | ICD-10-CM

## 2020-02-13 DIAGNOSIS — Z1231 Encounter for screening mammogram for malignant neoplasm of breast: Secondary | ICD-10-CM | POA: Diagnosis not present

## 2020-02-15 MED FILL — CARVEDILOL 12.5 MG TABLET: 12.5 | 90 days supply | Qty: 180 | Fill #2

## 2020-03-16 MED FILL — SIMVASTATIN 40 MG TABLET: 40 | 90 days supply | Qty: 90 | Fill #2

## 2020-03-25 ENCOUNTER — Other Ambulatory Visit (HOSPITAL_COMMUNITY): Payer: Self-pay | Admitting: Family Medicine

## 2020-03-25 MED FILL — traMADol HCL 50 MG TABS: 50 | 30 days supply | Qty: 90 | Fill #0

## 2020-04-08 MED FILL — HYDROCHLOROTHIAZIDE 25 MG T: 25 | 90 days supply | Qty: 90 | Fill #2

## 2020-04-09 MED FILL — traMADol HCL 50 MG TABS: 50 | 30 days supply | Qty: 90 | Fill #0

## 2020-04-17 ENCOUNTER — Other Ambulatory Visit (HOSPITAL_COMMUNITY): Payer: Self-pay | Admitting: Family Medicine

## 2020-04-17 MED FILL — EZETIMIBE 10 MG TABS: 10 | 90 days supply | Qty: 90 | Fill #0

## 2020-04-17 MED FILL — LOSARTAN POTASSIUM 100 MG T: 100 | 30 days supply | Qty: 30 | Fill #0

## 2020-05-13 ENCOUNTER — Other Ambulatory Visit (HOSPITAL_COMMUNITY): Payer: Self-pay

## 2020-05-14 ENCOUNTER — Other Ambulatory Visit (HOSPITAL_COMMUNITY): Payer: Self-pay

## 2020-05-14 MED ORDER — OMEPRAZOLE 20 MG PO CPDR
20.0000 mg | DELAYED_RELEASE_CAPSULE | Freq: Two times a day (BID) | ORAL | 1 refills | Status: DC
  Filled 2020-05-14: qty 180, 90d supply, fill #0
  Filled 2020-09-02: qty 180, 90d supply, fill #1

## 2020-05-18 ENCOUNTER — Other Ambulatory Visit (HOSPITAL_COMMUNITY): Payer: Self-pay

## 2020-05-18 MED FILL — Levothyroxine Sodium Tab 25 MCG: ORAL | 90 days supply | Qty: 90 | Fill #0 | Status: AC

## 2020-05-20 ENCOUNTER — Other Ambulatory Visit (HOSPITAL_COMMUNITY): Payer: Self-pay

## 2020-05-20 MED ORDER — AMLODIPINE BESYLATE 10 MG PO TABS
10.0000 mg | ORAL_TABLET | Freq: Every day | ORAL | 0 refills | Status: DC
  Filled 2020-05-20: qty 90, 90d supply, fill #0

## 2020-05-21 ENCOUNTER — Other Ambulatory Visit (HOSPITAL_COMMUNITY): Payer: Self-pay

## 2020-06-01 ENCOUNTER — Other Ambulatory Visit (HOSPITAL_COMMUNITY): Payer: Self-pay

## 2020-06-01 MED FILL — Cyclobenzaprine HCl Tab 10 MG: ORAL | 30 days supply | Qty: 30 | Fill #0 | Status: AC

## 2020-06-03 ENCOUNTER — Other Ambulatory Visit (HOSPITAL_COMMUNITY): Payer: Self-pay

## 2020-06-04 ENCOUNTER — Other Ambulatory Visit (HOSPITAL_COMMUNITY): Payer: Self-pay

## 2020-06-04 MED ORDER — TRAMADOL HCL 50 MG PO TABS
50.0000 mg | ORAL_TABLET | Freq: Three times a day (TID) | ORAL | 0 refills | Status: DC | PRN
  Filled 2020-06-04: qty 90, 30d supply, fill #0

## 2020-06-04 MED FILL — Carvedilol Tab 12.5 MG: ORAL | 90 days supply | Qty: 180 | Fill #0 | Status: AC

## 2020-07-03 ENCOUNTER — Other Ambulatory Visit (HOSPITAL_COMMUNITY): Payer: Self-pay

## 2020-07-03 MED FILL — Losartan Potassium Tab 100 MG: ORAL | 90 days supply | Qty: 90 | Fill #0 | Status: AC

## 2020-07-05 ENCOUNTER — Other Ambulatory Visit (HOSPITAL_COMMUNITY): Payer: Self-pay

## 2020-07-05 MED FILL — Simvastatin Tab 40 MG: ORAL | 90 days supply | Qty: 90 | Fill #0 | Status: AC

## 2020-07-29 ENCOUNTER — Other Ambulatory Visit (HOSPITAL_COMMUNITY): Payer: Self-pay

## 2020-07-29 DIAGNOSIS — Z0000 Encounter for general adult medical examination without abnormal findings: Secondary | ICD-10-CM | POA: Diagnosis not present

## 2020-07-29 DIAGNOSIS — E7800 Pure hypercholesterolemia, unspecified: Secondary | ICD-10-CM | POA: Diagnosis not present

## 2020-07-29 DIAGNOSIS — M5137 Other intervertebral disc degeneration, lumbosacral region: Secondary | ICD-10-CM | POA: Diagnosis not present

## 2020-07-29 DIAGNOSIS — E669 Obesity, unspecified: Secondary | ICD-10-CM | POA: Diagnosis not present

## 2020-07-29 DIAGNOSIS — K219 Gastro-esophageal reflux disease without esophagitis: Secondary | ICD-10-CM | POA: Diagnosis not present

## 2020-07-29 DIAGNOSIS — D126 Benign neoplasm of colon, unspecified: Secondary | ICD-10-CM | POA: Diagnosis not present

## 2020-07-29 DIAGNOSIS — E039 Hypothyroidism, unspecified: Secondary | ICD-10-CM | POA: Diagnosis not present

## 2020-07-29 DIAGNOSIS — I10 Essential (primary) hypertension: Secondary | ICD-10-CM | POA: Diagnosis not present

## 2020-07-29 MED ORDER — TRAMADOL HCL 50 MG PO TABS
50.0000 mg | ORAL_TABLET | Freq: Three times a day (TID) | ORAL | 0 refills | Status: DC | PRN
  Filled 2020-07-29: qty 90, 30d supply, fill #0

## 2020-07-29 MED ORDER — PREDNISONE 10 MG (21) PO TBPK
ORAL_TABLET | ORAL | 0 refills | Status: DC
  Filled 2020-07-29: qty 21, 6d supply, fill #0

## 2020-08-01 ENCOUNTER — Other Ambulatory Visit (HOSPITAL_COMMUNITY): Payer: Self-pay

## 2020-08-01 MED ORDER — HYDROCHLOROTHIAZIDE 25 MG PO TABS
25.0000 mg | ORAL_TABLET | Freq: Every morning | ORAL | 3 refills | Status: DC
  Filled 2020-08-01: qty 90, 90d supply, fill #0
  Filled 2020-11-16: qty 90, 90d supply, fill #1
  Filled 2021-03-04: qty 90, 90d supply, fill #2
  Filled 2021-06-16: qty 90, 90d supply, fill #3

## 2020-08-01 MED FILL — Ezetimibe Tab 10 MG: ORAL | 90 days supply | Qty: 90 | Fill #0 | Status: AC

## 2020-08-07 ENCOUNTER — Ambulatory Visit (INDEPENDENT_AMBULATORY_CARE_PROVIDER_SITE_OTHER): Payer: 59

## 2020-08-07 ENCOUNTER — Ambulatory Visit: Payer: 59 | Admitting: Orthopaedic Surgery

## 2020-08-07 ENCOUNTER — Encounter: Payer: Self-pay | Admitting: Orthopaedic Surgery

## 2020-08-07 ENCOUNTER — Other Ambulatory Visit (HOSPITAL_COMMUNITY): Payer: Self-pay

## 2020-08-07 VITALS — Ht 66.0 in | Wt 238.0 lb

## 2020-08-07 DIAGNOSIS — M545 Low back pain, unspecified: Secondary | ICD-10-CM

## 2020-08-07 DIAGNOSIS — M25552 Pain in left hip: Secondary | ICD-10-CM

## 2020-08-07 DIAGNOSIS — Z96642 Presence of left artificial hip joint: Secondary | ICD-10-CM

## 2020-08-07 MED ORDER — METHOCARBAMOL 750 MG PO TABS
750.0000 mg | ORAL_TABLET | Freq: Three times a day (TID) | ORAL | 1 refills | Status: DC | PRN
  Filled 2020-08-07: qty 60, 20d supply, fill #0
  Filled 2020-11-04: qty 60, 20d supply, fill #1

## 2020-08-07 NOTE — Progress Notes (Signed)
Office Visit Note   Patient: Theresa Nolan           Date of Birth: Jan 12, 1956           MRN: [id number] Visit Date: 08/07/2020              Requested by: Kelton Pillar, MD [address] [place],  [place] [address] PCP: Kelton Pillar, MD   Assessment & Plan: Visit Diagnoses:  1. Pain in left hip   2. Acute left-sided low back pain, unspecified whether sciatica present   3. History of total left hip replacement     Plan: This seems to be more of a muscle strain from lifting heavy patient and there could have been irritation to the nerve.  Right now she is feeling much better overall so I recommended just activity modification and no lifting greater than 25 pounds for the next month.  We will switch her to Robaxin from Flexeril per her request and I agree with this as well and she will take oral anti-inflammatories.  If things worsen she will let us know.  We can see her back in 4 weeks to make sure she is doing well.  All questions and concerns were answered and addressed.  Follow-Up Instructions: Return in about 4 weeks (around 09/04/2020).   Orders:  Orders Placed This Encounter  Procedures   XR HIP UNILAT W OR W/O PELVIS 2-3 VIEWS LEFT   XR Lumbar Spine 2-3 Views   Meds ordered this encounter  Medications   methocarbamol (ROBAXIN) 750 MG tablet    Sig: Take 1 tablet (750 mg total) by mouth every 8 (eight) hours as needed for muscle spasms.    Dispense:  60 tablet    Refill:  1      Procedures: No procedures performed   Clinical Data: No additional findings.   Subjective: Chief Complaint  Patient presents with   Lower Back - Pain   Left Hip - Pain  The patient is someone that I replaced her left hip in 2019.  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  She was put on a steroid by her primary care physician and she said that that helped her quite a bit.   She still having an odd feeling around the hip itself and pain with certain movements but she brought to the low back as a source of her pain.  She denies any radicular symptoms going down into her leg or foot.  She denies any groin pain.  HPI  Review of Systems There is currently listed no headache, chest pain, shortness of breath, fever, chills, nausea, vomiting  Objective: Vital Signs: Ht 5\' 6"  (1.676 m)   Wt 238 lb (108 kg)   BMI 38.41 kg/m   Physical Exam She is alert and oriented x3 and in no acute distress Ortho Exam Examination of her left operative hip shows it moves smoothly and fluidly with no pain at all in the groin and no pain when stressing the hip.  All of her pain seems to be low back and sciatic related to the left side.  Her motor and sensory exam on the right lower extremity and left lower extremity is normal and she has negative straight leg raise. Specialty Comments:  No specialty comments available.  Imaging: XR HIP UNILAT W OR W/O PELVIS 2-3 VIEWS  LEFT  Result Date: 08/07/2020 An AP pelvis and lateral of the left hip shows a well-seated total hip arthroplasty with no complicating features.  XR Lumbar Spine 2-3 Views  Result Date: 08/07/2020 2 views of the lumbar spine shows a some mild to moderate degenerative changes of the lower lumbar spine.    PMFS History: Patient Active Problem List   Diagnosis Date Noted   Unilateral primary osteoarthritis, left hip 12/28/2017   Status post total replacement of left hip 12/28/2017   Cholecystitis 05/23/2014   Acute cholecystitis    HYPERLIPIDEMIA-MIXED 07/17/2009   HYPERTENSION, BENIGN 07/17/2009   SYNCOPE AND COLLAPSE 07/17/2009   EDEMA 07/17/2009   Past Medical History:  Diagnosis Date   Chronic back pain    Degenerative joint disease    GERD (gastroesophageal reflux disease)    Hypercholesteremia    Hypertension    Hypothyroidism    Lacunar infarction ([place])    Stroke ([facility])    TSH elevation     No  family history on file.  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 NL LVF. EF 60%.   CHOLECYSTECTOMY N/A 05/24/2014   Procedure: LAPAROSCOPIC CHOLECYSTECTOMY WITH INTRAOPERATIVE CHOLANGIOGRAM;  Surgeon: Coralie Keens, MD;  Location: [facility];  Service: General;  Laterality: N/A;   HERNIA REPAIR     umbilical hernia repair as a child   JOINT REPLACEMENT     TONSILLECTOMY     TOTAL HIP ARTHROPLASTY Left 12/28/2017   TOTAL HIP ARTHROPLASTY Left 12/28/2017   Procedure: LEFT TOTAL HIP ARTHROPLASTY ANTERIOR APPROACH;  Surgeon: Mcarthur Rossetti, MD;  Location: [facility];  Service: Orthopedics;  Laterality: Left;   Social History   Occupational History   Not on file  Tobacco Use   Smoking status: Former   Smokeless tobacco: Never   Tobacco comments:    quit over 30 years ago  Vaping Use   Vaping Use: Never used  Substance and Sexual Activity   Alcohol use: Yes    Alcohol/week: 1.0 standard drink    Types: 1 Glasses of wine per week    Comment: occasionally    Drug use: No   Sexual activity: Not on file

## 2020-08-30 ENCOUNTER — Other Ambulatory Visit (HOSPITAL_COMMUNITY): Payer: Self-pay

## 2020-08-30 MED ORDER — AMLODIPINE BESYLATE 10 MG PO TABS
10.0000 mg | ORAL_TABLET | Freq: Every day | ORAL | 3 refills | Status: DC
  Filled 2020-08-30: qty 73, 73d supply, fill #0
  Filled 2020-08-30: qty 17, 17d supply, fill #0
  Filled 2020-12-27: qty 90, 90d supply, fill #1
  Filled 2021-04-11: qty 90, 90d supply, fill #2

## 2020-09-02 ENCOUNTER — Other Ambulatory Visit (HOSPITAL_COMMUNITY): Payer: Self-pay

## 2020-09-05 ENCOUNTER — Ambulatory Visit: Payer: 59 | Admitting: Orthopaedic Surgery

## 2020-09-17 ENCOUNTER — Other Ambulatory Visit (HOSPITAL_COMMUNITY): Payer: Self-pay

## 2020-09-17 MED ORDER — TRAMADOL HCL 50 MG PO TABS
50.0000 mg | ORAL_TABLET | Freq: Three times a day (TID) | ORAL | 0 refills | Status: DC | PRN
  Filled 2020-09-17: qty 90, 30d supply, fill #0

## 2020-09-17 MED FILL — Levothyroxine Sodium Tab 25 MCG: ORAL | 90 days supply | Qty: 90 | Fill #1 | Status: AC

## 2020-09-19 ENCOUNTER — Other Ambulatory Visit (HOSPITAL_COMMUNITY): Payer: Self-pay

## 2020-09-19 MED ORDER — EZETIMIBE 10 MG PO TABS
10.0000 mg | ORAL_TABLET | Freq: Every day | ORAL | 3 refills | Status: DC
  Filled 2020-09-19 – 2020-12-11 (×2): qty 90, 90d supply, fill #0
  Filled 2021-04-03: qty 90, 90d supply, fill #1
  Filled 2021-07-20: qty 90, 90d supply, fill #2

## 2020-09-19 MED ORDER — SIMVASTATIN 40 MG PO TABS
40.0000 mg | ORAL_TABLET | Freq: Every evening | ORAL | 3 refills | Status: DC
  Filled 2020-09-19: qty 90, 90d supply, fill #0

## 2020-09-19 MED ORDER — SIMVASTATIN 40 MG PO TABS
40.0000 mg | ORAL_TABLET | Freq: Every evening | ORAL | 3 refills | Status: DC
  Filled 2020-09-19: qty 90, 90d supply, fill #0
  Filled 2021-02-14: qty 90, 90d supply, fill #1
  Filled 2021-06-16: qty 90, 90d supply, fill #2

## 2020-09-19 MED ORDER — EZETIMIBE 10 MG PO TABS
10.0000 mg | ORAL_TABLET | Freq: Every day | ORAL | 3 refills | Status: DC
  Filled 2020-09-19: qty 90, 90d supply, fill #0

## 2020-09-19 MED ORDER — CARVEDILOL 12.5 MG PO TABS
12.5000 mg | ORAL_TABLET | Freq: Two times a day (BID) | ORAL | 3 refills | Status: AC
  Filled 2020-09-19: qty 180, 90d supply, fill #0
  Filled 2021-01-19: qty 180, 90d supply, fill #1
  Filled 2021-05-05: qty 180, 90d supply, fill #2
  Filled 2021-08-18: qty 180, 90d supply, fill #3

## 2020-09-19 MED ORDER — CARVEDILOL 12.5 MG PO TABS
12.5000 mg | ORAL_TABLET | Freq: Two times a day (BID) | ORAL | 3 refills | Status: DC
  Filled 2020-09-19: qty 180, 90d supply, fill #0

## 2020-09-25 ENCOUNTER — Other Ambulatory Visit (HOSPITAL_COMMUNITY): Payer: Self-pay

## 2020-09-25 MED FILL — Losartan Potassium Tab 100 MG: ORAL | 60 days supply | Qty: 60 | Fill #1 | Status: AC

## 2020-11-04 ENCOUNTER — Other Ambulatory Visit (HOSPITAL_COMMUNITY): Payer: Self-pay

## 2020-11-15 ENCOUNTER — Other Ambulatory Visit (HOSPITAL_COMMUNITY): Payer: Self-pay

## 2020-11-16 ENCOUNTER — Other Ambulatory Visit (HOSPITAL_COMMUNITY): Payer: Self-pay

## 2020-11-18 ENCOUNTER — Other Ambulatory Visit (HOSPITAL_COMMUNITY): Payer: Self-pay

## 2020-11-18 MED ORDER — TRAMADOL HCL 50 MG PO TABS
50.0000 mg | ORAL_TABLET | Freq: Three times a day (TID) | ORAL | 0 refills | Status: DC | PRN
  Filled 2020-11-18: qty 90, 30d supply, fill #0

## 2020-12-11 ENCOUNTER — Other Ambulatory Visit (HOSPITAL_COMMUNITY): Payer: Self-pay

## 2020-12-11 MED ORDER — OMEPRAZOLE 20 MG PO CPDR
20.0000 mg | DELAYED_RELEASE_CAPSULE | Freq: Two times a day (BID) | ORAL | 1 refills | Status: DC
  Filled 2020-12-11: qty 180, 90d supply, fill #0
  Filled 2021-04-03: qty 180, 90d supply, fill #1

## 2020-12-18 ENCOUNTER — Other Ambulatory Visit (HOSPITAL_COMMUNITY): Payer: Self-pay

## 2020-12-19 DIAGNOSIS — H5213 Myopia, bilateral: Secondary | ICD-10-CM | POA: Diagnosis not present

## 2020-12-19 DIAGNOSIS — H52223 Regular astigmatism, bilateral: Secondary | ICD-10-CM | POA: Diagnosis not present

## 2020-12-20 ENCOUNTER — Other Ambulatory Visit (HOSPITAL_COMMUNITY): Payer: Self-pay

## 2020-12-20 MED ORDER — LOSARTAN POTASSIUM 100 MG PO TABS
100.0000 mg | ORAL_TABLET | Freq: Every day | ORAL | 3 refills | Status: DC
  Filled 2020-12-20: qty 90, 90d supply, fill #0
  Filled 2021-03-25: qty 90, 90d supply, fill #1
  Filled 2021-07-20: qty 90, 90d supply, fill #2
  Filled 2021-11-01: qty 90, 90d supply, fill #3

## 2020-12-27 ENCOUNTER — Other Ambulatory Visit (HOSPITAL_COMMUNITY): Payer: Self-pay

## 2020-12-27 MED FILL — Levothyroxine Sodium Tab 25 MCG: ORAL | 90 days supply | Qty: 90 | Fill #2 | Status: AC

## 2020-12-30 ENCOUNTER — Other Ambulatory Visit (HOSPITAL_COMMUNITY): Payer: Self-pay

## 2021-01-08 ENCOUNTER — Other Ambulatory Visit (HOSPITAL_COMMUNITY): Payer: Self-pay

## 2021-01-09 ENCOUNTER — Other Ambulatory Visit (HOSPITAL_COMMUNITY): Payer: Self-pay

## 2021-01-09 MED ORDER — TRAMADOL HCL 50 MG PO TABS
50.0000 mg | ORAL_TABLET | Freq: Three times a day (TID) | ORAL | 0 refills | Status: DC | PRN
  Filled 2021-01-09 – 2021-01-19 (×2): qty 90, 30d supply, fill #0

## 2021-01-17 ENCOUNTER — Other Ambulatory Visit (HOSPITAL_COMMUNITY): Payer: Self-pay

## 2021-01-20 ENCOUNTER — Other Ambulatory Visit (HOSPITAL_COMMUNITY): Payer: Self-pay

## 2021-01-21 ENCOUNTER — Other Ambulatory Visit (HOSPITAL_COMMUNITY): Payer: Self-pay

## 2021-01-21 MED ORDER — CYCLOBENZAPRINE HCL 10 MG PO TABS
10.0000 mg | ORAL_TABLET | Freq: Every evening | ORAL | 1 refills | Status: DC | PRN
  Filled 2021-01-21: qty 90, 90d supply, fill #0
  Filled 2021-04-11: qty 90, 90d supply, fill #1

## 2021-01-29 ENCOUNTER — Other Ambulatory Visit (HOSPITAL_COMMUNITY): Payer: Self-pay

## 2021-02-14 ENCOUNTER — Other Ambulatory Visit (HOSPITAL_COMMUNITY): Payer: Self-pay

## 2021-02-27 ENCOUNTER — Other Ambulatory Visit: Payer: Self-pay | Admitting: Family Medicine

## 2021-02-27 DIAGNOSIS — Z1231 Encounter for screening mammogram for malignant neoplasm of breast: Secondary | ICD-10-CM

## 2021-03-04 ENCOUNTER — Ambulatory Visit: Payer: 59 | Admitting: Orthopaedic Surgery

## 2021-03-04 ENCOUNTER — Other Ambulatory Visit: Payer: Self-pay

## 2021-03-04 ENCOUNTER — Other Ambulatory Visit (HOSPITAL_COMMUNITY): Payer: Self-pay

## 2021-03-04 VITALS — Ht 66.0 in | Wt 238.0 lb

## 2021-03-04 DIAGNOSIS — M5441 Lumbago with sciatica, right side: Secondary | ICD-10-CM

## 2021-03-04 DIAGNOSIS — G8929 Other chronic pain: Secondary | ICD-10-CM

## 2021-03-04 DIAGNOSIS — M25551 Pain in right hip: Secondary | ICD-10-CM | POA: Diagnosis not present

## 2021-03-04 MED ORDER — PREDNISONE 50 MG PO TABS
50.0000 mg | ORAL_TABLET | Freq: Every day | ORAL | 0 refills | Status: DC
Start: 2021-03-04 — End: 2023-09-30
  Filled 2021-03-04: qty 5, 5d supply, fill #0

## 2021-03-04 NOTE — Progress Notes (Signed)
The patient is a 66 year old female that I last saw over 6 months ago with left hip pain and left-sided low back pain.  This was after lifting a heavy patient.  She was feeling better at that point in time and so we had her on restrictions of lifting no greater than 25 pounds.  We had wanted to see her back in 4 weeks.  However now she is just coming back saying that she has right hip pain and increasing back pain.  She was seen by one of my colleagues in town for her spine who is a Licensed conveyancer but she does not want to go back to that position.  She has not had any back surgery.  She says she is just not getting any better.  She denies any left-sided low back pain or left hip pain for which I saw her for last year.  An AP pelvis showed her right hip in July of last year and showed a normal joint space.  On exam her right hip moves smoothly and fluidly with no pain on rotation.  She does report some groin pain but most of her pain seems to be low back and sciatic region.  She also has a component of pain over the trochanteric area.  Of note her BMI is almost 38.41.  She has a positive straight leg raise as well on the right side.  Previous lumbar spine films show some significant degenerative changes throughout the lumbar spine.  At this point I do feel a MRI is warranted of the lumbar spine to rule out nerve compression given the fact that she had seen a spine specialist before who is recommending interventions.  I will put her on 5 days of prednisone at 50 mg to see if this will help while we wait on the MRI of her lumbar spine.  She is already on tramadol and Flexeril from her primary care physician.

## 2021-03-07 ENCOUNTER — Other Ambulatory Visit (HOSPITAL_COMMUNITY): Payer: Self-pay

## 2021-03-10 ENCOUNTER — Other Ambulatory Visit (HOSPITAL_COMMUNITY): Payer: Self-pay

## 2021-03-10 MED ORDER — TRAMADOL HCL 50 MG PO TABS
50.0000 mg | ORAL_TABLET | Freq: Three times a day (TID) | ORAL | 0 refills | Status: DC | PRN
  Filled 2021-03-10: qty 90, 30d supply, fill #0

## 2021-03-11 ENCOUNTER — Ambulatory Visit
Admission: RE | Admit: 2021-03-11 | Discharge: 2021-03-11 | Disposition: A | Discharge status: Home / Self Care | Payer: 59 | Source: Ambulatory Visit | Attending: Family Medicine | Admitting: Family Medicine

## 2021-03-11 DIAGNOSIS — Z1231 Encounter for screening mammogram for malignant neoplasm of breast: Secondary | ICD-10-CM

## 2021-03-16 ENCOUNTER — Other Ambulatory Visit: Payer: Self-pay

## 2021-03-16 ENCOUNTER — Ambulatory Visit
Admission: RE | Admit: 2021-03-16 | Discharge: 2021-03-16 | Disposition: A | Discharge status: Home / Self Care | Payer: 59 | Source: Ambulatory Visit | Attending: Orthopaedic Surgery | Admitting: Orthopaedic Surgery

## 2021-03-16 DIAGNOSIS — M5441 Lumbago with sciatica, right side: Secondary | ICD-10-CM

## 2021-03-16 DIAGNOSIS — M5127 Other intervertebral disc displacement, lumbosacral region: Secondary | ICD-10-CM | POA: Diagnosis not present

## 2021-03-16 DIAGNOSIS — G8929 Other chronic pain: Secondary | ICD-10-CM

## 2021-03-18 ENCOUNTER — Encounter: Payer: Self-pay | Admitting: Orthopaedic Surgery

## 2021-03-18 ENCOUNTER — Ambulatory Visit: Payer: 59 | Admitting: Orthopaedic Surgery

## 2021-03-18 DIAGNOSIS — M5441 Lumbago with sciatica, right side: Secondary | ICD-10-CM | POA: Diagnosis not present

## 2021-03-18 DIAGNOSIS — G8929 Other chronic pain: Secondary | ICD-10-CM | POA: Diagnosis not present

## 2021-03-18 NOTE — Progress Notes (Signed)
The patient comes in today to go over MRI of her lumbar spine.  She is having right-sided low back pain and radicular symptoms down her right leg.  She has seen a spine specialist in the past who recommended surgery.  She has deferred that.  I have not seen her in a long period time and she came recently with right sciatic pain and hip pain.  Her right hip exam is normal and her x-rays are normal of the hip.  She still having the same pain in terms of the sciatic pain that radiates down her right leg.  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  She does have a positive straight leg raise on my exam on the right side but good strength.  I did give her the MRI findings and she is not interested in surgical referral but is interested in at least seeing if an intervention such as an epidural steroid would help.  We will refer her to Dr. Ernestina Patches for a right-sided L4 ESI.

## 2021-03-24 ENCOUNTER — Telehealth: Payer: Self-pay | Admitting: Orthopaedic Surgery

## 2021-03-24 ENCOUNTER — Other Ambulatory Visit: Payer: Self-pay

## 2021-03-24 DIAGNOSIS — G8929 Other chronic pain: Secondary | ICD-10-CM

## 2021-03-24 NOTE — Telephone Encounter (Signed)
Patient called asked has she been referred to Dr. Ernestina Patches yet for her back? The number to contact patient is [phone]  ?

## 2021-03-25 ENCOUNTER — Other Ambulatory Visit (HOSPITAL_COMMUNITY): Payer: Self-pay

## 2021-03-31 ENCOUNTER — Telehealth: Payer: Self-pay | Admitting: Physical Medicine and Rehabilitation

## 2021-03-31 NOTE — Telephone Encounter (Signed)
Patient called. Returning a call to schedule with Dr. Newton.  ?

## 2021-04-03 ENCOUNTER — Other Ambulatory Visit (HOSPITAL_COMMUNITY): Payer: Self-pay

## 2021-04-11 ENCOUNTER — Other Ambulatory Visit (HOSPITAL_COMMUNITY): Payer: Self-pay

## 2021-04-14 ENCOUNTER — Other Ambulatory Visit (HOSPITAL_COMMUNITY): Payer: Self-pay

## 2021-04-15 ENCOUNTER — Other Ambulatory Visit (HOSPITAL_COMMUNITY): Payer: Self-pay

## 2021-04-16 ENCOUNTER — Other Ambulatory Visit (HOSPITAL_COMMUNITY): Payer: Self-pay

## 2021-04-18 ENCOUNTER — Other Ambulatory Visit (HOSPITAL_COMMUNITY): Payer: Self-pay

## 2021-04-18 MED ORDER — LEVOTHYROXINE SODIUM 25 MCG PO TABS
25.0000 ug | ORAL_TABLET | Freq: Every morning | ORAL | 4 refills | Status: DC
  Filled 2021-04-18: qty 90, 90d supply, fill #0
  Filled 2021-08-03: qty 90, 90d supply, fill #1
  Filled 2021-11-15: qty 90, 90d supply, fill #2
  Filled 2022-02-09: qty 90, 90d supply, fill #3

## 2021-04-21 ENCOUNTER — Ambulatory Visit: Payer: Self-pay

## 2021-04-21 ENCOUNTER — Encounter: Payer: Self-pay | Admitting: Physical Medicine and Rehabilitation

## 2021-04-21 ENCOUNTER — Ambulatory Visit (INDEPENDENT_AMBULATORY_CARE_PROVIDER_SITE_OTHER): Payer: 59 | Admitting: Physical Medicine and Rehabilitation

## 2021-04-21 VITALS — BP 187/124 | HR 85

## 2021-04-21 DIAGNOSIS — M5416 Radiculopathy, lumbar region: Secondary | ICD-10-CM | POA: Diagnosis not present

## 2021-04-21 MED ORDER — METHYLPREDNISOLONE ACETATE 80 MG/ML IJ SUSP
80.0000 mg | Freq: Once | INTRAMUSCULAR | Status: AC
  Administered 2021-04-21: 80 mg

## 2021-04-21 NOTE — Progress Notes (Signed)
Pt state lower back pain that travels to both hips. Pt state bending, sitting and walking makes the pain worse. Pt state she takes pain meds and uses heat to help ease her pain. ? ?Numeric Pain Rating Scale and Functional Assessment ?Average Pain 7 ? ? ?In the last MONTH (on 0-10 scale) has pain interfered with the following? ? ?1. General activity like being  able to carry out your everyday physical activities such as walking, climbing stairs, carrying groceries, or moving a chair?  ?Rating(9) ? ? ?+Driver, -BT, -Dye Allergies. ? ?

## 2021-04-21 NOTE — Patient Instructions (Signed)

## 2021-04-28 ENCOUNTER — Other Ambulatory Visit (HOSPITAL_COMMUNITY): Payer: Self-pay

## 2021-04-28 MED ORDER — TRAMADOL HCL 50 MG PO TABS
50.0000 mg | ORAL_TABLET | Freq: Three times a day (TID) | ORAL | 0 refills | Status: DC | PRN
  Filled 2021-04-28: qty 90, 30d supply, fill #0

## 2021-05-05 ENCOUNTER — Other Ambulatory Visit (HOSPITAL_COMMUNITY): Payer: Self-pay

## 2021-05-13 NOTE — Progress Notes (Signed)
? ?Theresa Nolan - 66 y.o. female MRN [id number]  Date of birth: May 09, 1955 ? ?Office Visit Note: ?Visit Date: 04/21/2021 ?PCP: Kelton Pillar, MD ?Referred by: Kelton Pillar, MD ? ?Subjective: ?Chief Complaint  ?Patient presents with  ? Lower Back - Pain  ? Right Hip - Pain  ? Left Hip - Pain  ? ?HPI:  Theresa Nolan is a 66 y.o. female who comes in today at the request of Dr. Jean Rosenthal for planned Right L5-S1 Lumbar Interlaminar epidural steroid injection with fluoroscopic guidance.  The patient has failed conservative care including home exercise, medications, time and activity modification.  This injection will be diagnostic and hopefully therapeutic.  Please see requesting physician notes for further details and justification. ? ?ROS Otherwise per HPI. ? ?Assessment & Plan: ?Visit Diagnoses:  ?  ICD-10-CM   ?1. Lumbar radiculopathy  M54.16 XR C-ARM NO REPORT  ?  Epidural Steroid injection  ?  methylPREDNISolone acetate (DEPO-MEDROL) injection 80 mg  ?  ?  ?Plan: No additional findings.  ? ?Meds & Orders:  ?Meds ordered this encounter  ?Medications  ? methylPREDNISolone acetate (DEPO-MEDROL) injection 80 mg  ?  ?Orders Placed This Encounter  ?Procedures  ? XR C-ARM NO REPORT  ? Epidural Steroid injection  ?  ?Follow-up: Return for visit to requesting provider as needed.  ? ?Procedures: ?No procedures performed  ?Lumbar Epidural Steroid Injection - Interlaminar Approach with Fluoroscopic Guidance ? ?Patient: Theresa Nolan      ?Date of Birth: 1955/06/27 ?MRN: [id number] ?PCP: Kelton Pillar, MD      ?Visit Date: 04/21/2021 ?  ?Universal Protocol:    ? ?Consent Given By: the patient ? ?Position: PRONE ? ?Additional Comments: ?Vital signs were monitored before and after the procedure. ?Patient was prepped and draped in the usual sterile fashion. ?The correct patient, procedure, and site was verified. ? ? ?Injection Procedure Details:  ? ?Procedure diagnoses: Lumbar radiculopathy [M54.16]  ? ?Meds  Administered:  ?Meds ordered this encounter  ?Medications  ? methylPREDNISolone acetate (DEPO-MEDROL) injection 80 mg  ?  ? ?Laterality: Right ? ?Location/Site:  L5-S1 ? ?Needle: 3.5 in., 20 ga. Tuohy ? ?Needle Placement: Paramedian epidural ? ?Findings:  ? -Comments: Excellent flow of contrast into the epidural space. ? ?Procedure Details: ?Using a paramedian approach from the side mentioned above, the region overlying the inferior lamina was localized under fluoroscopic visualization and the soft tissues overlying this structure were infiltrated with 4 ml. of 1% Lidocaine without Epinephrine. The Tuohy needle was inserted into the epidural space using a paramedian approach.  ? ?The epidural space was localized using loss of resistance along with counter oblique bi-planar fluoroscopic views.  After negative aspirate for air, blood, and CSF, a 2 ml. volume of Isovue-250 was injected into the epidural space and the flow of contrast was observed. Radiographs were obtained for documentation purposes.   ? ?The injectate was administered into the level noted above. ? ? ?Additional Comments:  ?The patient tolerated the procedure well ?Dressing: 2 x 2 sterile gauze and Band-Aid ?  ? ?Post-procedure details: ?Patient was observed during the procedure. ?Post-procedure instructions were reviewed. ? ?Patient left the clinic in stable condition.  ? ?Clinical History: ?MRI LUMBAR SPINE WITHOUT CONTRAST ?  ?TECHNIQUE: ?Multiplanar, multisequence MR imaging of the lumbar spine was ?performed. No intravenous contrast was administered. ?  ?COMPARISON:  Lumbar radiographs 08/07/2020. MRI 10/14/2012. ?  ?FINDINGS: ?Segmentation: Normal on the 2022 radiographs, the same numbering ?system used on the 2014 MRI. ?  ?  Alignment: Stable chronic lumbar lordosis. Mild dextroconvex lumbar ?scoliosis demonstrated on the radiographs last year is less apparent ?now. However, subtle anterolisthesis has developed at L4-L5 since ?2014. See additional  details of that level below. ?  ?Vertebrae: No marrow edema or evidence of acute osseous abnormality. ?Normal background bone marrow signal. Intact visible sacrum and SI ?joints. ?  ?Conus medullaris and cauda equina: Conus extends to the L1-L2 level. ?No lower spinal cord or conus signal abnormality. ?  ?Paraspinal and other soft tissues: Negative. ?  ?Disc levels: ?  ?T10-T11: Mild chronic ligament flavum hypertrophy without stenosis. ?  ?T11-T12: Minor disc bulging and facet hypertrophy. No stenosis. ?  ?T12-L1:  Negative. ?  ?L1-L2: Chronic left paracentral disc bulge or protrusion here mildly ?effaces the ventral CSF space (series 9, image 10), but there is no ?convincing spinal or foraminal stenosis. ?  ?L2-L3: Negative disc. Mild to moderate facet hypertrophy has ?increased since 2014. But there is no significant stenosis. ?  ?L3-L4: Mild circumferential disc bulge. Mild to moderate facet and ?ligament flavum hypertrophy. Tiny posteriorly situated synovial ?cysts which should not cause neural compromise. No spinal or lateral ?recess stenosis. Mild left and moderate right L3 foraminal stenosis ?have not significantly changed. ?  ?L4-L5: Subtle anterolisthesis. Increased circumferential disc bulge, ?and right foraminal component of disc (series 7, image 5). Increased ?moderate facet and ligament flavum hypertrophy, degenerative facet ?joint fluid. Small posteriorly situated synovial cysts bilaterally ?which should not cause neural compromise. No significant spinal or ?lateral recess stenosis. Mild to moderate left L4 foraminal stenosis ?is chronic and stable. But moderate right L4 foraminal stenosis is ?new since 2014. ?  ?L5-S1: Increased circumferential disc bulge, especially left ?foraminal disc and endplate spurring. Moderate to severe facet ?hypertrophy has progressed and is greater on the left (series 9, ?image 34) with bilateral degenerative facet joint fluid. Small ?left-side subchondral synovial cyst  but right side synovial cyst ?projecting toward the right neural foramina (series 9, image 33). No ?spinal or lateral recess stenosis. Increased and moderate left L4 ?foraminal stenosis. Mild right L5 foraminal stenosis appears stable ?aside from the small new synovial cyst. ?  ?IMPRESSION: ?1. Subtle anterolisthesis has developed at L4-L5 since 2014, with ?progressed facet disease there and especially at L5-S1. And ?increased foraminal disc at both levels. ?No spinal or lateral recess stenosis at either level, but progressed ?and moderate neural foraminal stenosis at the right L4 and the ?bilateral L5 nerve levels. ?  ?2. No significant lumbar spinal or lateral recess stenosis, and ?other lumbar levels have not significantly changed since 2014. ?  ?  ?Electronically Signed ?  By: Genevie Ann M.D. ?  On: 03/17/2021 10:07  ? ? ? ?Objective:  VS:  HT:    WT:   BMI:     BP:(!) 187/124  HR:85bpm  TEMP: ( )  RESP:  ?Physical Exam ?Vitals and nursing note reviewed.  ?Constitutional:   ?   General: She is not in acute distress. ?   Appearance: Normal appearance. She is not ill-appearing.  ?HENT:  ?   Head: Normocephalic and atraumatic.  ?   Right Ear: External ear normal.  ?   Left Ear: External ear normal.  ?Eyes:  ?   Extraocular Movements: Extraocular movements intact.  ?Cardiovascular:  ?   Rate and Rhythm: Normal rate.  ?   Pulses: Normal pulses.  ?Pulmonary:  ?   Effort: Pulmonary effort is normal. No respiratory distress.  ?Abdominal:  ?   General:  There is no distension.  ?   Palpations: Abdomen is soft.  ?Musculoskeletal:     ?   General: Tenderness present.  ?   Cervical back: Neck supple.  ?   Right lower leg: No edema.  ?   Left lower leg: No edema.  ?   Comments: Patient has good distal strength with no pain over the greater trochanters.  No clonus or focal weakness.  ?Skin: ?   Findings: No erythema, lesion or rash.  ?Neurological:  ?   General: No focal deficit present.  ?   Mental Status: She is alert and  oriented to person, place, and time.  ?   Sensory: No sensory deficit.  ?   Motor: No weakness or abnormal muscle tone.  ?   Coordination: Coordination normal.  ?Psychiatric:     ?   Mood and Affect: Mood

## 2021-05-13 NOTE — Procedures (Signed)
Lumbar Epidural Steroid Injection - Interlaminar Approach with Fluoroscopic Guidance ? ?Patient: Theresa Nolan      ?Date of Birth: Sep 05, 1955 ?MRN: [id number] ?PCP: Kelton Pillar, MD      ?Visit Date: 04/21/2021 ?  ?Universal Protocol:    ? ?Consent Given By: the patient ? ?Position: PRONE ? ?Additional Comments: ?Vital signs were monitored before and after the procedure. ?Patient was prepped and draped in the usual sterile fashion. ?The correct patient, procedure, and site was verified. ? ? ?Injection Procedure Details:  ? ?Procedure diagnoses: Lumbar radiculopathy [M54.16]  ? ?Meds Administered:  ?Meds ordered this encounter  ?Medications  ? methylPREDNISolone acetate (DEPO-MEDROL) injection 80 mg  ?  ? ?Laterality: Right ? ?Location/Site:  L5-S1 ? ?Needle: 3.5 in., 20 ga. Tuohy ? ?Needle Placement: Paramedian epidural ? ?Findings:  ? -Comments: Excellent flow of contrast into the epidural space. ? ?Procedure Details: ?Using a paramedian approach from the side mentioned above, the region overlying the inferior lamina was localized under fluoroscopic visualization and the soft tissues overlying this structure were infiltrated with 4 ml. of 1% Lidocaine without Epinephrine. The Tuohy needle was inserted into the epidural space using a paramedian approach.  ? ?The epidural space was localized using loss of resistance along with counter oblique bi-planar fluoroscopic views.  After negative aspirate for air, blood, and CSF, a 2 ml. volume of Isovue-250 was injected into the epidural space and the flow of contrast was observed. Radiographs were obtained for documentation purposes.   ? ?The injectate was administered into the level noted above. ? ? ?Additional Comments:  ?The patient tolerated the procedure well ?Dressing: 2 x 2 sterile gauze and Band-Aid ?  ? ?Post-procedure details: ?Patient was observed during the procedure. ?Post-procedure instructions were reviewed. ? ?Patient left the clinic in stable  condition. ?

## 2021-06-05 ENCOUNTER — Telehealth: Payer: Self-pay | Admitting: Physical Medicine and Rehabilitation

## 2021-06-05 NOTE — Telephone Encounter (Signed)
Pt called requesting a call back for an appt for spine injection. Please call pt at [phone]

## 2021-06-16 ENCOUNTER — Other Ambulatory Visit (HOSPITAL_COMMUNITY): Payer: Self-pay

## 2021-06-17 ENCOUNTER — Other Ambulatory Visit (HOSPITAL_COMMUNITY): Payer: Self-pay

## 2021-06-18 ENCOUNTER — Other Ambulatory Visit (HOSPITAL_COMMUNITY): Payer: Self-pay

## 2021-06-18 MED ORDER — TRAMADOL HCL 50 MG PO TABS
50.0000 mg | ORAL_TABLET | Freq: Three times a day (TID) | ORAL | 0 refills | Status: DC | PRN
  Filled 2021-06-18: qty 90, 30d supply, fill #0

## 2021-07-03 ENCOUNTER — Ambulatory Visit: Payer: Self-pay

## 2021-07-03 ENCOUNTER — Ambulatory Visit (INDEPENDENT_AMBULATORY_CARE_PROVIDER_SITE_OTHER): Payer: 59 | Admitting: Physical Medicine and Rehabilitation

## 2021-07-03 ENCOUNTER — Encounter: Payer: Self-pay | Admitting: Physical Medicine and Rehabilitation

## 2021-07-03 VITALS — BP 138/85 | HR 82

## 2021-07-03 DIAGNOSIS — M5441 Lumbago with sciatica, right side: Secondary | ICD-10-CM | POA: Diagnosis not present

## 2021-07-03 DIAGNOSIS — M47816 Spondylosis without myelopathy or radiculopathy, lumbar region: Secondary | ICD-10-CM | POA: Diagnosis not present

## 2021-07-03 DIAGNOSIS — G8929 Other chronic pain: Secondary | ICD-10-CM | POA: Diagnosis not present

## 2021-07-03 MED ORDER — METHYLPREDNISOLONE ACETATE 80 MG/ML IJ SUSP
80.0000 mg | Freq: Once | INTRAMUSCULAR | Status: AC
  Administered 2021-07-03: 80 mg

## 2021-07-03 NOTE — Patient Instructions (Signed)

## 2021-07-03 NOTE — Progress Notes (Signed)
Pt state lower back pain that travels to both hips. Pt state bending, sitting and walking makes the pain worse. Pt state she takes pain meds and uses heat to help ease her pain.  Numeric Pain Rating Scale and Functional Assessment Average Pain 7   In the last MONTH (on 0-10 scale) has pain interfered with the following?  1. General activity like being  able to carry out your everyday physical activities such as walking, climbing stairs, carrying groceries, or moving a chair?  Rating(10)   +Driver, -BT, -Dye Allergies.

## 2021-07-18 NOTE — Progress Notes (Signed)
Theresa Nolan - 66 y.o. female MRN [id number]  Date of birth: Jul 04, 1955  Office Visit Note: Visit Date: 07/03/2021 PCP: Kelton Pillar, MD Referred by: Kelton Pillar, MD  Subjective: Chief Complaint  Patient presents with   Lower Back - Pain   Right Hip - Pain   Left Hip - Pain   HPI:  Theresa Nolan is a 66 y.o. female who comes in today at the request of Dr. Jean Rosenthal for planned Right  L4-5 and L5-S1 Lumbar facet/medial branch block with fluoroscopic guidance.  The patient has failed conservative care including home exercise, medications, time and activity modification.  This injection will be diagnostic and hopefully therapeutic.  Please see requesting physician notes for further details and justification.  Exam has shown concordant pain with facet joint loading.   ROS Otherwise per HPI.  Assessment & Plan: Visit Diagnoses:    ICD-10-CM   1. Spondylosis without myelopathy or radiculopathy, lumbar region  M47.816 XR C-ARM NO REPORT    methylPREDNISolone acetate (DEPO-MEDROL) injection 80 mg    Facet Injection    2. Chronic right-sided low back pain with right-sided sciatica  M54.41 XR C-ARM NO REPORT   G89.29 methylPREDNISolone acetate (DEPO-MEDROL) injection 80 mg    Facet Injection      Plan: No additional findings.   Meds & Orders:  Meds ordered this encounter  Medications   methylPREDNISolone acetate (DEPO-MEDROL) injection 80 mg    Orders Placed This Encounter  Procedures   Facet Injection   XR C-ARM NO REPORT    Follow-up: Return if symptoms worsen or fail to improve.   Procedures: No procedures performed  Lumbar Facet Joint Intra-Articular Injection(s) with Fluoroscopic Guidance  Patient: Theresa Nolan      Date of Birth: Jan 19, 1956 MRN: [id number] PCP: Kelton Pillar, MD      Visit Date: 07/03/2021   Universal Protocol:    Date/Time: 07/03/2021  Consent Given By: the patient  Position: PRONE   Additional Comments: Vital  signs were monitored before and after the procedure. Patient was prepped and draped in the usual sterile fashion. The correct patient, procedure, and site was verified.   Injection Procedure Details:  Procedure Site One Meds Administered:  Meds ordered this encounter  Medications   methylPREDNISolone acetate (DEPO-MEDROL) injection 80 mg     Laterality: Right  Location/Site:  L4-L5 L5-S1  Needle size: 22 guage  Needle type: Spinal  Needle Placement: Articular  Findings:  -Comments: Excellent flow of contrast producing a partial arthrogram.  Procedure Details: The fluoroscope beam is vertically oriented in AP, and the inferior recess is visualized beneath the lower pole of the inferior apophyseal process, which represents the target point for needle insertion. When direct visualization is difficult the target point is located at the medial projection of the vertebral pedicle. The region overlying each aforementioned target is locally anesthetized with a 1 to 2 ml. volume of 1% Lidocaine without Epinephrine.   The spinal needle was inserted into each of the above mentioned facet joints using biplanar fluoroscopic guidance. A 0.25 to 0.5 ml. volume of Isovue-250 was injected and a partial facet joint arthrogram was obtained. A single spot film was obtained of the resulting arthrogram.    One to 1.25 ml of the steroid/anesthetic solution was then injected into each of the facet joints noted above.   Additional Comments:  The patient tolerated the procedure well Dressing: 2 x 2 sterile gauze and Band-Aid    Post-procedure details: Patient was  observed during the procedure. Post-procedure instructions were reviewed.  Patient left the clinic in stable condition.    Clinical History: MRI LUMBAR SPINE WITHOUT CONTRAST   TECHNIQUE: Multiplanar, multisequence MR imaging of the lumbar spine was performed. No intravenous contrast was administered.   COMPARISON:  Lumbar  radiographs 08/07/2020. MRI 10/14/2012.   FINDINGS: Segmentation: Normal on the 2022 radiographs, the same numbering system used on the 2014 MRI.   Alignment: Stable chronic lumbar lordosis. Mild dextroconvex lumbar scoliosis demonstrated on the radiographs last year is less apparent now. However, subtle anterolisthesis has developed at L4-L5 since 2014. See additional details of that level below.   Vertebrae: No marrow edema or evidence of acute osseous abnormality. Normal background bone marrow signal. Intact visible sacrum and SI joints.   Conus medullaris and cauda equina: Conus extends to the L1-L2 level. No lower spinal cord or conus signal abnormality.   Paraspinal and other soft tissues: Negative.   Disc levels:   T10-T11: Mild chronic ligament flavum hypertrophy without stenosis.   T11-T12: Minor disc bulging and facet hypertrophy. No stenosis.   T12-L1:  Negative.   L1-L2: Chronic left paracentral disc bulge or protrusion here mildly effaces the ventral CSF space (series 9, image 10), but there is no convincing spinal or foraminal stenosis.   L2-L3: Negative disc. Mild to moderate facet hypertrophy has increased since 2014. But there is no significant stenosis.   L3-L4: Mild circumferential disc bulge. Mild to moderate facet and ligament flavum hypertrophy. Tiny posteriorly situated synovial cysts which should not cause neural compromise. No spinal or lateral recess stenosis. Mild left and moderate right L3 foraminal stenosis have not significantly changed.   L4-L5: Subtle anterolisthesis. Increased circumferential disc bulge, and right foraminal component of disc (series 7, image 5). Increased moderate facet and ligament flavum hypertrophy, degenerative facet joint fluid. Small posteriorly situated synovial cysts bilaterally which should not cause neural compromise. No significant spinal or lateral recess stenosis. Mild to moderate left L4 foraminal  stenosis is chronic and stable. But moderate right L4 foraminal stenosis is new since 2014.   L5-S1: Increased circumferential disc bulge, especially left foraminal disc and endplate spurring. Moderate to severe facet hypertrophy has progressed and is greater on the left (series 9, image 34) with bilateral degenerative facet joint fluid. Small left-side subchondral synovial cyst but right side synovial cyst projecting toward the right neural foramina (series 9, image 33). No spinal or lateral recess stenosis. Increased and moderate left L4 foraminal stenosis. Mild right L5 foraminal stenosis appears stable aside from the small new synovial cyst.   IMPRESSION: 1. Subtle anterolisthesis has developed at L4-L5 since 2014, with progressed facet disease there and especially at L5-S1. And increased foraminal disc at both levels. No spinal or lateral recess stenosis at either level, but progressed and moderate neural foraminal stenosis at the right L4 and the bilateral L5 nerve levels.   2. No significant lumbar spinal or lateral recess stenosis, and other lumbar levels have not significantly changed since 2014.     Electronically Signed   By: Genevie Ann M.D.   On: 03/17/2021 10:07     Objective:  VS:  HT:    WT:   BMI:     BP:138/85  HR:82bpm  TEMP: ( )  RESP:  Physical Exam Vitals and nursing note reviewed.  Constitutional:      General: She is not in acute distress.    Appearance: Normal appearance. She is obese. She is not ill-appearing.  HENT:  Head: Normocephalic and atraumatic.     Right Ear: External ear normal.     Left Ear: External ear normal.  Eyes:     Extraocular Movements: Extraocular movements intact.  Cardiovascular:     Rate and Rhythm: Normal rate.     Pulses: Normal pulses.  Pulmonary:     Effort: Pulmonary effort is normal. No respiratory distress.  Abdominal:     General: There is no distension.     Palpations: Abdomen is soft.   Musculoskeletal:        General: Tenderness present.     Cervical back: Neck supple.     Right lower leg: No edema.     Left lower leg: No edema.     Comments: Patient has good distal strength with no pain over the greater trochanters.  No clonus or focal weakness.  Skin:    Findings: No erythema, lesion or rash.  Neurological:     General: No focal deficit present.     Mental Status: She is alert and oriented to person, place, and time.     Sensory: No sensory deficit.     Motor: No weakness or abnormal muscle tone.     Coordination: Coordination normal.  Psychiatric:        Mood and Affect: Mood normal.        Behavior: Behavior normal.      Imaging: No results found.

## 2021-07-18 NOTE — Procedures (Signed)
Lumbar Facet Joint Intra-Articular Injection(s) with Fluoroscopic Guidance  Patient: Theresa Nolan      Date of Birth: August 16, 1955 MRN: [id number] PCP: Kelton Pillar, MD      Visit Date: 07/03/2021   Universal Protocol:    Date/Time: 07/03/2021  Consent Given By: the patient  Position: PRONE   Additional Comments: Vital signs were monitored before and after the procedure. Patient was prepped and draped in the usual sterile fashion. The correct patient, procedure, and site was verified.   Injection Procedure Details:  Procedure Site One Meds Administered:  Meds ordered this encounter  Medications   methylPREDNISolone acetate (DEPO-MEDROL) injection 80 mg     Laterality: Right  Location/Site:  L4-L5 L5-S1  Needle size: 22 guage  Needle type: Spinal  Needle Placement: Articular  Findings:  -Comments: Excellent flow of contrast producing a partial arthrogram.  Procedure Details: The fluoroscope beam is vertically oriented in AP, and the inferior recess is visualized beneath the lower pole of the inferior apophyseal process, which represents the target point for needle insertion. When direct visualization is difficult the target point is located at the medial projection of the vertebral pedicle. The region overlying each aforementioned target is locally anesthetized with a 1 to 2 ml. volume of 1% Lidocaine without Epinephrine.   The spinal needle was inserted into each of the above mentioned facet joints using biplanar fluoroscopic guidance. A 0.25 to 0.5 ml. volume of Isovue-250 was injected and a partial facet joint arthrogram was obtained. A single spot film was obtained of the resulting arthrogram.    One to 1.25 ml of the steroid/anesthetic solution was then injected into each of the facet joints noted above.   Additional Comments:  The patient tolerated the procedure well Dressing: 2 x 2 sterile gauze and Band-Aid    Post-procedure details: Patient was  observed during the procedure. Post-procedure instructions were reviewed.  Patient left the clinic in stable condition.

## 2021-07-20 ENCOUNTER — Other Ambulatory Visit (HOSPITAL_COMMUNITY): Payer: Self-pay

## 2021-07-21 ENCOUNTER — Other Ambulatory Visit (HOSPITAL_COMMUNITY): Payer: Self-pay

## 2021-07-21 MED ORDER — OMEPRAZOLE 20 MG PO CPDR
20.0000 mg | DELAYED_RELEASE_CAPSULE | Freq: Two times a day (BID) | ORAL | 0 refills | Status: DC
  Filled 2021-07-21: qty 180, 90d supply, fill #0

## 2021-07-21 MED ORDER — TRAMADOL HCL 50 MG PO TABS
50.0000 mg | ORAL_TABLET | Freq: Three times a day (TID) | ORAL | 0 refills | Status: DC | PRN
  Filled 2021-07-21: qty 90, 30d supply, fill #0

## 2021-07-29 ENCOUNTER — Other Ambulatory Visit (HOSPITAL_COMMUNITY): Payer: Self-pay

## 2021-08-04 ENCOUNTER — Other Ambulatory Visit (HOSPITAL_COMMUNITY): Payer: Self-pay

## 2021-08-04 ENCOUNTER — Other Ambulatory Visit: Payer: Self-pay | Admitting: Family Medicine

## 2021-08-04 DIAGNOSIS — Z0000 Encounter for general adult medical examination without abnormal findings: Secondary | ICD-10-CM | POA: Diagnosis not present

## 2021-08-04 DIAGNOSIS — Z1159 Encounter for screening for other viral diseases: Secondary | ICD-10-CM | POA: Diagnosis not present

## 2021-08-04 DIAGNOSIS — Z1211 Encounter for screening for malignant neoplasm of colon: Secondary | ICD-10-CM | POA: Diagnosis not present

## 2021-08-04 DIAGNOSIS — E7800 Pure hypercholesterolemia, unspecified: Secondary | ICD-10-CM | POA: Diagnosis not present

## 2021-08-04 DIAGNOSIS — Z13820 Encounter for screening for osteoporosis: Secondary | ICD-10-CM

## 2021-08-04 DIAGNOSIS — I10 Essential (primary) hypertension: Secondary | ICD-10-CM | POA: Diagnosis not present

## 2021-08-04 DIAGNOSIS — E039 Hypothyroidism, unspecified: Secondary | ICD-10-CM | POA: Diagnosis not present

## 2021-08-04 DIAGNOSIS — Z1231 Encounter for screening mammogram for malignant neoplasm of breast: Secondary | ICD-10-CM | POA: Diagnosis not present

## 2021-08-06 DIAGNOSIS — I10 Essential (primary) hypertension: Secondary | ICD-10-CM | POA: Diagnosis not present

## 2021-08-18 ENCOUNTER — Other Ambulatory Visit (HOSPITAL_COMMUNITY): Payer: Self-pay

## 2021-09-03 ENCOUNTER — Other Ambulatory Visit (HOSPITAL_COMMUNITY): Payer: Self-pay

## 2021-09-04 ENCOUNTER — Other Ambulatory Visit (HOSPITAL_COMMUNITY): Payer: Self-pay | Admitting: Nurse Practitioner

## 2021-09-04 ENCOUNTER — Other Ambulatory Visit (HOSPITAL_COMMUNITY): Payer: Self-pay

## 2021-09-04 ENCOUNTER — Ambulatory Visit (HOSPITAL_COMMUNITY)
Admission: RE | Admit: 2021-09-04 | Discharge: 2021-09-04 | Disposition: A | Discharge status: Home / Self Care | Payer: PRIVATE HEALTH INSURANCE | Source: Ambulatory Visit | Attending: Nurse Practitioner | Admitting: Nurse Practitioner

## 2021-09-04 DIAGNOSIS — S161XXA Strain of muscle, fascia and tendon at neck level, initial encounter: Secondary | ICD-10-CM

## 2021-09-04 DIAGNOSIS — M4802 Spinal stenosis, cervical region: Secondary | ICD-10-CM | POA: Diagnosis not present

## 2021-09-04 DIAGNOSIS — M4312 Spondylolisthesis, cervical region: Secondary | ICD-10-CM | POA: Diagnosis not present

## 2021-09-04 DIAGNOSIS — M47812 Spondylosis without myelopathy or radiculopathy, cervical region: Secondary | ICD-10-CM | POA: Diagnosis not present

## 2021-09-04 MED ORDER — CYCLOBENZAPRINE HCL 10 MG PO TABS
10.0000 mg | ORAL_TABLET | Freq: Every evening | ORAL | 0 refills | Status: DC | PRN
  Filled 2021-09-04: qty 30, 30d supply, fill #0

## 2021-09-16 ENCOUNTER — Other Ambulatory Visit (HOSPITAL_COMMUNITY): Payer: Self-pay

## 2021-09-16 MED ORDER — TRAMADOL HCL 50 MG PO TABS
50.0000 mg | ORAL_TABLET | Freq: Three times a day (TID) | ORAL | 0 refills | Status: DC | PRN
  Filled 2021-09-16: qty 90, 30d supply, fill #0

## 2021-09-25 NOTE — Therapy (Signed)
OUTPATIENT PHYSICAL THERAPY CERVICAL EVALUATION   Patient Name: Theresa Nolan MRN: [id number] DOB:1955/08/13, 66 y.o., female Today's Date: 09/26/2021   PT End of Session - 09/26/21 0839     Visit Number 1    Number of Visits 12    Date for PT Re-Evaluation 11/21/21    Authorization Type Workers comp    Authorization - Visit Number 1    Authorization - Number of Visits 12    PT Start Time 0845    PT Stop Time 0931    PT Time Calculation (min) 46 min    Activity Tolerance Patient tolerated treatment well    Behavior During Therapy WFL for tasks assessed/performed             Past Medical History:  Diagnosis Date   Chronic back pain    Degenerative joint disease    GERD (gastroesophageal reflux disease)    Hypercholesteremia    Hypertension    Hypothyroidism    Lacunar infarction ([place])    Stroke ([place])    TSH elevation    Past Surgical History:  Procedure Laterality Date   ABDOMINAL HYSTERECTOMY     ovaries remain    APPENDECTOMY  1989-1990   CARDIAC CATHETERIZATION     10/08/04: 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 NL LVF. EF 60%.   CHOLECYSTECTOMY N/A 05/24/2014   Procedure: LAPAROSCOPIC CHOLECYSTECTOMY WITH INTRAOPERATIVE CHOLANGIOGRAM;  Surgeon: Coralie Keens, MD;  Location: [facility];  Service: General;  Laterality: N/A;   HERNIA REPAIR     umbilical hernia repair as a child   JOINT REPLACEMENT     TONSILLECTOMY     TOTAL HIP ARTHROPLASTY Left 12/28/2017   TOTAL HIP ARTHROPLASTY Left 12/28/2017   Procedure: LEFT TOTAL HIP ARTHROPLASTY ANTERIOR APPROACH;  Surgeon: Mcarthur Rossetti, MD;  Location: [facility];  Service: Orthopedics;  Laterality: Left;   Patient Active Problem List   Diagnosis Date Noted   Unilateral primary osteoarthritis, left hip 12/28/2017   Status post total replacement of left hip 12/28/2017   Cholecystitis 05/23/2014   Acute cholecystitis    HYPERLIPIDEMIA-MIXED 07/17/2009   HYPERTENSION, BENIGN 07/17/2009   SYNCOPE AND  COLLAPSE 07/17/2009   EDEMA 07/17/2009    PCP: Kelton Pillar MD  REFERRING PROVIDER: Drucilla Chalet NP  REFERRING DIAG: S16.1XXA (ICD-10-CM) - Strain of muscle, fascia and tendon at neck level, initial encounter S16.1XXA (ICD-10-CM) - Strain of neck  THERAPY DIAG:  Cervicalgia  Abnormal posture  Rationale for Evaluation and Treatment Rehabilitation  ONSET DATE: 08/18/21 per referral  SUBJECTIVE:  SUBJECTIVE STATEMENT: Pt states her and a nurse tech were repositioning a large patient when pt felt a "twinge" in her neck. Pt states next day she developed a headache that did not improve with medication, lasted about a week. Pt states headaches have improved in intensity since onset although remain near constant, range of motion is improving but coming back to neutral position feels painful/pressure. 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 Denies N/T or UE weakness. Denies visual changes or aura. Denies limitations in daily activities  PERTINENT HISTORY:  Hx lacunar infarct per pt, HLD, HTN, previous L THA  PAIN:  Are you having pain: Neck, 5-6/10 currently Location: mid cervical, central. Describes "rams horn" distribution for headache  How would you describe your pain? Pressure, headaches Best: 5-6/10  Worst: 5-6/10 over past week (initially got up to 8/10) Aggravating factors: coming back neutral, bending forward, sitting and looking at computer screen for prolonged periods (charting/documentation) Easing factors: medication, rest, ice/heat (ice better than heat), lying flat on back   PRECAUTIONS: None  WEIGHT BEARING RESTRICTIONS No  FALLS:  Has patient fallen in last 6 months? No  LIVING ENVIRONMENT: Lives with: lives alone Lives in: House/apartment,  3rd floor stair access Stairs: 18 steps total per pt, unilat rail Has following equipment at home: Gilford Rile - 2 wheeled  OCCUPATION: LPN  PLOF: Independent  PATIENT GOALS  "pain free"  OBJECTIVE:   DIAGNOSTIC FINDINGS:  Per cervical XR 09/05/2021 FINDINGS: Trace retrolisthesis of C4 on C5. Vertebral body heights are normal. Anterior spurring from C3-C4 through C7-T1, mild associated disc space narrowing most prominent at C4-C5. Mild-to-moderate multilevel facet hypertrophy. Limited assessment of left bony neural foramina due to positioning. No evidence of fracture. No prevertebral soft tissue thickening.   IMPRESSION: 1. No acute radiographic findings. 2. Mild multilevel degenerative change. 3. Trace retrolisthesis of C4 on C5.  PATIENT SURVEYS:  FOTO 51   COGNITION: Overall cognitive status: Within functional limits for tasks assessed   SENSATION: WFL BUE  POSTURE:  forward head posture, elevated UTs  (L more prominent than R)  PALPATION: Tenderness to palpation B UT/LS, significant relief with STM and improvement in headache. Most tender upper paracervical musculature, suboccipitals, improves with STM. Reports nonpainful stretch with cervical distraction, also non-relieving.    CERVICAL ROM:   Active ROM A/PROM (deg) eval  Flexion 100% p!  Extension 100% p!  Right rotation 50 p!  Left rotation 38 p!   (Blank rows = not tested) Comments: R rotation more painful than L, all other directions roughly equivalent and provoke mid cervical pain  UPPER EXTREMITY ROM:  Active ROM Right eval Left eval  Shoulder flexion [facility]  Shoulder abduction [facility] WFL  Elbow flexion [facility] WFL  Elbow extension WFL WFL   (Blank rows = not tested) Comments: does exhibit neck pain/headache with movement but no mobility limitations  UPPER EXTREMITY MMT:  MMT Right eval Left eval  Shoulder flexion 4- 4-  Shoulder internal rotation 4 4  Shoulder external rotation 4 4   (Blank  rows = not tested)  Comments: nonpainful "strain" in upper back with shoulder flex   TODAY'S TREATMENT:  HEP performance/review and education as below, emphasis on eccentric portion of shoulder shrugs   PATIENT EDUCATION:  Education details: Pt education on PT impairments, prognosis, and POC. Rationale for interventions, safe/appropriate HEP performance Person educated: Patient Education method: Explanation, Demonstration, Tactile cues, Verbal cues, and Handouts Education comprehension: verbalized understanding, returned demonstration, verbal cues  required, tactile cues required, and needs further education    HOME EXERCISE PROGRAM: Access Code: [email or web address] URL: [email or web address] Date: 09/26/2021 Prepared by: Enis Slipper  Exercises - Seated Scapular Retraction  - 1 x daily - 7 x weekly - 3 sets - 10 reps - Seated Shoulder Shrugs  - 1 x daily - 7 x weekly - 3 sets - 10 reps - Seated Cervical Retraction  - 1 x daily - 7 x weekly - 3 sets - 10 reps  ASSESSMENT:  CLINICAL IMPRESSION: Patient is a 66 y.o. woman who was seen today for physical therapy evaluation and treatment for neck pain and headache after feeling a "twinge" while repositioning/adjusting a reportedly obese patient. Pt demonstrates reduced cervical mobility and glenohumeral strength on this date, pain with active neck movement. Pt is also tender to palpation throughout upper traps B and paracervical musculature - reports good relief of neck pain and headache of 3/10 after soft tissue mobilization (5-6/10 on arrival). Pt with good tolerance to HEP program, initial coaching/cues required but no increase in pain with performance. Pt currently limited in tolerance to work duties d/t aforementioned deficits, recommend skilled PT to address these deficits and maximize functional independence and tolerance to work duties. Pt departs today's session in no acute distress, all voiced questions/concerns addressed  appropriately from PT perspective.     OBJECTIVE IMPAIRMENTS decreased activity tolerance, decreased endurance, decreased mobility, decreased ROM, decreased strength, impaired flexibility, and pain.   ACTIVITY LIMITATIONS carrying, lifting, bending, sitting, and caring for others  PARTICIPATION LIMITATIONS: community activity and occupation  [employer] are also affecting patient's functional outcome.   REHAB POTENTIAL: Good  CLINICAL DECISION MAKING: Stable/uncomplicated  EVALUATION COMPLEXITY: Low   GOALS: Goals reviewed with patient? Yes  SHORT TERM GOALS: Target date: 10/24/2021   Pt will score at least 58 on FOTO in order to demonstrate improved perception of function due to symptoms and increased participation in work activities.  Baseline: 51 Goal status: INITIAL  Pt will demonstrate appropriate understanding and performance of initially prescribed HEP in order to facilitate improved independence with management of symptoms.  Baseline: HEP provided on eval Goal status: INITIAL   2. Pt will report improved pain of 3/10 on NPS while performing work duties such as sitting for documentation/chart review.   Baseline: 5-6/10 NPS  Goal status: INITIAL    LONG TERM GOALS: Target date: 11/21/2021  Pt will score 65 on FOTO in order to demonstrate improved perception of function d/t neck pain. Baseline: 51 Goal status: INITIAL  2.  Pt will demonstrate at least 65 degrees of active cervical rotation in order to maximize cervical mobility for environmental awareness/safety in work setting. Baseline: 50 deg R rotation, 38 deg L rotation Goal status: INITIAL  3.  Pt will demonstrate at least 4+/5 shoulder flexion MMT for improved symmetry of UE strength and improved tolerance to functional movements.  Baseline: 4- Goal status: INITIAL     PLAN: PT FREQUENCY: 1-2x/week (plan for 2x/week first 4 weeks, followed by 1x/week for 4 weeks)  PT DURATION: 8 weeks  (12 visits total)  PLANNED INTERVENTIONS: Therapeutic exercises, Therapeutic activity, Neuromuscular re-education, Balance training, Gait training, Patient/Family education, Self Care, Joint mobilization, Joint manipulation, Stair training, Aquatic Therapy, Dry Needling, Electrical stimulation, Cryotherapy, Moist heat, Manual therapy, and Re-evaluation  PLAN FOR NEXT SESSION: Progress periscapular/cervical ROM/strengthening exercises as able/appropriate, review HEP.   Leeroy Cha PT, DPT 09/26/2021 12:36 PM

## 2021-09-26 ENCOUNTER — Encounter: Payer: Self-pay | Admitting: Physical Therapy

## 2021-09-26 ENCOUNTER — Other Ambulatory Visit: Payer: Self-pay

## 2021-09-26 ENCOUNTER — Ambulatory Visit: Payer: PRIVATE HEALTH INSURANCE | Attending: Family Medicine | Admitting: Physical Therapy

## 2021-09-26 DIAGNOSIS — R293 Abnormal posture: Secondary | ICD-10-CM | POA: Diagnosis present

## 2021-09-26 DIAGNOSIS — M542 Cervicalgia: Secondary | ICD-10-CM

## 2021-09-29 NOTE — Therapy (Signed)
OUTPATIENT PHYSICAL THERAPY TREATMENT NOTE   Patient Name: Theresa Nolan MRN: [id number] DOB:03-27-1955, 66 y.o., female Today's Date: 09/30/2021  PCP: Kelton Pillar MD   REFERRING PROVIDER: Drucilla Chalet NP  END OF SESSION:   PT End of Session - 09/30/21 1326     Visit Number 2    Number of Visits 12    Date for PT Re-Evaluation 11/21/21    Authorization Type Workers comp    Authorization - Visit Number 2    Authorization - Number of Visits 12    PT Start Time 1327    PT Stop Time 1407    PT Time Calculation (min) 40 min    Activity Tolerance Patient tolerated treatment well    Behavior During Therapy WFL for tasks assessed/performed             Past Medical History:  Diagnosis Date   Chronic back pain    Degenerative joint disease    GERD (gastroesophageal reflux disease)    Hypercholesteremia    Hypertension    Hypothyroidism    Lacunar infarction ([place])    Stroke ([place])    TSH elevation    Past Surgical History:  Procedure Laterality Date   ABDOMINAL HYSTERECTOMY     ovaries remain    APPENDECTOMY  1989-1990   CARDIAC CATHETERIZATION     10/08/04: 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 NL LVF. EF 60%.   CHOLECYSTECTOMY N/A 05/24/2014   Procedure: LAPAROSCOPIC CHOLECYSTECTOMY WITH INTRAOPERATIVE CHOLANGIOGRAM;  Surgeon: Coralie Keens, MD;  Location: [place];  Service: General;  Laterality: N/A;   HERNIA REPAIR     umbilical hernia repair as a child   JOINT REPLACEMENT     TONSILLECTOMY     TOTAL HIP ARTHROPLASTY Left 12/28/2017   TOTAL HIP ARTHROPLASTY Left 12/28/2017   Procedure: LEFT TOTAL HIP ARTHROPLASTY ANTERIOR APPROACH;  Surgeon: Mcarthur Rossetti, MD;  Location: [place];  Service: Orthopedics;  Laterality: Left;   Patient Active Problem List   Diagnosis Date Noted   Unilateral primary osteoarthritis, left hip 12/28/2017   Status post total replacement of left hip 12/28/2017   Cholecystitis 05/23/2014   Acute cholecystitis     HYPERLIPIDEMIA-MIXED 07/17/2009   HYPERTENSION, BENIGN 07/17/2009   SYNCOPE AND COLLAPSE 07/17/2009   EDEMA 07/17/2009    REFERRING DIAG: REFERRING DIAG: S16.1XXA (ICD-10-CM) - Strain of muscle, fascia and tendon at neck level, initial encounter S16.1XXA (ICD-10-CM) - Strain of neck  THERAPY DIAG:  Cervicalgia  Abnormal posture  Rationale for Evaluation and Treatment Rehabilitation  PERTINENT HISTORY: Hx lacunar infarct per pt, HLD, HTN, previous L THA  PRECAUTIONS: none  PAIN: per initial evaluation Are you having pain: Neck, 5-6/10 currently Location: mid cervical, central. Describes "rams horn" distribution for headache  How would you describe your pain? Pressure, headaches Best: 5-6/10  Worst: 5-6/10 over past week (initially got up to 8/10) Aggravating factors: coming back neutral, bending forward, sitting and looking at computer screen for prolonged periods (charting/documentation) Easing factors: medication, rest, ice/heat (ice better than heat), lying flat on back   SUBJECTIVE: Pt arrives w/ 5/10 pain on NPS, reports improved ability to sleep after initial eval. Reports good compliance with HEP. Saw ortho yesterday, advised to continue with PT, follow up in a month. No other new changes reported, pt pleasant and agreeable for PT.      OBJECTIVE: (objective measures completed at initial evaluation unless otherwise dated)     DIAGNOSTIC FINDINGS:  Per cervical XR 09/05/2021 FINDINGS:  Trace retrolisthesis of C4 on C5. Vertebral body heights are normal. Anterior spurring from C3-C4 through C7-T1, mild associated disc space narrowing most prominent at C4-C5. Mild-to-moderate multilevel facet hypertrophy. Limited assessment of left bony neural foramina due to positioning. No evidence of fracture. No prevertebral soft tissue thickening.   IMPRESSION: 1. No acute radiographic findings. 2. Mild multilevel degenerative change. 3. Trace retrolisthesis of C4 on C5.    PATIENT SURVEYS:  FOTO 51     COGNITION: Overall cognitive status: Within functional limits for tasks assessed     SENSATION: WFL BUE   POSTURE:  forward head posture, elevated UTs  (L more prominent than R)   PALPATION: Tenderness to palpation B UT/LS, significant relief with STM and improvement in headache. Most tender upper paracervical musculature, suboccipitals, improves with STM. Reports nonpainful stretch with cervical distraction, also non-relieving.      CERVICAL ROM:    Active ROM A/PROM (deg) eval  Flexion 100% p!  Extension 100% p!  Right rotation 50 p!  Left rotation 38 p!   (Blank rows = not tested) Comments: R rotation more painful than L, all other directions roughly equivalent and provoke mid cervical pain   UPPER EXTREMITY ROM:   Active ROM Right eval Left eval  Shoulder flexion [facility]  Shoulder abduction [facility] WFL  Elbow flexion [facility] WFL  Elbow extension WFL WFL   (Blank rows = not tested) Comments: does exhibit neck pain/headache with movement but no mobility limitations   UPPER EXTREMITY MMT:   MMT Right eval Left eval  Shoulder flexion 4- 4-  Shoulder internal rotation 4 4  Shoulder external rotation 4 4   (Blank rows = not tested)   Comments: nonpainful "strain" in upper back with shoulder flex     TODAY'S TREATMENT:  [facility] Adult PT Treatment:                                                DATE: 09/30/21 Therapeutic Exercise: Upper trap stretch - 3x30sec B w cues for breath control and appropriate ROM Swiss ball roll out fwd only, 2x10 reps, seated, for improved cervicothoracic extension, second round with addition of UE push downs at end range   Manual Therapy: Supine - STM to paracervical musculature, upper traps, levator scap, addition of gentle active cervical rotation during STM/trigger point release. Gentle cervical distraction and suboccipital release  Neuromuscular re-ed: Seated chin tuck 2x15 for DNF activation,  tactile/verbal cues as needed  Seated chin tuck + rotations 2x8 B, for improved dynamic cervical stabilization, cues for form, pacing, breath control Shoulder shrugs, 2x15 for improved UT activation/coordination, emphasis on eccentric portion, tactile/verbal cues as needed as needed for posture and breath control Seated scapular retraction w B ER red band 2x8 for improved periscapular activation, tactile/verbal cues as needed as needed, cues to mitigate compensations at upper traps GH scaption to 90 B with red theraband around wrists 2x8 to increase periscapular/GH activation/stability, cues for form and pacing       PATIENT EDUCATION:  Education details: rationale for interventions throughout, education on PT progression Person educated: Patient Education method: Explanation, Demonstration, Tactile cues, Verbal cues Education comprehension: verbalized understanding, returned demonstration, verbal cues required, tactile cues required, and needs further education      HOME EXERCISE PROGRAM: Access Code: [email or web address] URL: [email or web address] Date: 09/26/2021 Prepared by: Enis Slipper  Exercises - Seated Scapular Retraction  - 1 x daily - 7 x weekly - 3 sets - 10 reps - Seated Shoulder Shrugs  - 1 x daily - 7 x weekly - 3 sets - 10 reps - Seated Cervical Retraction  - 1 x daily - 7 x weekly - 3 sets - 10 reps   ASSESSMENT:   CLINICAL IMPRESSION: Pt is a 66 y.o woman arriving to PT on this date for follow up w/ neck pain and headache. Session initiated with manual therapy in supine - reduced tenderness compared to initial evaluation but continues with mild Tenderness to palpation of paracervical musculature and suboccipitals. Headache/neck pain improved to 3/10 post manual (5/10 on arrival). Able to progress for reduced rest breaks with cervical activation exercises and addition of resistance for periscapular activation exercises. Pt does report some mild increase in headache  with upper trap stretches but returns to baseline levels with subsequent cervical stabilization exercises. Pt remains limited by postural deficits/fatigue and pain and reduced cervicothoracic mobility/stability. Tolerates session well with no adverse events, departs with 3/10 pain. Recommend skilled PT to address aforementioned deficits to maximize functional independence and tolerance. Pt departs today's session in no acute distress, all voiced questions/concerns addressed appropriately from PT perspective.       OBJECTIVE IMPAIRMENTS decreased activity tolerance, decreased endurance, decreased mobility, decreased ROM, decreased strength, impaired flexibility, and pain.    ACTIVITY LIMITATIONS carrying, lifting, bending, sitting, and caring for others   PARTICIPATION LIMITATIONS: community activity and occupation   [place] are also affecting patient's functional outcome.    REHAB POTENTIAL: Good   CLINICAL DECISION MAKING: Stable/uncomplicated   EVALUATION COMPLEXITY: Low     GOALS: Goals reviewed with patient? Yes   SHORT TERM GOALS: Target date: 10/24/2021    Pt will score at least 58 on FOTO in order to demonstrate improved perception of function due to symptoms and increased participation in work activities.  Baseline: 51 Goal status: INITIAL   Pt will demonstrate appropriate understanding and performance of initially prescribed HEP in order to facilitate improved independence with management of symptoms.  Baseline: HEP provided on eval Goal status: INITIAL    2. Pt will report improved pain of 3/10 on NPS while performing work duties such as sitting for documentation/chart review.             Baseline: 5-6/10 NPS            Goal status: INITIAL       LONG TERM GOALS: Target date: 11/21/2021   Pt will score 65 on FOTO in order to demonstrate improved perception of function d/t neck pain. Baseline: 51 Goal status: INITIAL   2.  Pt will demonstrate at  least 65 degrees of active cervical rotation in order to maximize cervical mobility for environmental awareness/safety in work setting. Baseline: 50 deg R rotation, 38 deg L rotation Goal status: INITIAL   3.  Pt will demonstrate at least 4+/5 shoulder flexion MMT for improved symmetry of UE strength and improved tolerance to functional movements.  Baseline: 4- Goal status: INITIAL        PLAN: PT FREQUENCY: 1-2x/week (plan for 2x/week first 4 weeks, followed by 1x/week for 4 weeks)   PT DURATION: 8 weeks (12 visits total)   PLANNED INTERVENTIONS: Therapeutic exercises, Therapeutic activity, Neuromuscular re-education, Balance training, Gait training, Patient/Family education, Self Care, Joint mobilization, Joint manipulation, Stair training, Aquatic Therapy, Dry Needling, Electrical stimulation, Cryotherapy, Moist heat, Manual therapy, and Re-evaluation  PLAN FOR NEXT SESSION: Progress HEP as appropriate. Progress periscapular activation/strengthening. Incorporate closed chain core/shoulder stability as able/appropriate.     Leeroy Cha PT, DPT 09/30/2021 2:12 PM

## 2021-09-30 ENCOUNTER — Encounter: Payer: Self-pay | Admitting: Physical Therapy

## 2021-09-30 ENCOUNTER — Ambulatory Visit: Payer: PRIVATE HEALTH INSURANCE | Admitting: Physical Therapy

## 2021-09-30 DIAGNOSIS — R293 Abnormal posture: Secondary | ICD-10-CM

## 2021-09-30 DIAGNOSIS — M542 Cervicalgia: Secondary | ICD-10-CM

## 2021-10-01 ENCOUNTER — Other Ambulatory Visit (HOSPITAL_COMMUNITY): Payer: Self-pay

## 2021-10-02 ENCOUNTER — Ambulatory Visit: Payer: PRIVATE HEALTH INSURANCE | Admitting: Physical Therapy

## 2021-10-02 ENCOUNTER — Other Ambulatory Visit (HOSPITAL_COMMUNITY): Payer: Self-pay

## 2021-10-02 MED ORDER — SIMVASTATIN 40 MG PO TABS
40.0000 mg | ORAL_TABLET | Freq: Every evening | ORAL | 1 refills | Status: DC
Start: 2021-10-02 — End: 2022-04-22
  Filled 2021-10-02: qty 90, 90d supply, fill #0
  Filled 2022-01-15: qty 90, 90d supply, fill #1

## 2021-10-02 MED ORDER — HYDROCHLOROTHIAZIDE 25 MG PO TABS
25.0000 mg | ORAL_TABLET | Freq: Every morning | ORAL | 1 refills | Status: DC
  Filled 2021-10-02: qty 90, 90d supply, fill #0
  Filled 2022-01-23: qty 90, 90d supply, fill #1

## 2021-10-02 MED ORDER — EZETIMIBE 10 MG PO TABS
10.0000 mg | ORAL_TABLET | Freq: Every day | ORAL | 1 refills | Status: DC
  Filled 2021-10-02: qty 90, 90d supply, fill #0
  Filled 2022-02-24: qty 90, 90d supply, fill #1

## 2021-10-03 ENCOUNTER — Other Ambulatory Visit (HOSPITAL_COMMUNITY): Payer: Self-pay

## 2021-10-06 NOTE — Therapy (Signed)
OUTPATIENT PHYSICAL THERAPY TREATMENT NOTE   Patient Name: Theresa Nolan MRN: [id number] DOB:1955-03-06, 66 y.o., female Today's Date: 10/07/2021  PCP: Kelton Pillar MD   REFERRING PROVIDER: Drucilla Chalet NP  END OF SESSION:   PT End of Session - 10/07/21 0832     Visit Number 3    Number of Visits 12    Date for PT Re-Evaluation 11/21/21    Authorization Type Workers comp    Authorization - Visit Number 3    Authorization - Number of Visits 12    PT Start Time [phone]    PT Stop Time 0925    PT Time Calculation (min) 50 min    Activity Tolerance Patient tolerated treatment well    Behavior During Therapy WFL for tasks assessed/performed              Past Medical History:  Diagnosis Date   Chronic back pain    Degenerative joint disease    GERD (gastroesophageal reflux disease)    Hypercholesteremia    Hypertension    Hypothyroidism    Lacunar infarction ([place])    Stroke ([place])    TSH elevation    Past Surgical History:  Procedure Laterality Date   ABDOMINAL HYSTERECTOMY     ovaries remain    APPENDECTOMY  1989-1990   CARDIAC CATHETERIZATION     10/08/04: 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 NL LVF. EF 60%.   CHOLECYSTECTOMY N/A 05/24/2014   Procedure: LAPAROSCOPIC CHOLECYSTECTOMY WITH INTRAOPERATIVE CHOLANGIOGRAM;  Surgeon: Coralie Keens, MD;  Location: [place];  Service: General;  Laterality: N/A;   HERNIA REPAIR     umbilical hernia repair as a child   JOINT REPLACEMENT     TONSILLECTOMY     TOTAL HIP ARTHROPLASTY Left 12/28/2017   TOTAL HIP ARTHROPLASTY Left 12/28/2017   Procedure: LEFT TOTAL HIP ARTHROPLASTY ANTERIOR APPROACH;  Surgeon: Mcarthur Rossetti, MD;  Location: [place];  Service: Orthopedics;  Laterality: Left;   Patient Active Problem List   Diagnosis Date Noted   Unilateral primary osteoarthritis, left hip 12/28/2017   Status post total replacement of left hip 12/28/2017   Cholecystitis 05/23/2014   Acute cholecystitis     HYPERLIPIDEMIA-MIXED 07/17/2009   HYPERTENSION, BENIGN 07/17/2009   SYNCOPE AND COLLAPSE 07/17/2009   EDEMA 07/17/2009    REFERRING DIAG: REFERRING DIAG: S16.1XXA (ICD-10-CM) - Strain of muscle, fascia and tendon at neck level, initial encounter S16.1XXA (ICD-10-CM) - Strain of neck  THERAPY DIAG:  Cervicalgia  Abnormal posture  Rationale for Evaluation and Treatment Rehabilitation  PERTINENT HISTORY: Hx lacunar infarct per pt, HLD, HTN, previous L THA  PRECAUTIONS: none  PAIN: per initial evaluation Are you having pain: Neck, 5-6/10 currently Location: mid cervical, central. Describes "rams horn" distribution for headache  How would you describe your pain? 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 Pt states she is currently having 7/10 pain, took tramadol earlier this AM. Before this exacerbation pt states she was "doing great". Denies any soreness after last session, states she has been doing well with HEP overall but has continued to have most difficulty with stretching exercises.  OBJECTIVE: (objective measures completed at initial evaluation unless otherwise dated)     DIAGNOSTIC FINDINGS:  Per cervical XR 09/05/2021 FINDINGS: Trace retrolisthesis of C4 on C5. Vertebral body heights are normal. Anterior spurring from C3-C4 through C7-T1, mild associated disc space narrowing most prominent at C4-C5. Mild-to-moderate multilevel facet hypertrophy. Limited assessment of left bony neural foramina due to positioning. No evidence of fracture. No prevertebral  soft tissue thickening.   IMPRESSION: 1. No acute radiographic findings. 2. Mild multilevel degenerative change. 3. Trace retrolisthesis of C4 on C5.   PATIENT SURVEYS:  FOTO 51     COGNITION: Overall cognitive status: Within functional limits for tasks assessed     SENSATION: WFL BUE   POSTURE:  forward head posture, elevated UTs  (L more prominent than R)   PALPATION: Tenderness to palpation B UT/LS, significant relief with STM and improvement in headache. Most tender upper paracervical musculature, suboccipitals, improves with STM. Reports nonpainful stretch with cervical distraction, also non-relieving.      CERVICAL ROM:    Active ROM A/PROM (deg) eval AROM 10/07/21  Flexion 100% p!   Extension 100% p!   Right rotation 50 p! 50  Left rotation 38 p! 47   (Blank rows = not tested) Comments: R rotation more painful than L, all other directions roughly equivalent and provoke mid cervical pain   UPPER EXTREMITY ROM:   Active ROM Right eval Left eval  Shoulder flexion [facility]  Shoulder abduction [facility] WFL  Elbow flexion [facility] WFL  Elbow extension WFL WFL   (Blank rows = not tested) Comments: does exhibit neck pain/headache with movement but no mobility limitations   UPPER EXTREMITY MMT:   MMT Right eval Left eval  Shoulder flexion 4- 4-  Shoulder internal rotation 4 4  Shoulder external rotation 4 4   (Blank rows = not tested)   Comments: nonpainful "strain" in upper back with shoulder flex     TODAY'S TREATMENT: [facility] Adult PT Treatment:                                                DATE: 10/07/21 Therapeutic Exercise: Swiss ball roll out fwd only, 2x15 reps, standing, for improved cervicothoracic extension, UE push down at end range second set Cervical lateral flexion isometrics 2x7 B, 3 sec hold   Manual Therapy: Supine - STM to paracervical musculature, upper traps, levator scap, addition of gentle active cervical rotation during STM/trigger point  release. Gentle cervical distraction and suboccipital release  Neuromuscular re-ed: Supine chin tuck for cervical stabilization, 3x10, cues for form, pacing, breath control Shoulder shrugs, 3x10 with 2#  each UE. for improved UT activation/coordination, emphasis on eccentric portion, tactile/verbal cues as needed as needed for posture and breath control GH scaption to 90 B with red theraband around wrists 3x8 to increase periscapular/GH activation/stability, cues for form and pacing   [facility] Adult PT Treatment:                                                DATE: 09/30/21 Therapeutic Exercise: Upper trap stretch - 3x30sec B w cues for breath control and appropriate ROM Swiss ball roll out fwd only, 2x10 reps, seated, for improved cervicothoracic extension, second round  with addition of UE push downs at end range   Manual Therapy: Supine - STM to paracervical musculature, upper traps, levator scap, addition of gentle active cervical rotation during STM/trigger point release. Gentle cervical distraction and suboccipital release  Neuromuscular re-ed: Seated chin tuck 2x15 for DNF activation, tactile/verbal cues as needed  Seated chin tuck + rotations 2x8 B, for improved dynamic cervical stabilization, cues for form, pacing, breath control Shoulder shrugs, 2x15 for improved UT activation/coordination, emphasis on eccentric portion, tactile/verbal cues as needed as needed for posture and breath control Seated scapular retraction w B ER red band 2x8 for improved periscapular activation, tactile/verbal cues as needed as needed, cues to mitigate compensations at upper traps GH scaption to 90 B with red theraband around wrists 2x8 to increase periscapular/GH activation/stability, cues for form and pacing       PATIENT EDUCATION:  Education details: rationale for interventions throughout, education on PT progression Person educated: Patient Education method: Explanation, Demonstration, Tactile cues,  Verbal cues Education comprehension: verbalized understanding, returned demonstration, verbal cues required, tactile cues required, and needs further education      HOME EXERCISE PROGRAM: Access Code: [email or web address] URL: [email or web address] Date: 09/26/2021 Prepared by: Enis Slipper   Exercises - Seated Scapular Retraction  - 1 x daily - 7 x weekly - 3 sets - 10 reps - Seated Shoulder Shrugs  - 1 x daily - 7 x weekly - 3 sets - 10 reps - Seated Cervical Retraction  - 1 x daily - 7 x weekly - 3 sets - 10 reps   ASSESSMENT:   CLINICAL IMPRESSION: Pt is a 66 y.o woman arriving to PT on this date for follow up w/ neck pain and headaches. Session initiated with manual therapy, increased time spent given increased symptoms on arrival, pain improves to 5/10 subsequently (7/10 on arrival). Given increased pain from reported exacerbation earlier this morning, initially modified exercise program for improved tolerance although symptoms improve with movement. Measurements of cervical rotation at end of session indicate no significant change in R rotation compared to evaluation, but 9 degree positive change in L rotation. Pt departs with 4/10 pain on NPS and no headache; remains limited by reduced cervicothoracic mobility and GH/periscapular weakness, postural deviations.   Recommend skilled PT to address aforementioned deficits to maximize functional independence and tolerance. Pt departs today's session in no acute distress, all voiced questions/concerns addressed appropriately from PT perspective.       OBJECTIVE IMPAIRMENTS decreased activity tolerance, decreased endurance, decreased mobility, decreased ROM, decreased strength, impaired flexibility, and pain.    ACTIVITY LIMITATIONS carrying, lifting, bending, sitting, and caring for others   PARTICIPATION LIMITATIONS: community activity and occupation   [place] are also affecting patient's functional outcome.     REHAB POTENTIAL: Good   CLINICAL DECISION MAKING: Stable/uncomplicated   EVALUATION COMPLEXITY: Low     GOALS: Goals reviewed with patient? Yes   SHORT TERM GOALS: Target date: 10/24/2021    Pt will score at least 58 on FOTO in order to demonstrate improved perception of function due to symptoms and increased participation in work activities.  Baseline: 51 Goal status: INITIAL   Pt will demonstrate appropriate understanding and performance of initially prescribed HEP in order to facilitate improved independence with management of symptoms.  Baseline: HEP provided on eval Goal status: INITIAL    2. Pt will report improved pain of 3/10 on NPS while performing work duties such as sitting for documentation/chart review.  Baseline: 5-6/10 NPS            Goal status: INITIAL       LONG TERM GOALS: Target date: 11/21/2021   Pt will score 65 on FOTO in order to demonstrate improved perception of function d/t neck pain. Baseline: 51 Goal status: INITIAL   2.  Pt will demonstrate at least 65 degrees of active cervical rotation in order to maximize cervical mobility for environmental awareness/safety in work setting. Baseline: 50 deg R rotation, 38 deg L rotation Goal status: INITIAL   3.  Pt will demonstrate at least 4+/5 shoulder flexion MMT for improved symmetry of UE strength and improved tolerance to functional movements.  Baseline: 4- Goal status: INITIAL        PLAN: PT FREQUENCY: 1-2x/week (plan for 2x/week first 4 weeks, followed by 1x/week for 4 weeks)   PT DURATION: 8 weeks (12 visits total)   PLANNED INTERVENTIONS: Therapeutic exercises, Therapeutic activity, Neuromuscular re-education, Balance training, Gait training, Patient/Family education, Self Care, Joint mobilization, Joint manipulation, Stair training, Aquatic Therapy, Dry Needling, Electrical stimulation, Cryotherapy, Moist heat, Manual therapy, and Re-evaluation   PLAN FOR NEXT SESSION: Progress  HEP as appropriate. Progress periscapular activation/strengthening. Incorporate closed chain core/shoulder stability as able/appropriate.     Leeroy Cha PT, DPT 10/07/2021 9:32 AM

## 2021-10-07 ENCOUNTER — Ambulatory Visit: Payer: PRIVATE HEALTH INSURANCE | Admitting: Physical Therapy

## 2021-10-07 DIAGNOSIS — M542 Cervicalgia: Secondary | ICD-10-CM

## 2021-10-07 DIAGNOSIS — R293 Abnormal posture: Secondary | ICD-10-CM

## 2021-10-08 NOTE — Therapy (Signed)
OUTPATIENT PHYSICAL THERAPY TREATMENT NOTE   Patient Name: Theresa Nolan MRN: [id number] DOB:[birth year], 66 y.o., female Today's Date: 10/09/2021  PCP: Kelton Pillar MD   REFERRING PROVIDER: Drucilla Chalet NP  END OF SESSION:   PT End of Session - 10/09/21 0830     Visit Number 4    Number of Visits 12    Date for PT Re-Evaluation 11/21/21    Authorization Type Workers comp    Authorization - Visit Number 4    Authorization - Number of Visits 12    PT Start Time [phone]    PT Stop Time 0925    PT Time Calculation (min) 48 min    Activity Tolerance Patient tolerated treatment well    Behavior During Therapy WFL for tasks assessed/performed               Past Medical History:  Diagnosis Date   Chronic back pain    Degenerative joint disease    GERD (gastroesophageal reflux disease)    Hypercholesteremia    Hypertension    Hypothyroidism    Lacunar infarction ([place])    Stroke ([place])    TSH elevation    Past Surgical History:  Procedure Laterality Date   ABDOMINAL HYSTERECTOMY     ovaries remain    APPENDECTOMY  1989-1990   CARDIAC CATHETERIZATION     10/08/04: 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 NL LVF. EF 60%.   CHOLECYSTECTOMY N/A 05/24/2014   Procedure: LAPAROSCOPIC CHOLECYSTECTOMY WITH INTRAOPERATIVE CHOLANGIOGRAM;  Surgeon: Coralie Keens, MD;  Location: [place];  Service: General;  Laterality: N/A;   HERNIA REPAIR     umbilical hernia repair as a child   JOINT REPLACEMENT     TONSILLECTOMY     TOTAL HIP ARTHROPLASTY Left 12/28/2017   TOTAL HIP ARTHROPLASTY Left 12/28/2017   Procedure: LEFT TOTAL HIP ARTHROPLASTY ANTERIOR APPROACH;  Surgeon: Mcarthur Rossetti, MD;  Location: [place];  Service: Orthopedics;  Laterality: Left;   Patient Active Problem List   Diagnosis Date Noted   Unilateral primary osteoarthritis, left hip 12/28/2017   Status post total replacement of left hip 12/28/2017   Cholecystitis 05/23/2014   Acute cholecystitis     HYPERLIPIDEMIA-MIXED 07/17/2009   HYPERTENSION, BENIGN 07/17/2009   SYNCOPE AND COLLAPSE 07/17/2009   EDEMA 07/17/2009    REFERRING DIAG: REFERRING DIAG: S16.1XXA (ICD-10-CM) - Strain of muscle, fascia and tendon at neck level, initial encounter S16.1XXA (ICD-10-CM) - Strain of neck  THERAPY DIAG:  Cervicalgia  Abnormal posture  Rationale for Evaluation and Treatment Rehabilitation  PERTINENT HISTORY: Hx lacunar infarct per pt, HLD, HTN, previous L THA  PRECAUTIONS: none  SUBJECTIVE: Pt arrives and states she is feeling better, states exercises at last session seemed to help. Reports good compliance with HEP, states exercises are going well. No other new updates.    PAIN:  Are you having pain: yes, 3/10 NPS Location: mid cervical, denies any headaches in the past week How would you describe your pain? 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 Vertebral body heights are normal. Anterior spurring from C3-C4 through C7-T1, mild  associated disc space narrowing most prominent at C4-C5. Mild-to-moderate multilevel facet hypertrophy. Limited assessment of left bony neural foramina due to positioning. No evidence of fracture. No prevertebral soft tissue thickening.   IMPRESSION: 1. No acute radiographic findings. 2. Mild multilevel degenerative change. 3. Trace retrolisthesis of C4 on C5.   PATIENT SURVEYS:  FOTO 51     COGNITION: Overall cognitive status: Within functional limits for tasks assessed     SENSATION: WFL BUE    POSTURE:  forward head posture, elevated UTs  (L more prominent than R)   PALPATION: Tenderness to palpation B UT/LS, significant relief with STM and improvement in headache. Most tender upper paracervical musculature, suboccipitals, improves with STM. Reports nonpainful stretch with cervical distraction, also non-relieving.      CERVICAL ROM:    Active ROM A/PROM (deg) eval AROM 10/07/21  Flexion 100% p!   Extension 100% p!   Right rotation 50 p! 50  Left rotation 38 p! 47   (Blank rows = not tested) Comments: R rotation more painful than L, all other directions roughly equivalent and provoke mid cervical pain   UPPER EXTREMITY ROM:   Active ROM Right eval Left eval  Shoulder flexion [facility]  Shoulder abduction [facility] WFL  Elbow flexion [facility] WFL  Elbow extension WFL WFL   (Blank rows = not tested) Comments: does exhibit neck pain/headache with movement but no mobility limitations   UPPER EXTREMITY MMT:   MMT Right eval Left eval  Shoulder flexion 4- 4-  Shoulder internal rotation 4 4  Shoulder external rotation 4 4   (Blank rows = not tested)   Comments: nonpainful "strain" in upper back with shoulder flex     TODAY'S TREATMENT: [facility] Adult PT Treatment:                                                DATE: 10/09/2021  Therapeutic Exercise: Swiss ball roll out fwd only, x20 reps, standing, for improved cervicothoracic extension, UE push down at end range second set B ER + scap retraction, RTB, 2x15, cues for upright posture and form Cervical lateral flexion isos 3x8 3 sec hold, cues for posture and pacing RTB rows 2x12, cues for posture and pacing, reduced UT compensations  Manual Therapy: Supine - STM to paracervical musculature, upper traps, levator scap. Increased time spent with gentle cervical distraction and suboccipital release  Neuromuscular re-ed: [place] scaption to 90 B with red theraband around wrists 3x8 to increase periscapular/GH activation/stability, cues  for form and pacing Shoulder shrugs, 3x8 3# DB each UE, for improved motor control of UT, cues for appropriate form, emphasis on eccentric portion and breath control Chin tuck, standing at wall with ball, 3x8 for improved DNF activation, cues to minimize compensations    [facility] Adult PT Treatment:                                                DATE: 10/07/21 Therapeutic Exercise: Swiss ball roll out fwd only, 2x15 reps, standing, for improved cervicothoracic extension, UE push down at end range second set Cervical lateral flexion isometrics 2x7 B, 3 sec hold Manual Therapy: Supine - STM to paracervical musculature, upper traps, levator scap, addition of gentle active  cervical rotation during STM/trigger point release. Gentle cervical distraction and suboccipital release Neuromuscular re-ed: Supine chin tuck for cervical stabilization, 3x10, cues for form, pacing, breath control Shoulder shrugs, 3x10 with 2#  each UE. for improved UT activation/coordination, emphasis on eccentric portion, tactile/verbal cues as needed as needed for posture and breath control GH scaption to 90 B with red theraband around wrists 3x8 to increase periscapular/GH activation/stability, cues for form and pacing        PATIENT EDUCATION:  Education details: rationale for interventions throughout, education on PT progression, update for HEP Person educated: Patient Education method: Explanation, Demonstration, Tactile cues, Verbal cues Education comprehension: verbalized understanding, returned demonstration, verbal cues required, tactile cues required, and needs further education      HOME EXERCISE PROGRAM: Access Code: [email or web address] URL: [email or web address] Date: 10/09/2021 Prepared by: Enis Slipper  Exercises - Seated Shoulder Shrugs  - 1 x daily - 7 x weekly - 3 sets - 10 reps - Shoulder External Rotation and Scapular Retraction with Resistance  - 1 x daily - 7 x weekly - 3 sets - 10 reps -  Standing Isometric Cervical Sidebending with Manual Resistance  - 1 x daily - 7 x weekly - 3 sets - 8 reps - 3sec hold   ASSESSMENT:   CLINICAL IMPRESSION: Pt is a 66 y.o woman arriving to PT on this date for follow up w/ neck pain and headaches.cToday's session emphasizing progression of periscapular/GH stability/activation exercises, able to progress for increased volume and complexity overall Pt tolerates session well, cues for reduced compensations and education for HEP update. Manual deferred until end of session, pt reports good relief of pain and soreness. Pt continues to demonstrate reduced cervicothoracic mobility and GH/periscapular weakness, postural deviations. Departs with report of 2/10 pain on NPS and improved symptoms overall.   Recommend skilled PT to address aforementioned deficits to maximize functional independence and tolerance. Pt departs today's session in no acute distress, all voiced questions/concerns addressed appropriately from PT perspective.        OBJECTIVE IMPAIRMENTS decreased activity tolerance, decreased endurance, decreased mobility, decreased ROM, decreased strength, impaired flexibility, and pain.    ACTIVITY LIMITATIONS carrying, lifting, bending, sitting, and caring for others   PARTICIPATION LIMITATIONS: community activity and occupation   [place] are also affecting patient's functional outcome.    REHAB POTENTIAL: Good   CLINICAL DECISION MAKING: Stable/uncomplicated   EVALUATION COMPLEXITY: Low     GOALS: Goals reviewed with patient? Yes   SHORT TERM GOALS: Target date: 10/24/2021    Pt will score at least 58 on FOTO in order to demonstrate improved perception of function due to symptoms and increased participation in work activities.  Baseline: 51 Goal status: INITIAL   Pt will demonstrate appropriate understanding and performance of initially prescribed HEP in order to facilitate improved independence with management  of symptoms.  Baseline: HEP provided on eval Goal status: INITIAL    2. Pt will report improved pain of 3/10 on NPS while performing work duties such as sitting for documentation/chart review.             Baseline: 5-6/10 NPS            Goal status: INITIAL       LONG TERM GOALS: Target date: 11/21/2021   Pt will score 65 on FOTO in order to demonstrate improved perception of function d/t neck pain. Baseline: 51 Goal status: INITIAL   2.  Pt will demonstrate at least 65 degrees of active cervical  rotation in order to maximize cervical mobility for environmental awareness/safety in work setting. Baseline: 50 deg R rotation, 38 deg L rotation Goal status: INITIAL   3.  Pt will demonstrate at least 4+/5 shoulder flexion MMT for improved symmetry of UE strength and improved tolerance to functional movements.  Baseline: 4- Goal status: INITIAL        PLAN: PT FREQUENCY: 1-2x/week (plan for 2x/week first 4 weeks, followed by 1x/week for 4 weeks)   PT DURATION: 8 weeks (12 visits total)   PLANNED INTERVENTIONS: Therapeutic exercises, Therapeutic activity, Neuromuscular re-education, Balance training, Gait training, Patient/Family education, Self Care, Joint mobilization, Joint manipulation, Stair training, Aquatic Therapy, Dry Needling, Electrical stimulation, Cryotherapy, Moist heat, Manual therapy, and Re-evaluation   PLAN FOR NEXT SESSION: Review HEP and modify as appropriate. Progress resistance for periscapular strengthening and incorporate more functional movement patterns as appropriate/able.    Leeroy Cha PT, DPT 10/09/2021 9:39 AM

## 2021-10-09 ENCOUNTER — Ambulatory Visit: Payer: PRIVATE HEALTH INSURANCE | Attending: Nurse Practitioner | Admitting: Physical Therapy

## 2021-10-09 ENCOUNTER — Encounter: Payer: Self-pay | Admitting: Physical Therapy

## 2021-10-09 DIAGNOSIS — M542 Cervicalgia: Secondary | ICD-10-CM | POA: Diagnosis present

## 2021-10-09 DIAGNOSIS — R293 Abnormal posture: Secondary | ICD-10-CM | POA: Diagnosis present

## 2021-10-12 ENCOUNTER — Other Ambulatory Visit (HOSPITAL_COMMUNITY): Payer: Self-pay

## 2021-10-14 ENCOUNTER — Encounter: Payer: 59 | Admitting: Physical Therapy

## 2021-10-14 NOTE — Therapy (Signed)
OUTPATIENT PHYSICAL THERAPY TREATMENT NOTE   Patient Name: Theresa Nolan MRN: [id number] DOB:1955/07/30, 66 y.o., female Today's Date: 10/15/2021  PCP: Kelton Pillar MD   REFERRING PROVIDER: Drucilla Chalet NP  END OF SESSION:   PT End of Session - 10/15/21 1505     Visit Number 5    Number of Visits 12    Date for PT Re-Evaluation 11/21/21    Authorization Type Workers comp    Authorization - Visit Number 5    Authorization - Number of Visits 12    PT Start Time 1505    PT Stop Time 1545    PT Time Calculation (min) 40 min    Activity Tolerance Patient tolerated treatment well    Behavior During Therapy WFL for tasks assessed/performed                Past Medical History:  Diagnosis Date   Chronic back pain    Degenerative joint disease    GERD (gastroesophageal reflux disease)    Hypercholesteremia    Hypertension    Hypothyroidism    Lacunar infarction ([place])    Stroke ([place])    TSH elevation    Past Surgical History:  Procedure Laterality Date   ABDOMINAL HYSTERECTOMY     ovaries remain    APPENDECTOMY  1989-1990   CARDIAC CATHETERIZATION     10/08/04: 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 NL LVF. EF 60%.   CHOLECYSTECTOMY N/A 05/24/2014   Procedure: LAPAROSCOPIC CHOLECYSTECTOMY WITH INTRAOPERATIVE CHOLANGIOGRAM;  Surgeon: Coralie Keens, MD;  Location: [place];  Service: General;  Laterality: N/A;   HERNIA REPAIR     umbilical hernia repair as a child   JOINT REPLACEMENT     TONSILLECTOMY     TOTAL HIP ARTHROPLASTY Left 12/28/2017   TOTAL HIP ARTHROPLASTY Left 12/28/2017   Procedure: LEFT TOTAL HIP ARTHROPLASTY ANTERIOR APPROACH;  Surgeon: Mcarthur Rossetti, MD;  Location: [facility];  Service: Orthopedics;  Laterality: Left;   Patient Active Problem List   Diagnosis Date Noted   Unilateral primary osteoarthritis, left hip 12/28/2017   Status post total replacement of left hip 12/28/2017   Cholecystitis 05/23/2014   Acute cholecystitis     HYPERLIPIDEMIA-MIXED 07/17/2009   HYPERTENSION, BENIGN 07/17/2009   SYNCOPE AND COLLAPSE 07/17/2009   EDEMA 07/17/2009    REFERRING DIAG: REFERRING DIAG: S16.1XXA (ICD-10-CM) - Strain of muscle, fascia and tendon at neck level, initial encounter S16.1XXA (ICD-10-CM) - Strain of neck  THERAPY DIAG:  Cervicalgia  Abnormal posture  Rationale for Evaluation and Treatment Rehabilitation  PERTINENT HISTORY: Hx lacunar infarct per pt, HLD, HTN, previous L THA  PRECAUTIONS: none  SUBJECTIVE:  Pt denies any soreness after last session, states overall she has been doing well but last night 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  6/10 at present, no headache at present. Reports good compliance with HEP    PAIN:  Are you having pain: yes, 6/10 NPS Location: mid cervical, denies any headaches in the past week until last night How would you describe your pain? 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 Vertebral body heights are normal. Anterior spurring from C3-C4 through C7-T1, mild associated disc space narrowing most prominent at C4-C5. Mild-to-moderate multilevel facet hypertrophy. Limited assessment of left bony neural foramina due to positioning. No evidence of fracture. No prevertebral soft tissue thickening.   IMPRESSION: 1. No acute radiographic findings. 2. Mild multilevel degenerative change. 3. Trace retrolisthesis of C4  on C5.   PATIENT SURVEYS:  FOTO 51     COGNITION: Overall cognitive status: Within functional limits for tasks assessed     SENSATION: WFL BUE   POSTURE:  forward head posture, elevated UTs  (L more prominent than R)   PALPATION: Tenderness to palpation B UT/LS, significant relief with STM and improvement in headache. Most tender upper paracervical musculature, suboccipitals, improves with STM. Reports nonpainful stretch with cervical distraction, also non-relieving.      CERVICAL ROM:    Active ROM A/PROM (deg) eval AROM 10/07/21  Flexion 100% p!   Extension 100% p!   Right rotation 50 p! 50  Left rotation 38 p! 47   (Blank rows = not tested) Comments: R rotation more painful than L, all other directions roughly equivalent and provoke mid cervical pain   UPPER EXTREMITY ROM:   Active ROM Right eval Left eval  Shoulder flexion [facility]  Shoulder abduction [facility] WFL  Elbow flexion [facility] WFL  Elbow extension WFL WFL   (Blank rows = not tested) Comments: does exhibit neck pain/headache with movement but no mobility limitations   UPPER EXTREMITY MMT:   MMT Right eval Left eval  Shoulder flexion 4- 4-  Shoulder internal rotation 4 4  Shoulder external rotation 4 4   (Blank rows = not tested)   Comments: nonpainful "strain" in upper back with shoulder flex     TODAY'S TREATMENT: [facility] Adult PT Treatment:                                                DATE: 10/15/21 Therapeutic Exercise: Swiss ball rollout + push down x10 4 way cervical isos 2x8 each, cues for appropriate force and form Shoulder shrugs, 2x8 5# DB each UE, cues for form and eccentric emphasis  Chin tuck, standing at wall with ball, 3x10 for improved DNF muscular endurance/activity tolerance Manual Therapy: Supine - STM to paracervical musculature, upper traps, levator scap. gentle cervical distraction and suboccipital release  [facility] Adult PT Treatment:                                                 DATE: 10/09/2021  Therapeutic Exercise: Swiss ball roll out fwd only, x20 reps, standing, for improved cervicothoracic extension, UE push down at end range second set B ER + scap retraction, RTB, 2x15, cues for upright posture and form Cervical lateral flexion isos 3x8 3 sec hold, cues for posture and pacing RTB rows 2x12, cues for posture and pacing, reduced UT compensations Manual Therapy: Supine - STM to paracervical musculature, upper traps, levator scap. Increased time spent with gentle cervical distraction and suboccipital release Neuromuscular re-ed: [place] scaption to 90 B with red theraband around wrists  3x8 to increase periscapular/GH activation/stability, cues for form and pacing Shoulder shrugs, 3x8 3# DB each UE, for improved motor control of UT, cues for appropriate form, emphasis on eccentric portion and breath control Chin tuck, standing at wall with ball, 3x8 for improved DNF activation, cues to minimize compensations      PATIENT EDUCATION:  Education details: rationale for interventions throughout, education on PT progression Person educated: Patient Education method: Explanation, Demonstration, Tactile cues, Verbal cues Education comprehension: verbalized understanding, returned demonstration, verbal cues required, tactile cues required, and needs further education      HOME EXERCISE PROGRAM: Access Code: [email or web address] URL: [email or web address] Date: 10/09/2021 Prepared by: Enis Slipper  Exercises - Seated Shoulder Shrugs  - 1 x daily - 7 x weekly - 3 sets - 10 reps - Shoulder External Rotation and Scapular Retraction with Resistance  - 1 x daily - 7 x weekly - 3 sets - 10 reps - Standing Isometric Cervical Sidebending with Manual Resistance  - 1 x daily - 7 x weekly - 3 sets - 8 reps - 3sec hold   ASSESSMENT:   CLINICAL IMPRESSION: Pt is a 66 y.o woman arriving to PT on this date for follow up w/ neck pain and headaches.  Today's session continues to  emphasize periscapular/GH stability and activation exercises, increased time spent with manual at end of session given increased symptoms on arrival. Some limitations in progression on this date as pt arrives with increased symptoms that she attributes to patient care last night, but continues to session relatively well and reports improved symptoms with activity. Able to incorporate increased volume for cervical isometrics with good tolerance. Pt continues to demonstrate reduced cervicothoracic mobility and GH/periscapular weakness, postural deviations. Pt  reports improved pain on departure (3/10 NPS), tolerates session well overall with modifications.   Recommend skilled PT to address aforementioned deficits to maximize functional independence and tolerance. Pt departs today's session in no acute distress, all voiced questions/concerns addressed appropriately from PT perspective.       OBJECTIVE IMPAIRMENTS decreased activity tolerance, decreased endurance, decreased mobility, decreased ROM, decreased strength, impaired flexibility, and pain.    ACTIVITY LIMITATIONS carrying, lifting, bending, sitting, and caring for others   PARTICIPATION LIMITATIONS: community activity and occupation   [place] are also affecting patient's functional outcome.    REHAB POTENTIAL: Good   CLINICAL DECISION MAKING: Stable/uncomplicated   EVALUATION COMPLEXITY: Low     GOALS: Goals reviewed with patient? Yes   SHORT TERM GOALS: Target date: 10/24/2021    Pt will score at least 58 on FOTO in order to demonstrate improved perception of function due to symptoms and increased participation in work activities.  Baseline: 51 Goal status: INITIAL   Pt will demonstrate appropriate understanding and performance of initially prescribed HEP in order to facilitate improved independence with management of symptoms.  Baseline: HEP provided on eval Goal status: INITIAL    2. Pt will report  improved pain of 3/10 on NPS while performing work duties such as sitting for documentation/chart review.             Baseline: 5-6/10 NPS            Goal status: INITIAL       LONG TERM GOALS: Target date: 11/21/2021   Pt will score 65 on FOTO in order to demonstrate improved perception of function d/t neck pain. Baseline: 51 Goal status: INITIAL   2.  Pt will demonstrate at least 65 degrees of active cervical rotation  in order to maximize cervical mobility for environmental awareness/safety in work setting. Baseline: 50 deg R rotation, 38 deg L rotation Goal status: INITIAL   3.  Pt will demonstrate at least 4+/5 shoulder flexion MMT for improved symmetry of UE strength and improved tolerance to functional movements.  Baseline: 4- Goal status: INITIAL        PLAN: PT FREQUENCY: 1-2x/week (plan for 2x/week first 4 weeks, followed by 1x/week for 4 weeks)   PT DURATION: 8 weeks (12 visits total)   PLANNED INTERVENTIONS: Therapeutic exercises, Therapeutic activity, Neuromuscular re-education, Balance training, Gait training, Patient/Family education, Self Care, Joint mobilization, Joint manipulation, Stair training, Aquatic Therapy, Dry Needling, Electrical stimulation, Cryotherapy, Moist heat, Manual therapy, and Re-evaluation   PLAN FOR NEXT SESSION: Review/update HEP. Increase emphasis on periscapular/cervical stability and strengthening    Leeroy Cha PT, DPT 10/15/2021 4:47 PM

## 2021-10-15 ENCOUNTER — Encounter: Payer: Self-pay | Admitting: Physical Therapy

## 2021-10-15 ENCOUNTER — Ambulatory Visit: Payer: 59 | Attending: Nurse Practitioner | Admitting: Physical Therapy

## 2021-10-15 DIAGNOSIS — R293 Abnormal posture: Secondary | ICD-10-CM | POA: Diagnosis not present

## 2021-10-15 DIAGNOSIS — M542 Cervicalgia: Secondary | ICD-10-CM | POA: Insufficient documentation

## 2021-10-15 NOTE — Therapy (Signed)
OUTPATIENT PHYSICAL THERAPY TREATMENT NOTE   Patient Name: Theresa Nolan MRN: [id number] DOB:Jul 04, 1955, 66 y.o., female Today's Date: 10/16/2021  PCP: Kelton Pillar MD   REFERRING PROVIDER: Drucilla Chalet NP  END OF SESSION:   PT End of Session - 10/16/21 1016     Visit Number 6    Number of Visits 12    Date for PT Re-Evaluation 11/21/21    Authorization Type Workers comp    Authorization - Visit Number 6    Authorization - Number of Visits 12    PT Start Time 1016    PT Stop Time 1056    PT Time Calculation (min) 40 min    Activity Tolerance Patient tolerated treatment well    Behavior During Therapy WFL for tasks assessed/performed                 Past Medical History:  Diagnosis Date   Chronic back pain    Degenerative joint disease    GERD (gastroesophageal reflux disease)    Hypercholesteremia    Hypertension    Hypothyroidism    Lacunar infarction ([place])    Stroke ([place])    TSH elevation    Past Surgical History:  Procedure Laterality Date   ABDOMINAL HYSTERECTOMY     ovaries remain    APPENDECTOMY  1989-1990   CARDIAC CATHETERIZATION     10/08/04: 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 NL LVF. EF 60%.   CHOLECYSTECTOMY N/A 05/24/2014   Procedure: LAPAROSCOPIC CHOLECYSTECTOMY WITH INTRAOPERATIVE CHOLANGIOGRAM;  Surgeon: Coralie Keens, MD;  Location: [place];  Service: General;  Laterality: N/A;   HERNIA REPAIR     umbilical hernia repair as a child   JOINT REPLACEMENT     TONSILLECTOMY     TOTAL HIP ARTHROPLASTY Left 12/28/2017   TOTAL HIP ARTHROPLASTY Left 12/28/2017   Procedure: LEFT TOTAL HIP ARTHROPLASTY ANTERIOR APPROACH;  Surgeon: Mcarthur Rossetti, MD;  Location: [place];  Service: Orthopedics;  Laterality: Left;   Patient Active Problem List   Diagnosis Date Noted   Unilateral primary osteoarthritis, left hip 12/28/2017   Status post total replacement of left hip 12/28/2017   Cholecystitis 05/23/2014   Acute cholecystitis     HYPERLIPIDEMIA-MIXED 07/17/2009   HYPERTENSION, BENIGN 07/17/2009   SYNCOPE AND COLLAPSE 07/17/2009   EDEMA 07/17/2009    REFERRING DIAG: REFERRING DIAG: S16.1XXA (ICD-10-CM) - Strain of muscle, fascia and tendon at neck level, initial encounter S16.1XXA (ICD-10-CM) - Strain of neck  THERAPY DIAG:  Cervicalgia  Abnormal posture  Rationale for Evaluation and Treatment Rehabilitation  PERTINENT HISTORY: Hx lacunar infarct per pt, HLD, HTN, previous L THA  PRECAUTIONS: none  SUBJECTIVE:  Pt arrives with "morning stiffness" but no pain or headache at present, denies soreness after yesterday's session. No other new updates, states she is pleased with progress thus far and feels she is able manage symptoms better than at start of therapy.     PAIN:  Are you having pain: 0/10 NPS Location: mid cervical, denies any headaches in the past week until last night How would you describe your pain? 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 Vertebral body heights are normal. Anterior spurring from C3-C4 through C7-T1, mild associated disc space narrowing most prominent at C4-C5. Mild-to-moderate multilevel facet hypertrophy. Limited assessment of left bony neural foramina due to positioning. No evidence of fracture. No prevertebral soft tissue thickening.   IMPRESSION: 1. No acute radiographic findings. 2. Mild multilevel degenerative change. 3. Trace retrolisthesis of C4 on C5.   PATIENT SURVEYS:  FOTO 51 FOTO 10/16/21:       COGNITION: Overall cognitive status: Within functional limits for tasks assessed     SENSATION: WFL BUE   POSTURE:  forward head posture, elevated UTs  (L more prominent than R)   PALPATION: Tenderness to palpation B UT/LS, significant relief with STM and improvement in headache. Most tender upper paracervical musculature, suboccipitals, improves with STM. Reports nonpainful stretch with cervical distraction, also non-relieving.      CERVICAL ROM:    Active ROM A/PROM (deg) eval AROM 10/07/21 AROM 10/16/21  Flexion 100% p!    Extension 100% p!    Right rotation 50 p! 50 56 painless, mild stiffness  Left rotation 38 p! 47 55 painless   (Blank rows = not tested) Comments: R rotation more painful than L, all other directions roughly equivalent and provoke mid cervical pain   UPPER EXTREMITY ROM:   Active ROM Right eval Left eval  Shoulder flexion [facility]  Shoulder abduction [facility] WFL  Elbow flexion [facility] WFL  Elbow extension WFL WFL   (Blank rows = not tested) Comments: does exhibit neck pain/headache with movement but no mobility limitations   UPPER EXTREMITY MMT:   MMT Right eval Left eval  Shoulder flexion 4- 4-  Shoulder internal rotation 4 4  Shoulder external rotation 4 4   (Blank rows = not tested)   Comments: nonpainful "strain" in upper back with shoulder flex     TODAY'S TREATMENT: [facility] Adult PT Treatment:                                                DATE: 10/16/21 Therapeutic Exercise: Swiss ball up wall with gentle push x10 Chin tuck at wall with ball x30 Cable column rows 7#B 3x6 B scaption with RTB around wrists 2x10 B ER + scap retraction GTB 3x8 cues for cervical positioning  Modified plank on counter 3x30sec cues for hip positioning and alignment DB scaption 3# 2x5 unilat performance but performed B UE, cues for comfortable ROM    [facility] Adult PT Treatment:                                                DATE: 10/15/21 Therapeutic Exercise: Swiss  ball rollout + push down x10 4 way cervical isos 2x8 each, cues for appropriate force and form Shoulder shrugs, 2x8 5# DB each UE, cues for form and eccentric emphasis  Chin tuck, standing at wall with ball, 3x10 for improved DNF muscular endurance/activity tolerance Manual Therapy: Supine - STM to paracervical musculature, upper traps, levator scap. gentle cervical distraction and suboccipital release     PATIENT EDUCATION:  Education details: rationale for interventions throughout, education on PT progression Person educated: Patient Education method: Explanation, Demonstration, Tactile cues, Verbal cues Education comprehension: verbalized  understanding, returned demonstration, verbal cues required, tactile cues required, and needs further education      HOME EXERCISE PROGRAM: Access Code: [email or web address] URL: [email or web address] Date: 10/09/2021 Prepared by: Enis Slipper  Exercises - Seated Shoulder Shrugs  - 1 x daily - 7 x weekly - 3 sets - 10 reps - Shoulder External Rotation and Scapular Retraction with Resistance  - 1 x daily - 7 x weekly - 3 sets - 10 reps - Standing Isometric Cervical Sidebending with Manual Resistance  - 1 x daily - 7 x weekly - 3 sets - 8 reps - 3sec hold   ASSESSMENT:   CLINICAL IMPRESSION: Pt is a 66 y.o woman arriving to PT on this date for follow up w/ neck pain and headaches.Today's session emphasizing periscapular stability and strengthening, able to progress well for increased resistance and complexity of exercise and reduced need for cueing with familiar exercises. Pt tolerates session well overall with intermittent rest breaks. Cervical ROM at beginning of session noted with marked improvements compared to previous sessions as described above, pt notes no pain with movement today, just mild stiffness. Pt continues to demonstrate limitations in muscular endurance, cervicothoracic mobility, and GH/periscapular strength. Departs with report of  muscular fatigue but no pain.   Recommend skilled PT to address aforementioned deficits to maximize functional independence and tolerance. Pt departs today's session in no acute distress, all voiced questions/concerns addressed appropriately from PT perspective.       OBJECTIVE IMPAIRMENTS decreased activity tolerance, decreased endurance, decreased mobility, decreased ROM, decreased strength, impaired flexibility, and pain.    ACTIVITY LIMITATIONS carrying, lifting, bending, sitting, and caring for others   PARTICIPATION LIMITATIONS: community activity and occupation   [place] are also affecting patient's functional outcome.    REHAB POTENTIAL: Good   CLINICAL DECISION MAKING: Stable/uncomplicated   EVALUATION COMPLEXITY: Low     GOALS: Goals reviewed with patient? Yes   SHORT TERM GOALS: Target date: 10/24/2021    Pt will score at least 58 on FOTO in order to demonstrate improved perception of function due to symptoms and increased participation in work activities.  Baseline: 51 Goal status: INITIAL   Pt will demonstrate appropriate understanding and performance of initially prescribed HEP in order to facilitate improved independence with management of symptoms.  Baseline: HEP provided on eval Goal status: INITIAL    2. Pt will report improved pain of 3/10 on NPS while performing work duties such as sitting for documentation/chart review.             Baseline: 5-6/10 NPS            Goal status: INITIAL       LONG TERM GOALS: Target date: 11/21/2021   Pt will score 65 on FOTO in order to demonstrate improved perception of function d/t neck pain. Baseline: 51 Goal status: INITIAL   2.  Pt will demonstrate at least 65 degrees of active cervical rotation in order to maximize cervical mobility for environmental awareness/safety in work setting. Baseline: 50 deg R rotation, 38 deg L rotation Goal status: INITIAL   3.  Pt will demonstrate at least 4+/5  shoulder flexion MMT for improved symmetry of UE strength and improved tolerance to functional movements.  Baseline: 4- Goal status: INITIAL        PLAN: PT FREQUENCY: 1-2x/week (plan for 2x/week first 4 weeks, followed by 1x/week for 4 weeks)   PT DURATION: 8 weeks (12 visits total)   PLANNED INTERVENTIONS: Therapeutic exercises, Therapeutic activity,  Neuromuscular re-education, Balance training, Gait training, Patient/Family education, Self Care, Joint mobilization, Joint manipulation, Stair training, Aquatic Therapy, Dry Needling, Electrical stimulation, Cryotherapy, Moist heat, Manual therapy, and Re-evaluation   PLAN FOR NEXT SESSION: Review/update HEP. Administer FOTO. Progress strengthening and wean manual, begin to incorporate more functional strengthening/lifting    Leeroy Cha PT, DPT 10/16/2021 10:58 AM

## 2021-10-16 ENCOUNTER — Encounter: Payer: Self-pay | Admitting: Physical Therapy

## 2021-10-16 ENCOUNTER — Ambulatory Visit: Payer: PRIVATE HEALTH INSURANCE | Admitting: Physical Therapy

## 2021-10-16 DIAGNOSIS — R293 Abnormal posture: Secondary | ICD-10-CM

## 2021-10-16 DIAGNOSIS — M542 Cervicalgia: Secondary | ICD-10-CM

## 2021-10-20 NOTE — Therapy (Signed)
OUTPATIENT PHYSICAL THERAPY TREATMENT NOTE   Patient Name: Theresa Nolan MRN: [id number] DOB:Dec 24, 1955, 66 y.o., female Today's Date: 10/21/2021  PCP: Kelton Pillar MD   REFERRING PROVIDER: Drucilla Chalet NP  END OF SESSION:   PT End of Session - 10/21/21 0928     Visit Number 7    Number of Visits 12    Date for PT Re-Evaluation 11/21/21    Authorization Type Workers comp    Authorization - Visit Number 7    Authorization - Number of Visits 12    PT Start Time [phone]    PT Stop Time 1008    PT Time Calculation (min) 40 min    Activity Tolerance Patient tolerated treatment well    Behavior During Therapy WFL for tasks assessed/performed                  Past Medical History:  Diagnosis Date   Chronic back pain    Degenerative joint disease    GERD (gastroesophageal reflux disease)    Hypercholesteremia    Hypertension    Hypothyroidism    Lacunar infarction ([place])    Stroke ([place])    TSH elevation    Past Surgical History:  Procedure Laterality Date   ABDOMINAL HYSTERECTOMY     ovaries remain    APPENDECTOMY  1989-1990   CARDIAC CATHETERIZATION     10/08/04: 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 NL LVF. EF 60%.   CHOLECYSTECTOMY N/A 05/24/2014   Procedure: LAPAROSCOPIC CHOLECYSTECTOMY WITH INTRAOPERATIVE CHOLANGIOGRAM;  Surgeon: Coralie Keens, MD;  Location: [facility];  Service: General;  Laterality: N/A;   HERNIA REPAIR     umbilical hernia repair as a child   JOINT REPLACEMENT     TONSILLECTOMY     TOTAL HIP ARTHROPLASTY Left 12/28/2017   TOTAL HIP ARTHROPLASTY Left 12/28/2017   Procedure: LEFT TOTAL HIP ARTHROPLASTY ANTERIOR APPROACH;  Surgeon: Mcarthur Rossetti, MD;  Location: [facility];  Service: Orthopedics;  Laterality: Left;   Patient Active Problem List   Diagnosis Date Noted   Unilateral primary osteoarthritis, left hip 12/28/2017   Status post total replacement of left hip 12/28/2017   Cholecystitis 05/23/2014   Acute  cholecystitis    HYPERLIPIDEMIA-MIXED 07/17/2009   HYPERTENSION, BENIGN 07/17/2009   SYNCOPE AND COLLAPSE 07/17/2009   EDEMA 07/17/2009    REFERRING DIAG: REFERRING DIAG: S16.1XXA (ICD-10-CM) - Strain of muscle, fascia and tendon at neck level, initial encounter S16.1XXA (ICD-10-CM) - Strain of neck  THERAPY DIAG:  Cervicalgia  Abnormal posture  Rationale for Evaluation and Treatment Rehabilitation  PERTINENT HISTORY: Hx lacunar infarct per pt, HLD, HTN, previous L THA  PRECAUTIONS: none  SUBJECTIVE:  Pt arrives with report of 5/10 pain on NPS, states she feels she had increased soreness after therapy on consecutive days and work activities. Otherwise no new complaints.     PAIN:  Are you having pain: 0/10 NPS Location: mid cervical, reports 1 headache in past week (present on arrival but resolves as session progresses) How would you describe your pain? 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 Vertebral body heights  are normal. Anterior spurring from C3-C4 through C7-T1, mild associated disc space narrowing most prominent at C4-C5. Mild-to-moderate multilevel facet hypertrophy. Limited assessment of left bony neural foramina due to positioning. No evidence of fracture. No prevertebral soft tissue thickening.   IMPRESSION: 1. No acute radiographic findings. 2. Mild multilevel degenerative change. 3. Trace retrolisthesis of C4 on C5.   PATIENT SURVEYS:  FOTO [place] 10/21/21:      COGNITION: Overall cognitive status: Within  functional limits for tasks assessed     SENSATION: WFL BUE   POSTURE:  forward head posture, elevated UTs  (L more prominent than R)   PALPATION: Tenderness to palpation B UT/LS, significant relief with STM and improvement in headache. Most tender upper paracervical musculature, suboccipitals, improves with STM. Reports nonpainful stretch with cervical distraction, also non-relieving.      CERVICAL ROM:    Active ROM A/PROM (deg) eval AROM 10/07/21 AROM 10/16/21  Flexion 100% p!    Extension 100% p!    Right rotation 50 p! 50 56 painless, mild stiffness  Left rotation 38 p! 47 55 painless   (Blank rows = not tested) Comments: R rotation more painful than L, all other directions roughly equivalent and provoke mid cervical pain   UPPER EXTREMITY ROM:   Active ROM Right eval Left eval  Shoulder flexion [facility]  Shoulder abduction [facility] WFL  Elbow flexion [facility] WFL  Elbow extension WFL WFL   (Blank rows = not tested) Comments: does exhibit neck pain/headache with movement but no mobility limitations   UPPER EXTREMITY MMT:   MMT Right eval Left eval  Shoulder flexion 4- 4-  Shoulder internal rotation 4 4  Shoulder external rotation 4 4   (Blank rows = not tested)   Comments: nonpainful "strain" in upper back with shoulder flex     TODAY'S TREATMENT: [facility] Adult PT Treatment:                                                DATE: 10/21/21 Therapeutic Exercise: Swiss ball up wall with gentle push x12 Chin tuck at wall with ball 3x10 GTB rows 3x10 GTB shoulder ext 3x10 Shoulder shrugs no weight 2x15 Manual Therapy: Supine - STM to paracervical musculature, upper traps, levator scap. gentle cervical distraction and suboccipital release    [facility] Adult PT Treatment:                                                DATE: 10/16/21 Therapeutic Exercise: Swiss ball up wall with gentle push x10 Chin tuck at wall with ball x30 Cable column rows 7#B 3x6 B scaption with RTB around  wrists 2x10 B ER + scap retraction GTB 3x8 cues for cervical positioning  Modified plank on counter 3x30sec cues for hip positioning and alignment DB scaption 3# 2x5 unilat performance but performed B UE, cues for comfortable ROM    [facility] Adult PT Treatment:                                                DATE: 10/15/21 Therapeutic Exercise: Swiss ball  rollout + push down x10 4 way cervical isos 2x8 each, cues for appropriate force and form Shoulder shrugs, 2x8 5# DB each UE, cues for form and eccentric emphasis  Chin tuck, standing at wall with ball, 3x10 for improved DNF muscular endurance/activity tolerance Manual Therapy: Supine - STM to paracervical musculature, upper traps, levator scap. gentle cervical distraction and suboccipital release     PATIENT EDUCATION:  Education details: rationale for interventions throughout, education on PT progression Person educated: Patient Education method: Explanation, Demonstration, Tactile cues, Verbal cues Education comprehension: verbalized understanding, returned demonstration, verbal cues required, tactile cues required, and needs further education      HOME EXERCISE PROGRAM: Access Code: [email or web address] URL: [email or web address] Date: 10/09/2021 Prepared by: Enis Slipper  Exercises - Seated Shoulder Shrugs  - 1 x daily - 7 x weekly - 3 sets - 10 reps - Shoulder External Rotation and Scapular Retraction with Resistance  - 1 x daily - 7 x weekly - 3 sets - 10 reps - Standing Isometric Cervical Sidebending with Manual Resistance  - 1 x daily - 7 x weekly - 3 sets - 8 reps - 3sec hold   ASSESSMENT:   CLINICAL IMPRESSION: Pt is a 66 y.o woman arriving to PT on this date for follow up w/ neck pain and headaches. Today's progression limited as pt reports continued soreness that she attributes to consecutive therapy days last week. Pt continues to tolerate familiar program with increased rest breaks on this date, cues for setup and  reduced compensations. Pt reports gradual improvement in symptoms as session progresses, most relief from manual therapy at end of session with reduction in pain from 5/10 on NPS on arrival to 3/10 on departure.   Recommend skilled PT to address aforementioned deficits to maximize functional independence and tolerance. Pt departs today's session in no acute distress, all voiced questions/concerns addressed appropriately from PT perspective.       OBJECTIVE IMPAIRMENTS decreased activity tolerance, decreased endurance, decreased mobility, decreased ROM, decreased strength, impaired flexibility, and pain.    ACTIVITY LIMITATIONS carrying, lifting, bending, sitting, and caring for others   PARTICIPATION LIMITATIONS: community activity and occupation   [place] are also affecting patient's functional outcome.    REHAB POTENTIAL: Good   CLINICAL DECISION MAKING: Stable/uncomplicated   EVALUATION COMPLEXITY: Low     GOALS: Goals reviewed with patient? Yes   SHORT TERM GOALS: Target date: 10/24/2021    Pt will score at least 58 on FOTO in order to demonstrate improved perception of function due to symptoms and increased participation in work activities.  Baseline: 51 10/21/21: 55 Goal status: progressing   Pt will demonstrate appropriate understanding and performance of initially prescribed HEP in order to facilitate improved independence with management of symptoms.  Baseline: HEP provided on eval 10/21/21: independent Goal status: MET   2. Pt will report improved pain of 3/10 on NPS while performing work duties such as sitting for documentation/chart review.             Baseline: 5-6/10 NPS  10/21/21: 3/10 NPS during documentation            Goal status: MET       LONG TERM GOALS: Target date: 11/21/2021   Pt will score 65 on FOTO in order to demonstrate improved perception of function d/t neck pain. Baseline: 51 10/21/21: 55 Goal status: ongoing   2.  Pt will  demonstrate at least 65 degrees of active cervical rotation in order to maximize cervical  mobility for environmental awareness/safety in work setting. Baseline: 50 deg R rotation, 38 deg L rotation Goal status: INITIAL   3.  Pt will demonstrate at least 4+/5 shoulder flexion MMT for improved symmetry of UE strength and improved tolerance to functional movements.  Baseline: 4- Goal status: INITIAL        PLAN: PT FREQUENCY: 1-2x/week (plan for 2x/week first 4 weeks, followed by 1x/week for 4 weeks)   PT DURATION: 8 weeks (12 visits total)   PLANNED INTERVENTIONS: Therapeutic exercises, Therapeutic activity, Neuromuscular re-education, Balance training, Gait training, Patient/Family education, Self Care, Joint mobilization, Joint manipulation, Stair training, Aquatic Therapy, Dry Needling, Electrical stimulation, Cryotherapy, Moist heat, Manual therapy, and Re-evaluation   PLAN FOR NEXT SESSION: progress as able/appropriate, wean manual as able. Transition to 1x/week per initial POC    Leeroy Cha PT, DPT 10/21/2021 10:11 AM

## 2021-10-21 ENCOUNTER — Ambulatory Visit: Payer: PRIVATE HEALTH INSURANCE | Attending: Family Medicine | Admitting: Physical Therapy

## 2021-10-21 ENCOUNTER — Encounter: Payer: Self-pay | Admitting: Physical Therapy

## 2021-10-21 DIAGNOSIS — M542 Cervicalgia: Secondary | ICD-10-CM | POA: Insufficient documentation

## 2021-10-21 DIAGNOSIS — R293 Abnormal posture: Secondary | ICD-10-CM | POA: Insufficient documentation

## 2021-10-22 NOTE — Therapy (Signed)
OUTPATIENT PHYSICAL THERAPY TREATMENT NOTE   Patient Name: Theresa Nolan MRN: [id number] DOB:06/11/1955, 66 y.o., female Today's Date: 10/23/2021  PCP: Kelton Pillar MD   REFERRING PROVIDER: Drucilla Chalet NP  END OF SESSION:   PT End of Session - 10/23/21 0925     Visit Number 8    Number of Visits 12    Date for PT Re-Evaluation 11/21/21    Authorization Type Workers comp    Authorization - Visit Number 8    Authorization - Number of Visits 12    PT Start Time [phone]    PT Stop Time 1010    PT Time Calculation (min) 45 min    Activity Tolerance Patient tolerated treatment well    Behavior During Therapy WFL for tasks assessed/performed                   Past Medical History:  Diagnosis Date   Chronic back pain    Degenerative joint disease    GERD (gastroesophageal reflux disease)    Hypercholesteremia    Hypertension    Hypothyroidism    Lacunar infarction ([place])    Stroke ([place])    TSH elevation    Past Surgical History:  Procedure Laterality Date   ABDOMINAL HYSTERECTOMY     ovaries remain    APPENDECTOMY  1989-1990   CARDIAC CATHETERIZATION     10/08/04: 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 NL LVF. EF 60%.   CHOLECYSTECTOMY N/A 05/24/2014   Procedure: LAPAROSCOPIC CHOLECYSTECTOMY WITH INTRAOPERATIVE CHOLANGIOGRAM;  Surgeon: Coralie Keens, MD;  Location: [place];  Service: General;  Laterality: N/A;   HERNIA REPAIR     umbilical hernia repair as a child   JOINT REPLACEMENT     TONSILLECTOMY     TOTAL HIP ARTHROPLASTY Left 12/28/2017   TOTAL HIP ARTHROPLASTY Left 12/28/2017   Procedure: LEFT TOTAL HIP ARTHROPLASTY ANTERIOR APPROACH;  Surgeon: Mcarthur Rossetti, MD;  Location: [facility];  Service: Orthopedics;  Laterality: Left;   Patient Active Problem List   Diagnosis Date Noted   Unilateral primary osteoarthritis, left hip 12/28/2017   Status post total replacement of left hip 12/28/2017   Cholecystitis 05/23/2014   Acute  cholecystitis    HYPERLIPIDEMIA-MIXED 07/17/2009   HYPERTENSION, BENIGN 07/17/2009   SYNCOPE AND COLLAPSE 07/17/2009   EDEMA 07/17/2009    REFERRING DIAG: REFERRING DIAG: S16.1XXA (ICD-10-CM) - Strain of muscle, fascia and tendon at neck level, initial encounter S16.1XXA (ICD-10-CM) - Strain of neck  THERAPY DIAG:  Cervicalgia  Abnormal posture  Rationale for Evaluation and Treatment Rehabilitation  PERTINENT HISTORY: Hx lacunar infarct per pt, HLD, HTN, previous L THA  PRECAUTIONS: none  SUBJECTIVE:  Pt arrives with 3/10 pain on NPS, denies significant soreness after last session.     PAIN:  Are you having pain: 3/10 NPS Location: mid cervical, reports 1 headache in past week (present on arrival but resolves as session progresses) How would you describe your pain? 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 Vertebral body heights are normal. Anterior spurring from C3-C4 through C7-T1, mild associated disc space narrowing most  prominent at C4-C5. Mild-to-moderate multilevel facet hypertrophy. Limited assessment of left bony neural foramina due to positioning. No evidence of fracture. No prevertebral soft tissue thickening.   IMPRESSION: 1. No acute radiographic findings. 2. Mild multilevel degenerative change. 3. Trace retrolisthesis of C4 on C5.   PATIENT SURVEYS:  [place] 10/21/21: 58     COGNITION: Overall cognitive status: Within functional limits for tasks assessed     SENSATION: WFL BUE   POSTURE:  forward head  posture, elevated UTs  (L more prominent than R)   PALPATION: Tenderness to palpation B UT/LS, significant relief with STM and improvement in headache. Most tender upper paracervical musculature, suboccipitals, improves with STM. Reports nonpainful stretch with cervical distraction, also non-relieving.      CERVICAL ROM:    Active ROM A/PROM (deg) eval AROM 10/07/21 AROM 10/16/21 AROM 10/23/21  10/23/21 post treatment AROM  Flexion 100% p!   100% no pain   Extension 100% p!   100% p!   Right rotation 50 p! 50 56 painless, mild stiffness 50 mildly stiff, no pain 62, painless  Left rotation 38 p! 47 55 painless 56, painless 60, painless    (Blank rows = not tested) Comments: R rotation more painful than L, all other directions roughly equivalent and provoke mid cervical pain   UPPER EXTREMITY ROM:   Active ROM Right eval Left eval  Shoulder flexion [facility]  Shoulder abduction [facility] WFL  Elbow flexion [facility] WFL  Elbow extension WFL WFL   (Blank rows = not tested) Comments: does exhibit neck pain/headache with movement but no mobility limitations   UPPER EXTREMITY MMT:   MMT Right eval Left eval  Shoulder flexion 4- 4-  Shoulder internal rotation 4 4  Shoulder external rotation 4 4   (Blank rows = not tested)   Comments: nonpainful "strain" in upper back with shoulder flex     TODAY'S TREATMENT: [facility] Adult PT Treatment:                                                DATE: 10/23/21 Therapeutic Exercise: Swiss ball up wall with gentle push and lift off x10 Chin tuck at wall with ball 2x10 + B scaption to 90 deg Cervical sidebending isos x10 flex/ext/sidebending BTB rows 3x8, cues for setup and positioning BTB shoulder ext 2x8 Shoulder shrugs 4# 2x8 Manual Therapy: Supine - STM to paracervical musculature, upper traps, levator scap. gentle cervical distraction and suboccipital release Contract relax cervical rotation 2x5 B    [facility] Adult PT Treatment:                                                 DATE: 10/21/21 Therapeutic Exercise: Swiss ball up wall with gentle push x12 Chin tuck at wall with ball 3x10 GTB rows 3x10 GTB shoulder ext 3x10 Shoulder shrugs no weight 2x15 Manual Therapy: Supine - STM to paracervical musculature, upper traps, levator scap. gentle cervical distraction and suboccipital release    [facility] Adult PT Treatment:  DATE: 10/16/21 Therapeutic Exercise: Swiss ball up wall with gentle push x10 Chin tuck at wall with ball x30 Cable column rows 7#B 3x6 B scaption with RTB around wrists 2x10 B ER + scap retraction GTB 3x8 cues for cervical positioning  Modified plank on counter 3x30sec cues for hip positioning and alignment DB scaption 3# 2x5 unilat performance but performed B UE, cues for comfortable ROM     PATIENT EDUCATION:  Education details: rationale for interventions throughout, education on PT progression Person educated: Patient Education method: Explanation, Demonstration, Tactile cues, Verbal cues Education comprehension: verbalized understanding, returned demonstration, verbal cues required, tactile cues required, and needs further education      HOME EXERCISE PROGRAM: Access Code: [email or web address] URL: [email or web address] Date: 10/09/2021 Prepared by: Enis Slipper  Exercises - Seated Shoulder Shrugs  - 1 x daily - 7 x weekly - 3 sets - 10 reps - Shoulder External Rotation and Scapular Retraction with Resistance  - 1 x daily - 7 x weekly - 3 sets - 10 reps - Standing Isometric Cervical Sidebending with Manual Resistance  - 1 x daily - 7 x weekly - 3 sets - 8 reps - 3sec hold   ASSESSMENT:   CLINICAL IMPRESSION: Pt is a 66 y.o woman arriving to PT on this date for follow up w/ neck pain and headaches. Pt arrives with improved pain compared to last session, primary complaint of fatigue. Able to progress for increased complexity of cervical/periscapular exercises today  with aim of integrating UE movement with cervical stability. Pt tolerates well although she does report increased perception of workload, no increase in pain. Ended session with manual as described above to mitigate post exercise soreness. Post session noted improvement in cervical rotation as described above, pt reports 2/10 pain on departure (3/10 on arrival).   Recommend skilled PT to address aforementioned deficits to maximize functional independence and tolerance. Pt departs today's session in no acute distress, all voiced questions/concerns addressed appropriately from PT perspective.       OBJECTIVE IMPAIRMENTS decreased activity tolerance, decreased endurance, decreased mobility, decreased ROM, decreased strength, impaired flexibility, and pain.    ACTIVITY LIMITATIONS carrying, lifting, bending, sitting, and caring for others   PARTICIPATION LIMITATIONS: community activity and occupation   [place] are also affecting patient's functional outcome.    REHAB POTENTIAL: Good   CLINICAL DECISION MAKING: Stable/uncomplicated   EVALUATION COMPLEXITY: Low     GOALS: Goals reviewed with patient? Yes   SHORT TERM GOALS: Target date: 10/24/2021    Pt will score at least 58 on FOTO in order to demonstrate improved perception of function due to symptoms and increased participation in work activities.  Baseline: 51 10/21/21: 55 Goal status: progressing   Pt will demonstrate appropriate understanding and performance of initially prescribed HEP in order to facilitate improved independence with management of symptoms.  Baseline: HEP provided on eval 10/21/21: independent Goal status: MET   2. Pt will report improved pain of 3/10 on NPS while performing work duties such as sitting for documentation/chart review.             Baseline: 5-6/10 NPS  10/21/21: 3/10 NPS during documentation            Goal status: MET       LONG TERM GOALS: Target date: 11/21/2021   Pt  will score 65 on FOTO in order to demonstrate improved perception of function d/t neck pain. Baseline: 51 10/21/21: 55 Goal status: ongoing   2.  Pt  will demonstrate at least 65 degrees of active cervical rotation in order to maximize cervical mobility for environmental awareness/safety in work setting. Baseline: 50 deg R rotation, 38 deg L rotation Goal status: INITIAL   3.  Pt will demonstrate at least 4+/5 shoulder flexion MMT for improved symmetry of UE strength and improved tolerance to functional movements.  Baseline: 4- Goal status: INITIAL        PLAN: PT FREQUENCY: 1-2x/week (plan for 2x/week first 4 weeks, followed by 1x/week for 4 weeks)   PT DURATION: 8 weeks (12 visits total)   PLANNED INTERVENTIONS: Therapeutic exercises, Therapeutic activity, Neuromuscular re-education, Balance training, Gait training, Patient/Family education, Self Care, Joint mobilization, Joint manipulation, Stair training, Aquatic Therapy, Dry Needling, Electrical stimulation, Cryotherapy, Moist heat, Manual therapy, and Re-evaluation   PLAN FOR NEXT SESSION: wean manual as able, progress strengthening/stability exercises as able/appropriate    Leeroy Cha PT, DPT 10/23/2021 10:14 AM

## 2021-10-23 ENCOUNTER — Ambulatory Visit: Payer: PRIVATE HEALTH INSURANCE | Admitting: Physical Therapy

## 2021-10-23 ENCOUNTER — Encounter: Payer: Self-pay | Admitting: Physical Therapy

## 2021-10-23 DIAGNOSIS — M542 Cervicalgia: Secondary | ICD-10-CM | POA: Diagnosis not present

## 2021-10-23 DIAGNOSIS — R293 Abnormal posture: Secondary | ICD-10-CM

## 2021-10-30 ENCOUNTER — Encounter: Payer: Self-pay | Admitting: Physical Therapy

## 2021-10-30 ENCOUNTER — Other Ambulatory Visit (HOSPITAL_COMMUNITY): Payer: Self-pay

## 2021-10-30 ENCOUNTER — Ambulatory Visit: Payer: PRIVATE HEALTH INSURANCE | Attending: Nurse Practitioner | Admitting: Physical Therapy

## 2021-10-30 DIAGNOSIS — M542 Cervicalgia: Secondary | ICD-10-CM | POA: Insufficient documentation

## 2021-10-30 DIAGNOSIS — R293 Abnormal posture: Secondary | ICD-10-CM | POA: Insufficient documentation

## 2021-10-30 MED ORDER — AMLODIPINE BESYLATE 10 MG PO TABS
10.0000 mg | ORAL_TABLET | Freq: Every day | ORAL | 1 refills | Status: DC
  Filled 2021-10-30: qty 90, 90d supply, fill #0
  Filled 2022-04-22 – 2022-05-06 (×2): qty 90, 90d supply, fill #1

## 2021-10-30 MED ORDER — CYCLOBENZAPRINE HCL 10 MG PO TABS
10.0000 mg | ORAL_TABLET | Freq: Every evening | ORAL | 0 refills | Status: DC | PRN
  Filled 2021-10-30: qty 30, 30d supply, fill #0

## 2021-10-30 NOTE — Therapy (Signed)
OUTPATIENT PHYSICAL THERAPY TREATMENT NOTE   Patient Name: Theresa Nolan MRN: [id number] DOB:January 18, 1956, 66 y.o., female Today's Date: 10/30/2021  PCP: Kelton Pillar MD   REFERRING PROVIDER: Drucilla Chalet NP  END OF SESSION:   PT End of Session - 10/30/21 0932     Visit Number 9    Number of Visits 12    Date for PT Re-Evaluation 11/21/21    Authorization Type Workers comp    Authorization - Visit Number 9    Authorization - Number of Visits 12    PT Start Time 0932    PT Stop Time 1012    PT Time Calculation (min) 40 min    Activity Tolerance Patient tolerated treatment well    Behavior During Therapy WFL for tasks assessed/performed                   Past Medical History:  Diagnosis Date   Chronic back pain    Degenerative joint disease    GERD (gastroesophageal reflux disease)    Hypercholesteremia    Hypertension    Hypothyroidism    Lacunar infarction ([place])    Stroke ([place])    TSH elevation    Past Surgical History:  Procedure Laterality Date   ABDOMINAL HYSTERECTOMY     ovaries remain    APPENDECTOMY  1989-1990   CARDIAC CATHETERIZATION     10/08/04: 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 NL LVF. EF 60%.   CHOLECYSTECTOMY N/A 05/24/2014   Procedure: LAPAROSCOPIC CHOLECYSTECTOMY WITH INTRAOPERATIVE CHOLANGIOGRAM;  Surgeon: Coralie Keens, MD;  Location: [place];  Service: General;  Laterality: N/A;   HERNIA REPAIR     umbilical hernia repair as a child   JOINT REPLACEMENT     TONSILLECTOMY     TOTAL HIP ARTHROPLASTY Left 12/28/2017   TOTAL HIP ARTHROPLASTY Left 12/28/2017   Procedure: LEFT TOTAL HIP ARTHROPLASTY ANTERIOR APPROACH;  Surgeon: Mcarthur Rossetti, MD;  Location: [place];  Service: Orthopedics;  Laterality: Left;   Patient Active Problem List   Diagnosis Date Noted   Unilateral primary osteoarthritis, left hip 12/28/2017   Status post total replacement of left hip 12/28/2017   Cholecystitis 05/23/2014   Acute  cholecystitis    HYPERLIPIDEMIA-MIXED 07/17/2009   HYPERTENSION, BENIGN 07/17/2009   SYNCOPE AND COLLAPSE 07/17/2009   EDEMA 07/17/2009    REFERRING DIAG: REFERRING DIAG: S16.1XXA (ICD-10-CM) - Strain of muscle, fascia and tendon at neck level, initial encounter S16.1XXA (ICD-10-CM) - Strain of neck  THERAPY DIAG:  Cervicalgia  Abnormal posture  Rationale for Evaluation and Treatment Rehabilitation  PERTINENT HISTORY: Hx lacunar infarct per pt, HLD, HTN, previous L THA  PRECAUTIONS: none  SUBJECTIVE:  Pt arrives with 6/10 pain on NPS she attributes to increased work activities. Denies any significant soreness after last session. Pt notes that symptoms do not bother her in most of her daily activities unless they are flared up by patient care. Pt states she saw MD and they are considering round of injections.    PAIN:  Are you having pain: 6/10 NPS Location: mid cervical, reports 1 headache in past week (present on arrival but resolves as session progresses) How would you describe your pain? 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 Vertebral body heights are normal. Anterior spurring from C3-C4 through C7-T1, mild associated disc space narrowing most prominent at C4-C5. Mild-to-moderate multilevel facet hypertrophy. Limited assessment of left bony neural foramina due to positioning. No evidence of fracture. No prevertebral soft tissue thickening.   IMPRESSION: 1. No acute radiographic findings. 2. Mild multilevel degenerative  change. 3. Trace retrolisthesis of C4 on C5.   PATIENT SURVEYS:  [place] 10/21/21: 29     COGNITION: Overall cognitive status: Within functional limits for tasks assessed     SENSATION: WFL BUE   POSTURE:  forward head posture, elevated UTs  (L more prominent than R)   PALPATION: Tenderness to palpation B UT/LS, significant relief with STM and improvement in headache. Most tender upper paracervical musculature, suboccipitals, improves with STM. Reports nonpainful stretch with cervical distraction, also non-relieving.      CERVICAL ROM:    Active ROM A/PROM (deg) eval AROM 10/07/21 AROM 10/16/21 AROM 10/23/21  10/23/21 post treatment AROM  Flexion 100% p!   100% no pain   Extension 100% p!   100% p!   Right rotation 50 p! 50 56 painless, mild stiffness 50 mildly stiff, no pain 62, painless  Left rotation 38 p! 47 55 painless 56, painless 60, painless    (Blank rows = not tested) Comments: R rotation more painful than L, all other directions roughly equivalent and provoke mid cervical pain   UPPER EXTREMITY ROM:   Active ROM Right eval Left eval  Shoulder flexion [facility]  Shoulder abduction [facility] WFL  Elbow flexion [facility] WFL  Elbow extension WFL WFL   (Blank rows = not tested) Comments: does exhibit neck pain/headache with movement but no mobility limitations   UPPER EXTREMITY MMT:   MMT Right eval Left eval  Shoulder flexion 4- 4-  Shoulder internal rotation 4 4  Shoulder external rotation 4 4   (Blank rows = not tested)   Comments: nonpainful "strain" in upper back with shoulder flex     TODAY'S TREATMENT: [facility] Adult PT Treatment:                                                DATE: 10/30/2021  Therapeutic Exercise: Pulley AAROM 90mn Sidebending cervical isos x10 flex/ext/sidebending, cues for graded resistance GTB row x10, BTB 2x8 Shoulder ext GTB 3x10 Manual Therapy: Supine - STM to paracervical musculature, upper traps, levator scap. gentle cervical  distraction and suboccipital release LS pin and stretch, seated, x8 B   [facility] Adult PT Treatment:                                                DATE: 10/23/21 Therapeutic Exercise: Swiss ball up wall with gentle push and lift off x10 Chin tuck at wall with ball 2x10 + B scaption to 90 deg Cervical sidebending isos x10 flex/ext/sidebending BTB rows 3x8, cues for setup and positioning BTB shoulder ext 2x8 Shoulder shrugs 4# 2x8 Manual Therapy: Supine - STM to paracervical musculature, upper traps, levator scap. gentle cervical distraction and suboccipital release Contract relax cervical rotation 2x5 B  [facility] Adult PT Treatment:                                                DATE: 10/21/21 Therapeutic Exercise: Swiss ball up wall with gentle push x12 Chin tuck at wall with ball 3x10 GTB rows 3x10 GTB shoulder ext 3x10 Shoulder shrugs no weight 2x15 Manual Therapy: Supine - STM to paracervical musculature, upper traps, levator scap. gentle cervical distraction and suboccipital release      PATIENT EDUCATION:  Education details: rationale for interventions throughout, education on PT progression Person educated: Patient Education method: Explanation, Demonstration, Tactile cues, Verbal cues Education comprehension: verbalized understanding, returned demonstration, verbal cues required, tactile cues required, and needs further education      HOME EXERCISE PROGRAM: Access Code: [email or web address] URL: [email or web address] Date: 10/09/2021 Prepared by: Enis Slipper  Exercises - Seated Shoulder Shrugs  - 1 x daily - 7 x weekly - 3 sets - 10 reps - Shoulder External Rotation and Scapular Retraction with Resistance  - 1 x daily - 7 x weekly - 3 sets - 10 reps - Standing Isometric Cervical Sidebending with Manual Resistance  - 1 x daily - 7 x weekly - 3 sets - 8 reps - 3sec hold   ASSESSMENT:   CLINICAL IMPRESSION: Pt is a 66 y.o woman arriving to PT on this date for  follow up w/ neck pain and headaches. Pt arrives with increased pain on this date she attributes to work activities, subsequently modified program for reduced intensity/volume as needed. Intermittent cues for pacing and form, requires increased rest breaks on this date. Session ended with manual to mitigate symptoms and post exercise soreness. Pt departs with report of improved symptoms (3/10 compared to 6/10 on arrival), no adverse events throughout.   Recommend skilled PT to address aforementioned deficits to maximize functional independence and tolerance. Pt departs today's session in no acute distress, all voiced questions/concerns addressed appropriately from PT perspective.       OBJECTIVE IMPAIRMENTS decreased activity tolerance, decreased endurance, decreased mobility, decreased ROM, decreased strength, impaired flexibility, and pain.    ACTIVITY LIMITATIONS carrying, lifting, bending, sitting, and caring for others   PARTICIPATION LIMITATIONS: community activity and occupation   [place] are also affecting patient's functional outcome.    REHAB POTENTIAL: Good   CLINICAL DECISION MAKING: Stable/uncomplicated   EVALUATION COMPLEXITY: Low     GOALS: Goals reviewed with patient? Yes   SHORT TERM GOALS: Target date: 10/24/2021    Pt will score at least 58 on FOTO in order to demonstrate improved perception of function due to symptoms and increased participation in work activities.  Baseline: 51 10/21/21: 55 Goal status: progressing   Pt will demonstrate appropriate understanding and performance of initially prescribed HEP in order to facilitate improved independence with management of symptoms.  Baseline: HEP provided on eval 10/21/21: independent Goal status: MET   2. Pt will report improved pain of 3/10 on NPS while performing work duties such as sitting for documentation/chart review.             Baseline: 5-6/10 NPS  10/21/21: 3/10 NPS during  documentation            Goal status: MET       LONG TERM GOALS: Target date: 11/21/2021   Pt will score 65 on FOTO in order to demonstrate improved perception  of function d/t neck pain. Baseline: 51 10/21/21: 55 Goal status: ongoing   2.  Pt will demonstrate at least 65 degrees of active cervical rotation in order to maximize cervical mobility for environmental awareness/safety in work setting. Baseline: 50 deg R rotation, 38 deg L rotation Goal status: INITIAL   3.  Pt will demonstrate at least 4+/5 shoulder flexion MMT for improved symmetry of UE strength and improved tolerance to functional movements.  Baseline: 4- Goal status: INITIAL        PLAN: PT FREQUENCY: 1-2x/week (plan for 2x/week first 4 weeks, followed by 1x/week for 4 weeks)   PT DURATION: 8 weeks (12 visits total)   PLANNED INTERVENTIONS: Therapeutic exercises, Therapeutic activity, Neuromuscular re-education, Balance training, Gait training, Patient/Family education, Self Care, Joint mobilization, Joint manipulation, Stair training, Aquatic Therapy, Dry Needling, Electrical stimulation, Cryotherapy, Moist heat, Manual therapy, and Re-evaluation   PLAN FOR NEXT SESSION: progress as able/appropriate with periscapular strength/stability    Leeroy Cha PT, DPT 10/30/2021 10:14 AM

## 2021-11-01 ENCOUNTER — Other Ambulatory Visit (HOSPITAL_COMMUNITY): Payer: Self-pay

## 2021-11-03 ENCOUNTER — Other Ambulatory Visit (HOSPITAL_COMMUNITY): Payer: Self-pay

## 2021-11-03 MED ORDER — OMEPRAZOLE 20 MG PO CPDR
20.0000 mg | DELAYED_RELEASE_CAPSULE | Freq: Two times a day (BID) | ORAL | 1 refills | Status: DC
  Filled 2021-11-03: qty 180, 90d supply, fill #0
  Filled 2022-02-09: qty 180, 90d supply, fill #1

## 2021-11-04 ENCOUNTER — Other Ambulatory Visit (HOSPITAL_COMMUNITY): Payer: Self-pay

## 2021-11-05 NOTE — Therapy (Signed)
OUTPATIENT PHYSICAL THERAPY TREATMENT NOTE   Patient Name: Theresa Nolan MRN: [id number] DOB:[birth year], 66 y.o., female 26 Date: 11/07/2021  PCP: Kelton Pillar MD   REFERRING PROVIDER: Drucilla Chalet NP  END OF SESSION:   PT End of Session - 11/07/21 0928     Visit Number 10    Number of Visits 12    Date for PT Re-Evaluation 11/21/21    Authorization Type Workers comp    Authorization - Visit Number 10    Authorization - Number of Visits 12    PT Start Time 0930    PT Stop Time 1010    PT Time Calculation (min) 40 min    Activity Tolerance Patient tolerated treatment well;No increased pain    Behavior During Therapy WFL for tasks assessed/performed                    Past Medical History:  Diagnosis Date   Chronic back pain    Degenerative joint disease    GERD (gastroesophageal reflux disease)    Hypercholesteremia    Hypertension    Hypothyroidism    Lacunar infarction ([place])    Stroke ([place])    TSH elevation    Past Surgical History:  Procedure Laterality Date   ABDOMINAL HYSTERECTOMY     ovaries remain    APPENDECTOMY  1989-1990   CARDIAC CATHETERIZATION     10/08/04: 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 NL LVF. EF 60%.   CHOLECYSTECTOMY N/A 05/24/2014   Procedure: LAPAROSCOPIC CHOLECYSTECTOMY WITH INTRAOPERATIVE CHOLANGIOGRAM;  Surgeon: Coralie Keens, MD;  Location: [place];  Service: General;  Laterality: N/A;   HERNIA REPAIR     umbilical hernia repair as a child   JOINT REPLACEMENT     TONSILLECTOMY     TOTAL HIP ARTHROPLASTY Left 12/28/2017   TOTAL HIP ARTHROPLASTY Left 12/28/2017   Procedure: LEFT TOTAL HIP ARTHROPLASTY ANTERIOR APPROACH;  Surgeon: Mcarthur Rossetti, MD;  Location: [place];  Service: Orthopedics;  Laterality: Left;   Patient Active Problem List   Diagnosis Date Noted   Unilateral primary osteoarthritis, left hip 12/28/2017   Status post total replacement of left hip 12/28/2017   Cholecystitis  05/23/2014   Acute cholecystitis    HYPERLIPIDEMIA-MIXED 07/17/2009   HYPERTENSION, BENIGN 07/17/2009   SYNCOPE AND COLLAPSE 07/17/2009   EDEMA 07/17/2009    REFERRING DIAG: REFERRING DIAG: S16.1XXA (ICD-10-CM) - Strain of muscle, fascia and tendon at neck level, initial encounter S16.1XXA (ICD-10-CM) - Strain of neck  THERAPY DIAG:  Cervicalgia  Abnormal posture  Rationale for Evaluation and Treatment Rehabilitation  PERTINENT HISTORY: Hx lacunar infarct per pt, HLD, HTN, previous L THA  PRECAUTIONS: none  SUBJECTIVE:  Pt arrives without pain, states she continues to only have pain with work activities, no difficulties in daily activities. Pt states she hasn't been doing HEP as she as been busy. Mild soreness after last session but not significant    PAIN:  Are you having pain: 0/10 NPS Location: mid cervical, reports 1 headache in past week while at work How would you describe your pain? 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 Vertebral body heights are normal. Anterior spurring from C3-C4 through C7-T1, mild associated disc space narrowing most prominent at C4-C5. Mild-to-moderate multilevel facet hypertrophy. Limited assessment of left bony neural foramina due to positioning. No evidence of fracture. No prevertebral soft tissue thickening.   IMPRESSION: 1. No acute radiographic findings. 2. Mild multilevel degenerative change. 3. Trace retrolisthesis of C4 on C5.   PATIENT SURVEYS:  [place]  10/21/21: 68     COGNITION: Overall cognitive status: Within functional limits for tasks assessed     SENSATION: WFL BUE   POSTURE:  forward head posture, elevated UTs  (L more prominent than R)   PALPATION: Tenderness to palpation B UT/LS, significant relief with STM and improvement in headache. Most tender upper paracervical musculature, suboccipitals, improves with STM. Reports nonpainful stretch with cervical distraction, also non-relieving.      CERVICAL ROM:    Active ROM A/PROM (deg) eval AROM 10/07/21 AROM 10/16/21 AROM 10/23/21  10/23/21 post treatment AROM  Flexion 100% p!   100% no pain   Extension 100% p!   100% p!   Right rotation 50 p! 50 56 painless, mild stiffness 50 mildly stiff, no pain 62, painless  Left rotation 38 p! 47 55 painless 56, painless 60, painless    (Blank rows = not tested) Comments: R rotation more painful than L, all other directions roughly equivalent and provoke mid cervical pain   UPPER EXTREMITY ROM:   Active ROM Right eval Left eval  Shoulder flexion [facility]  Shoulder abduction [facility] WFL  Elbow flexion [facility] WFL  Elbow extension WFL WFL   (Blank rows = not tested) Comments: does exhibit neck pain/headache with movement but no mobility limitations   UPPER EXTREMITY MMT:   MMT Right eval Left eval  Shoulder flexion 4- 4-  Shoulder internal rotation 4 4  Shoulder external rotation 4 4   (Blank rows = not tested)   Comments: nonpainful "strain" in upper back with shoulder flex     TODAY'S TREATMENT: [facility] Adult PT Treatment:                                                DATE: 11/07/21 Therapeutic Exercise: Pulley AAROM 26mn  GTB scap + B ER 2x8, cues for setup GTB row at door 2x12, cues for setup and form GTB shoulder ext at door 2x10, cues for setup and reduced compensations at elbow GTB seated shrugs (tucked under feet) x12, 5# B shrugs standing 2x10 Chin tuck ball at wall, standing, 2x10, cues for set up Cervical isos 3 way,  self resisted, x10 each way with 2-3 sec hold, cues for form and graded resistance Horiz abd GTB x12, cues for form/setup    [facility]                                                [facility]: [facility]  Therapeutic Exercise: Pulley AAROM 357m Sidebending cervical isos x10 flex/ext/sidebending, cues for graded resistance GTB row x10, BTB 2x8 Shoulder ext GTB 3x10 Manual Therapy: Supine - STM to paracervical musculature, upper traps, levator scap. gentle cervical distraction and suboccipital release LS pin and stretch, seated,  [facility] Adult PT Treatment:                                                DATE: 10/23/21 Therapeutic Exercise: Swiss ball up wall with gentle push and lift off x10 Chin tuck at wall with ball 2x10 + B scaption to 90 deg Cervical sidebending isos x10 flex/ext/sidebending BTB rows 3x8, cues for setup and positioning BTB shoulder ext 2x8 Shoulder shrugs 4# 2x8 Manual Therapy: Supine - STM to paracervical musculature, upper traps, levator scap. gentle cervical distraction and suboccipital release Contract relax cervical rotation 2x5 B      PATIENT EDUCATION:  Education details: rationale for interventions throughout, education on PT progression, discussion re: set up for HEP at home with green band Person educated: Patient Education method: Explanation, Demonstration, Tactile cues, Verbal cues Education comprehension: verbalized understanding, returned demonstration, verbal cues required, tactile cues required, and needs further education      HOME EXERCISE PROGRAM: Access Code: [email or web address] URL: [email or web address] Date: 10/09/2021 Prepared by: Enis Slipper  Exercises - Seated Shoulder Shrugs  - 1 x daily - 7 x weekly - 3 sets - 10 reps - Shoulder External Rotation and Scapular Retraction with Resistance  - 1 x daily - 7 x weekly - 3 sets - 10 reps - Standing Isometric Cervical Sidebending with Manual Resistance  - 1 x daily  - 7 x weekly - 3 sets - 8 reps - 3sec hold   ASSESSMENT:   CLINICAL IMPRESSION: Pt is a 66 y.o woman arriving to PT on this date for follow up w/ neck pain and headaches. Pt arrives with reports of no pain, states she feels good overall except for work activities (specifically heavy patient care). Today's session focusing on setup/performance of exercise program with education on transfer to home environment, provided with GTB. Pt receptive to education and verbalizes good understanding, demos good performance. Pt tolerates session well with no adverse events, denies increase in pain as session goes. Deferred manual on this date as pt denies any significant symptoms. Pt departs today's session in no acute distress, all voiced questions/concerns addressed appropriately from PT perspective.      OBJECTIVE IMPAIRMENTS decreased activity tolerance, decreased endurance, decreased mobility, decreased ROM, decreased strength, impaired flexibility, and pain.    ACTIVITY LIMITATIONS carrying, lifting, bending, sitting, and caring for others   PARTICIPATION LIMITATIONS: community activity and occupation   [place] are also affecting patient's functional outcome.    REHAB POTENTIAL: Good   CLINICAL DECISION MAKING: Stable/uncomplicated   EVALUATION COMPLEXITY: Low     GOALS: Goals reviewed with patient? Yes   SHORT TERM GOALS: Target date: 10/24/2021    Pt will score at least 58 on FOTO in order to demonstrate improved perception of function due to symptoms and increased participation in work activities.  Baseline: 51 10/21/21: 55 Goal status: progressing   Pt will demonstrate appropriate understanding and performance of initially prescribed HEP in order to facilitate improved independence with management of symptoms.  Baseline: HEP provided on eval 10/21/21: independent Goal status: MET   2. Pt will report improved pain of 3/10 on NPS while performing work duties such as  sitting for documentation/chart review.             Baseline: 5-6/10 NPS  10/21/21: 3/10 NPS during documentation  Goal status: MET       LONG TERM GOALS: Target date: 11/21/2021   Pt will score 65 on FOTO in order to demonstrate improved perception of function d/t neck pain. Baseline: 51 10/21/21: 55 Goal status: ongoing   2.  Pt will demonstrate at least 65 degrees of active cervical rotation in order to maximize cervical mobility for environmental awareness/safety in work setting. Baseline: 50 deg R rotation, 38 deg L rotation Goal status: INITIAL   3.  Pt will demonstrate at least 4+/5 shoulder flexion MMT for improved symmetry of UE strength and improved tolerance to functional movements.  Baseline: 4- Goal status: INITIAL        PLAN: PT FREQUENCY: 1-2x/week (plan for 2x/week first 4 weeks, followed by 1x/week for 4 weeks)   PT DURATION: 8 weeks (12 visits total)   PLANNED INTERVENTIONS: Therapeutic exercises, Therapeutic activity, Neuromuscular re-education, Balance training, Gait training, Patient/Family education, Self Care, Joint mobilization, Joint manipulation, Stair training, Aquatic Therapy, Dry Needling, Electrical stimulation, Cryotherapy, Moist heat, Manual therapy, and Re-evaluation   PLAN FOR NEXT SESSION: continue periscapular/cervical strength/stability exercises with emphasis on home performance. Initiate discharge planning, HEP updates as needed. Continue to wean manual as able to prepare for d/c    Leeroy Cha PT, DPT 11/07/2021 10:14 AM

## 2021-11-06 ENCOUNTER — Other Ambulatory Visit (HOSPITAL_COMMUNITY): Payer: Self-pay

## 2021-11-06 MED ORDER — TRAMADOL HCL 50 MG PO TABS
50.0000 mg | ORAL_TABLET | Freq: Three times a day (TID) | ORAL | 0 refills | Status: DC | PRN
  Filled 2021-11-06: qty 90, 30d supply, fill #0

## 2021-11-07 ENCOUNTER — Encounter: Payer: Self-pay | Admitting: Physical Therapy

## 2021-11-07 ENCOUNTER — Ambulatory Visit: Payer: PRIVATE HEALTH INSURANCE | Admitting: Physical Therapy

## 2021-11-07 DIAGNOSIS — M542 Cervicalgia: Secondary | ICD-10-CM

## 2021-11-07 DIAGNOSIS — R293 Abnormal posture: Secondary | ICD-10-CM

## 2021-11-11 ENCOUNTER — Ambulatory Visit: Payer: PRIVATE HEALTH INSURANCE | Admitting: Physical Therapy

## 2021-11-11 ENCOUNTER — Encounter: Payer: Self-pay | Admitting: Physical Therapy

## 2021-11-11 DIAGNOSIS — M542 Cervicalgia: Secondary | ICD-10-CM

## 2021-11-11 DIAGNOSIS — R293 Abnormal posture: Secondary | ICD-10-CM

## 2021-11-11 NOTE — Therapy (Signed)
OUTPATIENT PHYSICAL THERAPY TREATMENT NOTE   Patient Name: Theresa Nolan MRN: [id number] DOB:1955/11/05, 66 y.o., female 59 Date: 11/11/2021  PCP: Kelton Pillar MD   REFERRING PROVIDER: Drucilla Chalet NP  END OF SESSION:   PT End of Session - 11/11/21 0936     Visit Number 11    Number of Visits 12    Date for PT Re-Evaluation 11/21/21    Authorization Type Workers comp    Authorization - Visit Number 11    Authorization - Number of Visits 12    PT Start Time 0935    PT Stop Time 1015    PT Time Calculation (min) 40 min                    Past Medical History:  Diagnosis Date   Chronic back pain    Degenerative joint disease    GERD (gastroesophageal reflux disease)    Hypercholesteremia    Hypertension    Hypothyroidism    Lacunar infarction ([place])    Stroke ([place])    TSH elevation    Past Surgical History:  Procedure Laterality Date   ABDOMINAL HYSTERECTOMY     ovaries remain    APPENDECTOMY  1989-1990   CARDIAC CATHETERIZATION     10/08/04: 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 NL LVF. EF 60%.   CHOLECYSTECTOMY N/A 05/24/2014   Procedure: LAPAROSCOPIC CHOLECYSTECTOMY WITH INTRAOPERATIVE CHOLANGIOGRAM;  Surgeon: Coralie Keens, MD;  Location: [place];  Service: General;  Laterality: N/A;   HERNIA REPAIR     umbilical hernia repair as a child   JOINT REPLACEMENT     TONSILLECTOMY     TOTAL HIP ARTHROPLASTY Left 12/28/2017   TOTAL HIP ARTHROPLASTY Left 12/28/2017   Procedure: LEFT TOTAL HIP ARTHROPLASTY ANTERIOR APPROACH;  Surgeon: Mcarthur Rossetti, MD;  Location: [place];  Service: Orthopedics;  Laterality: Left;   Patient Active Problem List   Diagnosis Date Noted   Unilateral primary osteoarthritis, left hip 12/28/2017   Status post total replacement of left hip 12/28/2017   Cholecystitis 05/23/2014   Acute cholecystitis    HYPERLIPIDEMIA-MIXED 07/17/2009   HYPERTENSION, BENIGN 07/17/2009   SYNCOPE AND COLLAPSE 07/17/2009    EDEMA 07/17/2009    REFERRING DIAG: REFERRING DIAG: S16.1XXA (ICD-10-CM) - Strain of muscle, fascia and tendon at neck level, initial encounter S16.1XXA (ICD-10-CM) - Strain of neck  THERAPY DIAG:  Cervicalgia  Abnormal posture  Rationale for Evaluation and Treatment Rehabilitation  PERTINENT HISTORY: Hx lacunar infarct per pt, HLD, HTN, previous L THA  PRECAUTIONS: none  SUBJECTIVE:  Pt reports increased pain with 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 Pain is 6/10 as patient just left work.     PAIN:  Are you having pain: 0/10 NPS Location: mid cervical, reports 1 headache in past week while at work How would you describe your pain? 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 Vertebral body heights are normal. Anterior spurring from C3-C4 through C7-T1, mild associated disc space narrowing most prominent at C4-C5. Mild-to-moderate multilevel facet hypertrophy. Limited assessment of left  bony neural foramina due to positioning. No evidence of fracture. No prevertebral soft tissue thickening.   IMPRESSION: 1. No acute radiographic findings. 2. Mild multilevel degenerative change. 3. Trace retrolisthesis of C4 on C5.   PATIENT SURVEYS:  [place] 10/21/21: 31     COGNITION: Overall cognitive status: Within functional limits for tasks assessed     SENSATION: WFL BUE   POSTURE:  forward head posture, elevated UTs  (L more prominent than R)   PALPATION: Tenderness to palpation B UT/LS,  significant relief with STM and improvement in headache. Most tender upper paracervical musculature, suboccipitals, improves with STM. Reports nonpainful stretch with cervical distraction, also non-relieving.      CERVICAL ROM:    Active ROM A/PROM (deg) eval AROM 10/07/21 AROM 10/16/21 AROM 10/23/21  10/23/21 post treatment AROM  Flexion 100% p!   100% no pain   Extension 100% p!   100% p!   Right rotation 50 p! 50 56 painless, mild stiffness 50 mildly stiff, no pain 62, painless  Left rotation 38 p! 47 55 painless 56, painless 60, painless    (Blank rows = not tested) Comments: R rotation more painful than L, all other directions roughly equivalent and provoke mid cervical pain   UPPER EXTREMITY ROM:   Active ROM Right eval Left eval  Shoulder flexion [facility]  Shoulder abduction [facility] WFL  Elbow flexion [facility] WFL  Elbow extension WFL WFL   (Blank rows = not tested) Comments: does exhibit neck pain/headache with movement but no mobility limitations   UPPER EXTREMITY MMT:   MMT Right eval Left eval Right 11/11/21 Left 11/11/21  Shoulder flexion 4- 4- 4+/5 4/5  Shoulder internal rotation 4 4    Shoulder external rotation 4 4     (Blank rows = not tested)   Comments: nonpainful "strain" in upper back with shoulder flex     TODAY'S TREATMENT: [facility] Adult PT Treatment:                                                DATE: 11/11/21 Therapeutic Exercise: Upper trap and levator stretch Scap retract GTB scap + B ER 2x8, cues for setup Standing Blue band Row- x 5 c/o left neck spask so disc.  Upper trap / levator stretches repeated.  Supine GTB horizontal abduction Chin tuck hook lying 5 sec x 10   Manual Therapy: Passive upper trap and levator stretch  Sub occipital release   Modalities: HMP x 10 min cervical    [facility] Adult PT Treatment:                                                DATE: 11/07/21 Therapeutic Exercise: Pulley AAROM 85mn  GTB scap + B ER 2x8, cues for  setup GTB row at door 2x12, cues for setup and form GTB shoulder ext at door 2x10, cues for setup and reduced compensations at elbow GTB seated shrugs (tucked under feet) x12, 5# B shrugs standing 2x10 Chin tuck ball at wall, standing, 2x10, cues for set up Cervical isos 3 way, self resisted, x10 each way with 2-3 sec hold, cues for form and graded resistance Horiz abd GTB x12, cues for form/setup  [facility] Adult PT Treatment:                                                DATE: 10/30/2021  Therapeutic Exercise: Pulley AAROM 66mn Sidebending cervical isos x10 flex/ext/sidebending, cues for graded resistance GTB row x10, BTB 2x8 Shoulder ext GTB 3x10 Manual Therapy: Supine - STM to paracervical musculature, upper traps, levator scap. gentle cervical distraction and suboccipital release LS pin and stretch, seated, x8 B   [facility] Adult PT Treatment:                                                DATE: 10/23/21 Therapeutic Exercise: Swiss ball up wall with gentle push and lift off x10 Chin tuck at wall with ball 2x10 + B scaption to 90 deg Cervical sidebending isos x10 flex/ext/sidebending BTB rows 3x8, cues for setup and positioning BTB shoulder ext 2x8 Shoulder shrugs 4# 2x8 Manual Therapy: Supine - STM to paracervical musculature, upper traps, levator scap. gentle cervical distraction and suboccipital release Contract relax cervical rotation 2x5 B      PATIENT EDUCATION:  Education details: rationale for interventions throughout, education on PT progression, discussion re: set up for HEP at home with green band Person educated: Patient Education method: Explanation, Demonstration, Tactile cues, Verbal cues Education comprehension: verbalized understanding, returned demonstration, verbal cues required, tactile cues required, and needs further education      HOME EXERCISE PROGRAM: Access Code: [email or web address]: [email or web address] Date: 10/09/2021 Prepared by: DEnis Slipper Exercises - Seated Shoulder Shrugs  - 1 x daily - 7 x weekly - 3 sets - 10 reps - Shoulder External Rotation and Scapular Retraction with Resistance  - 1 x daily - 7 x weekly - 3 sets - 10 reps - Standing Isometric Cervical Sidebending with Manual Resistance  - 1 x daily - 7 x weekly - 3 sets - 8 reps - 3sec hold   ASSESSMENT:   CLINICAL IMPRESSION: Pt is a 66y.o woman arriving to PT on this date for follow up w/ neck pain and headaches. Pt arrives with reports of no pain, states she feels good overall except for work activities (specifically heavy patient care). Today's session focusing on setup/performance of exercise program with education on transfer to home environment, provided with GTB. Pt receptive to education and verbalizes good understanding, demos good performance. Pt tolerates session well with no adverse events, denies increase in pain as session goes. Deferred manual on this date as pt denies any significant symptoms. Pt departs today's session in no acute distress, all voiced questions/concerns addressed appropriately from PT perspective.      OBJECTIVE IMPAIRMENTS decreased activity tolerance, decreased endurance, decreased mobility, decreased ROM, decreased strength, impaired flexibility, and pain.    ACTIVITY LIMITATIONS carrying, lifting, bending, sitting, and caring for others   PARTICIPATION LIMITATIONS: community activity and occupation   [place] also [place] patient's functional outcome.    REHAB POTENTIAL: Good   CLINICAL DECISION MAKING: Stable/uncomplicated   EVALUATION COMPLEXITY: Low     GOALS: Goals reviewed with patient? Yes   SHORT TERM GOALS: Target date: 10/24/2021    Pt will score at least 58 on FOTO in order to demonstrate improved perception of  function due to symptoms and increased participation in work activities.  Baseline: 51 10/21/21: 55 Goal status: progressing   Pt will demonstrate appropriate  understanding and performance of initially prescribed HEP in order to facilitate improved independence with management of symptoms.  Baseline: HEP provided on eval 10/21/21: independent Goal status: MET   2. Pt will report improved pain of 3/10 on NPS while performing work duties such as sitting for documentation/chart review.             Baseline: 5-6/10 NPS  10/21/21: 3/10 NPS during documentation            Goal status: MET       LONG TERM GOALS: Target date: 11/21/2021   Pt will score 65 on FOTO in order to demonstrate improved perception of function d/t neck pain. Baseline: 51 10/21/21: 55 Goal status: ONGOING   2.  Pt will demonstrate at least 65 degrees of active cervical rotation in order to maximize cervical mobility for environmental awareness/safety in work setting. Baseline: 50 deg R rotation, 38 deg L rotation Status:  improved 11/11/21: see objective measures  Goal status: ONGOING, NEARLY MET   3.  Pt will demonstrate at least 4+/5 shoulder flexion MMT for improved symmetry of UE strength and improved tolerance to functional movements.  Baseline: 4- Status: 4 to 4+/5 Goal status: PARTIALLY MET        PLAN: PT FREQUENCY: 1-2x/week (plan for 2x/week first 4 weeks, followed by 1x/week for 4 weeks)   PT DURATION: 8 weeks (12 visits total)   PLANNED INTERVENTIONS: Therapeutic exercises, Therapeutic activity, Neuromuscular re-education, Balance training, Gait training, Patient/Family education, Self Care, Joint mobilization, Joint manipulation, Stair training, Aquatic Therapy, Dry Needling, Electrical stimulation, Cryotherapy, Moist heat, Manual therapy, and Re-evaluation   PLAN FOR NEXT SESSION: continue periscapular/cervical strength/stability exercises with emphasis on home performance. Initiate discharge planning, HEP updates as needed. Continue to wean manual as able to prepare for d/c    Hessie Diener, PTA 11/11/21 10:43 AM Phone: [phone] Fax: [phone]

## 2021-11-17 ENCOUNTER — Other Ambulatory Visit (HOSPITAL_COMMUNITY): Payer: Self-pay

## 2021-11-17 NOTE — Therapy (Signed)
OUTPATIENT PHYSICAL THERAPY TREATMENT NOTE/DISCHARGE SUMMARY   Patient Name: Theresa Nolan MRN: [id number] DOB:05/17/1955, 66 y.o., female Today's Date: 11/18/2021  PCP: Kelton Pillar MD   REFERRING PROVIDER: Drucilla Chalet NP  PHYSICAL THERAPY DISCHARGE SUMMARY  Visits from Start of Care: 12  Current functional level related to goals / functional outcomes: Able to perform daily activities without issue, continued flareups with work activities   Remaining deficits: Continued pain/exacerbations with work activities   Education / Equipment: HEP safety/performance, discharge instructions   Patient agrees to discharge. Patient goals were partially met. Patient is being discharged due to maximized rehab potential.    END OF SESSION:   PT End of Session - 11/18/21 0933     Visit Number 12    Number of Visits 12    Date for PT Re-Evaluation 11/21/21    Authorization Type Workers comp    Authorization - Visit Number 12    Authorization - Number of Visits 12    PT Start Time 0933    PT Stop Time 0093    PT Time Calculation (min) 42 min    Activity Tolerance Patient tolerated treatment well;No increased pain    Behavior During Therapy WFL for tasks assessed/performed                     Past Medical History:  Diagnosis Date   Chronic back pain    Degenerative joint disease    GERD (gastroesophageal reflux disease)    Hypercholesteremia    Hypertension    Hypothyroidism    Lacunar infarction ([place])    Stroke ([place])    TSH elevation    Past Surgical History:  Procedure Laterality Date   ABDOMINAL HYSTERECTOMY     ovaries remain    APPENDECTOMY  1989-1990   CARDIAC CATHETERIZATION     10/08/04: 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 NL LVF. EF 60%.   CHOLECYSTECTOMY N/A 05/24/2014   Procedure: LAPAROSCOPIC CHOLECYSTECTOMY WITH INTRAOPERATIVE CHOLANGIOGRAM;  Surgeon: Coralie Keens, MD;  Location: [place];  Service: General;  Laterality: N/A;    HERNIA REPAIR     umbilical hernia repair as a child   JOINT REPLACEMENT     TONSILLECTOMY     TOTAL HIP ARTHROPLASTY Left 12/28/2017   TOTAL HIP ARTHROPLASTY Left 12/28/2017   Procedure: LEFT TOTAL HIP ARTHROPLASTY ANTERIOR APPROACH;  Surgeon: Mcarthur Rossetti, MD;  Location: [place];  Service: Orthopedics;  Laterality: Left;   Patient Active Problem List   Diagnosis Date Noted   Unilateral primary osteoarthritis, left hip 12/28/2017   Status post total replacement of left hip 12/28/2017   Cholecystitis 05/23/2014   Acute cholecystitis    HYPERLIPIDEMIA-MIXED 07/17/2009   HYPERTENSION, BENIGN 07/17/2009   SYNCOPE AND COLLAPSE 07/17/2009   EDEMA 07/17/2009    REFERRING DIAG: REFERRING DIAG: S16.1XXA (ICD-10-CM) - Strain of muscle, fascia and tendon at neck level, initial encounter S16.1XXA (ICD-10-CM) - Strain of neck  THERAPY DIAG:  Cervicalgia  Abnormal posture  Rationale for Evaluation and Treatment Rehabilitation  PERTINENT HISTORY: Hx lacunar infarct per pt, HLD, HTN, previous L THA  PRECAUTIONS: none  SUBJECTIVE:  Pt reports increased pain with weather changes and patient care last night. States overall she has been satisfied with progress, pain no longer affects daily life although she still continues to have pain with heavy patient care. Reports good compliance with HEP, no headaches in past weeks.     PAIN:  Are you having pain: 5/10 NPS (after  work last night) Location: mid cervical, no headaches How would you describe your pain? dull, mild stiffness Best: 0/10  Worst: 6/10 over past week with heavy patient care Aggravating factors: patient care Easing factors: medication, rest, ice/heat, stretching   OBJECTIVE: (objective measures completed at initial evaluation unless otherwise dated)     DIAGNOSTIC FINDINGS:  Per cervical XR 09/05/2021 FINDINGS: Trace retrolisthesis of C4 on C5. Vertebral body heights are normal. Anterior spurring from C3-C4  through C7-T1, mild associated disc space narrowing most prominent at C4-C5. Mild-to-moderate multilevel facet hypertrophy. Limited assessment of left bony neural foramina due to positioning. No evidence of fracture. No prevertebral soft tissue thickening.   IMPRESSION: 1. No acute radiographic findings. 2. Mild multilevel degenerative change. 3. Trace retrolisthesis of C4 on C5.   PATIENT SURVEYS:  FOTO [place] 10/21/21: 55 FOTO 11/18/21: 17     COGNITION: Overall cognitive status: Within functional limits for tasks assessed     SENSATION: WFL BUE   POSTURE:  forward head posture, elevated UTs  (L more prominent than R)  CERVICAL ROM:    Active ROM A/PROM (deg) eval AROM 10/07/21 AROM 10/16/21 AROM 10/23/21  10/23/21 post treatment AROM AROM 11/18/21  Flexion 100% p!   100% no pain  100% "uncomfortable stretch"  Extension 100% p!   100% p!  100% painless  Right rotation 50 p! 50 56 painless, mild stiffness 50 mildly stiff, no pain 62, painless 62 deg  Left rotation 38 p! 47 55 painless 56, painless 60, painless  58   (Blank rows = not tested) Comments: R rotation more painful than L, all other directions roughly equivalent and provoke mid cervical pain Coments 11/18/21: No overt pain with ROM although report of "uncomfortable stretch" with flexion   UPPER EXTREMITY ROM:   Active ROM Right eval Left eval  Shoulder flexion [facility]  Shoulder abduction [facility]  Elbow flexion [facility] WFL  Elbow extension WFL WFL   (Blank rows = not tested) Comments: does exhibit neck pain/headache with movement but no mobility limitations   UPPER EXTREMITY MMT:   MMT Right eval Left eval Right 11/11/21 Left 11/11/21 Right/Left 11/18/21  Shoulder flexion 4- 4- 4+/5 4/5 5/4+  Shoulder internal rotation 4 4     Shoulder external rotation 4 4      (Blank rows = not tested)   Comments: nonpainful "strain" in upper back with shoulder flex     TODAY'S TREATMENT: [facility] Adult PT  Treatment:                                                DATE: 11/18/21 Therapeutic Exercise: Pulley 14mn AAROM, seated Shrugs x10 no weight B ER + scaption GTB x10 Cervical isometrics x8 sidebending/fwd/backwards Row GTB x10 Shoulder ext GTB x10 LS stretch 3x30sec B, seated  Modalities: Moist hot pack, seated, 885m, no adverse events, good relief reported, time not billed   [facility]                                                DATE: 11/11/21 Therapeutic Exercise: Upper trap and levator stretch Scap retract GTB scap + B ER 2x8, cues for setup Standing Blue band Row- x  5 c/o left neck spask so disc.  Upper trap / levator stretches repeated.  Supine GTB horizontal abduction Chin tuck hook lying 5 sec x 10   Manual Therapy: Passive upper trap and levator stretch  Sub occipital release   Modalities: HMP x 10 min cervical  [facility] Adult PT Treatment:                                                DATE: 11/07/21 Therapeutic Exercise: Pulley AAROM 61mn  GTB scap + B ER 2x8, cues for setup GTB row at door 2x12, cues for setup and form GTB shoulder ext at door 2x10, cues for setup and reduced compensations at elbow GTB seated shrugs (tucked under feet) x12, 5# B shrugs standing 2x10 Chin tuck ball at wall, standing, 2x10, cues for set up Cervical isos 3 way, self resisted, x10 each way with 2-3 sec hold, cues for form and graded resistance Horiz abd GTB x12, cues for form/setup       PATIENT EDUCATION:  Education details: discharge education, rationale for interventions, faded feedback for HEP Person educated: Patient Education method: Explanation, Demonstration, Tactile cues, Verbal cues, and handouts Education comprehension: verbalized understanding, returned demonstration, verbal cues required, tactile cues required, and needs further education      HOME EXERCISE PROGRAM: Access Code: [email or web address]: [email or web address] Date:  11/18/2021 Prepared by: DEnis Slipper Exercises - Seated Shoulder Shrugs  - 3-4 x weekly - 3 sets - 10 reps - Standing Isometric Cervical Sidebending with Manual Resistance  - 3-4 x weekly - 3 sets - 8 reps - 3sec hold - Shoulder External Rotation and Scapular Retraction with Resistance  - 3-4 x weekly - 3 sets - 10 reps - Standing Shoulder Row with Anchored Resistance  - 3-4 x weekly - 3 sets - 10 reps - Shoulder extension with resistance - Neutral  - 3-4 x weekly - 3 sets - 10 reps - Gentle Levator Scapulae Stretch  - 3-4 x weekly - 1 sets - 3 reps - 30sec hold   ASSESSMENT:   CLINICAL IMPRESSION: Pt arrives to PT today for 12th visit. Since start of care pt has made improvements in cervical mobility and periscapular strength, reported improvements in tolerance to typical daily activities, although continues to have exacerbations with heavy patient care. After discussion re: pt progress and goals, pt agreeable to discharge at this time, states she plans to follow up with referring MD. Pt able to perform HEP without increase in pain, cues for transfer to home performance, verbally reviewed handout and discussed various options for program. Pt verbalizes good understanding and agreement. Goals addressed as noted below - overall pt has partially met projected goals. Pt requests moist heat at end of session, reports improvement in pain to 3/10 on NPS compared to 5/10 on arrival. Pt departs today's session in no acute distress, all voiced questions/concerns addressed appropriately from PT perspective.       OBJECTIVE IMPAIRMENTS decreased activity tolerance, decreased endurance, decreased mobility, decreased ROM, decreased strength, impaired flexibility, and pain.    ACTIVITY LIMITATIONS carrying, lifting, bending, sitting, and caring for others   PARTICIPATION LIMITATIONS: community activity and occupation   [place] also affecting patient's functional outcome.     REHAB POTENTIAL: Good   CLINICAL DECISION MAKING: Stable/uncomplicated   EVALUATION COMPLEXITY: Low  GOALS: Goals reviewed with patient? Yes   SHORT TERM GOALS: Target date: 10/24/2021    Pt will score at least 58 on FOTO in order to demonstrate improved perception of function due to symptoms and increased participation in work activities.  Baseline: 51 10/21/21: 55 11/18/21: 53 Goal status: NOT MET   Pt will demonstrate appropriate understanding and performance of initially prescribed HEP in order to facilitate improved independence with management of symptoms.  Baseline: HEP provided on eval 10/21/21: independent Goal status: MET   2. Pt will report improved pain of 3/10 on NPS while performing work duties such as sitting for documentation/chart review.             Baseline: 5-6/10 NPS  10/21/21: 3/10 NPS during documentation            Goal status: MET       LONG TERM GOALS: Target date: 11/21/2021   Pt will score 65 on FOTO in order to demonstrate improved perception of function d/t neck pain. Baseline: 51 10/21/21: 55 11/18/21: 53 Goal status: NOT MET   2.  Pt will demonstrate at least 65 degrees of active cervical rotation in order to maximize cervical mobility for environmental awareness/safety in work setting. Baseline: 50 deg R rotation, 38 deg L rotation Status: 62 deg to R, 58 deg to L Goal status: NEARLY MET   3.  Pt will demonstrate at least 4+/5 shoulder flexion MMT for improved symmetry of UE strength and improved tolerance to functional movements.  Baseline: 4- Status: 4+ to 5 Goal status: MET       PLAN (discharging 11/18/21): PT FREQUENCY: n/a   PT DURATION: n/a   PLANNED INTERVENTIONS: Therapeutic exercises, Therapeutic activity, Neuromuscular re-education, Balance training, Gait training, Patient/Family education, Self Care, Joint mobilization, Joint manipulation, Stair training, Aquatic Therapy, Dry Needling, Electrical stimulation,  Cryotherapy, Moist heat, Manual therapy, and Re-evaluation   PLAN FOR NEXT SESSION: discharge    Leeroy Cha PT, DPT 11/18/2021 1:22 PM

## 2021-11-18 ENCOUNTER — Encounter: Payer: Self-pay | Admitting: Physical Therapy

## 2021-11-18 ENCOUNTER — Ambulatory Visit: Payer: PRIVATE HEALTH INSURANCE | Admitting: Physical Therapy

## 2021-11-18 DIAGNOSIS — M542 Cervicalgia: Secondary | ICD-10-CM

## 2021-11-18 DIAGNOSIS — R293 Abnormal posture: Secondary | ICD-10-CM

## 2021-11-29 ENCOUNTER — Other Ambulatory Visit (HOSPITAL_COMMUNITY): Payer: Self-pay

## 2021-12-01 ENCOUNTER — Other Ambulatory Visit (HOSPITAL_COMMUNITY): Payer: Self-pay

## 2021-12-02 ENCOUNTER — Other Ambulatory Visit (HOSPITAL_COMMUNITY): Payer: Self-pay

## 2021-12-02 MED ORDER — PEG 3350-KCL-NA BICARB-NACL 420 G PO SOLR
ORAL | 0 refills | Status: DC
  Filled 2021-12-02: qty 4000, 1d supply, fill #0

## 2021-12-03 ENCOUNTER — Other Ambulatory Visit (HOSPITAL_COMMUNITY): Payer: Self-pay

## 2021-12-03 ENCOUNTER — Encounter: Payer: Self-pay | Admitting: Orthopedic Surgery

## 2021-12-03 MED ORDER — CARVEDILOL 12.5 MG PO TABS
12.5000 mg | ORAL_TABLET | Freq: Two times a day (BID) | ORAL | 1 refills | Status: DC
  Filled 2021-12-03: qty 180, 90d supply, fill #0
  Filled 2022-02-28: qty 180, 90d supply, fill #1

## 2021-12-03 MED ORDER — CARVEDILOL 12.5 MG PO TABS
12.5000 mg | ORAL_TABLET | Freq: Two times a day (BID) | ORAL | 1 refills | Status: DC
  Filled 2022-07-08: qty 180, 90d supply, fill #0
  Filled 2022-11-08: qty 180, 90d supply, fill #1

## 2021-12-09 ENCOUNTER — Other Ambulatory Visit: Payer: Self-pay | Admitting: Orthopedic Surgery

## 2021-12-09 DIAGNOSIS — D124 Benign neoplasm of descending colon: Secondary | ICD-10-CM | POA: Diagnosis not present

## 2021-12-09 DIAGNOSIS — K5730 Diverticulosis of large intestine without perforation or abscess without bleeding: Secondary | ICD-10-CM | POA: Diagnosis not present

## 2021-12-09 DIAGNOSIS — D125 Benign neoplasm of sigmoid colon: Secondary | ICD-10-CM | POA: Diagnosis not present

## 2021-12-09 DIAGNOSIS — K648 Other hemorrhoids: Secondary | ICD-10-CM | POA: Diagnosis not present

## 2021-12-09 DIAGNOSIS — M542 Cervicalgia: Secondary | ICD-10-CM

## 2021-12-09 DIAGNOSIS — Z86010 Personal history of colonic polyps: Secondary | ICD-10-CM | POA: Diagnosis not present

## 2021-12-09 DIAGNOSIS — Z09 Encounter for follow-up examination after completed treatment for conditions other than malignant neoplasm: Secondary | ICD-10-CM | POA: Diagnosis not present

## 2021-12-16 ENCOUNTER — Inpatient Hospital Stay: Admission: RE | Admit: 2021-12-16 | Payer: 59 | Source: Ambulatory Visit

## 2021-12-16 ENCOUNTER — Other Ambulatory Visit (HOSPITAL_COMMUNITY): Payer: Self-pay | Admitting: Orthopedic Surgery

## 2021-12-16 DIAGNOSIS — M542 Cervicalgia: Secondary | ICD-10-CM

## 2021-12-20 ENCOUNTER — Ambulatory Visit (HOSPITAL_COMMUNITY)
Admission: RE | Admit: 2021-12-20 | Discharge: 2021-12-20 | Disposition: A | Discharge status: Home / Self Care | Payer: PRIVATE HEALTH INSURANCE | Source: Ambulatory Visit | Attending: Orthopedic Surgery | Admitting: Orthopedic Surgery

## 2021-12-20 DIAGNOSIS — M542 Cervicalgia: Secondary | ICD-10-CM | POA: Diagnosis not present

## 2021-12-20 DIAGNOSIS — M4802 Spinal stenosis, cervical region: Secondary | ICD-10-CM | POA: Diagnosis not present

## 2021-12-20 DIAGNOSIS — M47812 Spondylosis without myelopathy or radiculopathy, cervical region: Secondary | ICD-10-CM | POA: Diagnosis not present

## 2021-12-20 DIAGNOSIS — I950 Idiopathic hypotension: Secondary | ICD-10-CM | POA: Diagnosis not present

## 2021-12-20 DIAGNOSIS — S199XXA Unspecified injury of neck, initial encounter: Secondary | ICD-10-CM | POA: Diagnosis not present

## 2021-12-25 ENCOUNTER — Other Ambulatory Visit: Payer: Self-pay | Admitting: Orthopedic Surgery

## 2021-12-25 ENCOUNTER — Ambulatory Visit
Admission: RE | Admit: 2021-12-25 | Discharge: 2021-12-25 | Disposition: A | Discharge status: Home / Self Care | Payer: 59 | Source: Ambulatory Visit | Attending: Orthopedic Surgery | Admitting: Orthopedic Surgery

## 2021-12-25 DIAGNOSIS — M542 Cervicalgia: Secondary | ICD-10-CM

## 2021-12-25 DIAGNOSIS — M4692 Unspecified inflammatory spondylopathy, cervical region: Secondary | ICD-10-CM | POA: Diagnosis not present

## 2021-12-25 MED ORDER — DEXAMETHASONE SODIUM PHOSPHATE 4 MG/ML IJ SOLN
15.0000 mg | Freq: Once | INTRAMUSCULAR | Status: AC
  Administered 2021-12-25: 15.2 mg via INTRA_ARTICULAR

## 2021-12-25 MED ORDER — IOPAMIDOL (ISOVUE-M 300) INJECTION 61%
1.0000 mL | Freq: Once | INTRAMUSCULAR | Status: AC
  Administered 2021-12-25: 1 mL via INTRA_ARTICULAR

## 2021-12-25 NOTE — Discharge Instructions (Signed)

## 2021-12-29 ENCOUNTER — Other Ambulatory Visit (HOSPITAL_COMMUNITY): Payer: Self-pay

## 2021-12-29 MED ORDER — CYCLOBENZAPRINE HCL 10 MG PO TABS
10.0000 mg | ORAL_TABLET | Freq: Every evening | ORAL | 1 refills | Status: DC | PRN
  Filled 2021-12-29: qty 30, 30d supply, fill #0
  Filled 2022-02-09: qty 30, 30d supply, fill #1

## 2021-12-30 ENCOUNTER — Other Ambulatory Visit (HOSPITAL_COMMUNITY): Payer: Self-pay

## 2021-12-31 ENCOUNTER — Other Ambulatory Visit: Payer: Self-pay | Admitting: Orthopedic Surgery

## 2021-12-31 DIAGNOSIS — M542 Cervicalgia: Secondary | ICD-10-CM

## 2022-01-08 ENCOUNTER — Ambulatory Visit
Admission: RE | Admit: 2022-01-08 | Discharge: 2022-01-08 | Disposition: A | Discharge status: Home / Self Care | Payer: Worker's Compensation | Source: Ambulatory Visit | Attending: Orthopedic Surgery | Admitting: Orthopedic Surgery

## 2022-01-08 ENCOUNTER — Other Ambulatory Visit (HOSPITAL_COMMUNITY): Payer: Self-pay

## 2022-01-08 ENCOUNTER — Other Ambulatory Visit: Payer: Self-pay | Admitting: Orthopedic Surgery

## 2022-01-08 DIAGNOSIS — M542 Cervicalgia: Secondary | ICD-10-CM

## 2022-01-08 MED ORDER — IOPAMIDOL (ISOVUE-M 300) INJECTION 61%
1.0000 mL | Freq: Once | INTRAMUSCULAR | Status: AC
  Administered 2022-01-08: 1 mL via INTRA_ARTICULAR

## 2022-01-08 MED ORDER — DEXAMETHASONE SODIUM PHOSPHATE 4 MG/ML IJ SOLN
15.0000 mg | Freq: Once | INTRAMUSCULAR | Status: AC
  Administered 2022-01-08: 15 mg via INTRA_ARTICULAR

## 2022-01-08 NOTE — Discharge Instructions (Signed)

## 2022-01-09 ENCOUNTER — Other Ambulatory Visit (HOSPITAL_COMMUNITY): Payer: Self-pay

## 2022-01-09 MED ORDER — TRAMADOL HCL 50 MG PO TABS
50.0000 mg | ORAL_TABLET | Freq: Three times a day (TID) | ORAL | 0 refills | Status: DC | PRN
  Filled 2022-01-09: qty 90, 30d supply, fill #0

## 2022-01-14 ENCOUNTER — Ambulatory Visit
Admission: RE | Admit: 2022-01-14 | Discharge: 2022-01-14 | Disposition: A | Discharge status: Home / Self Care | Payer: 59 | Source: Ambulatory Visit | Attending: Family Medicine | Admitting: Family Medicine

## 2022-01-14 DIAGNOSIS — Z13820 Encounter for screening for osteoporosis: Secondary | ICD-10-CM

## 2022-01-14 DIAGNOSIS — Z0389 Encounter for observation for other suspected diseases and conditions ruled out: Secondary | ICD-10-CM | POA: Diagnosis not present

## 2022-01-16 ENCOUNTER — Other Ambulatory Visit (HOSPITAL_COMMUNITY): Payer: Self-pay

## 2022-02-09 ENCOUNTER — Other Ambulatory Visit (HOSPITAL_COMMUNITY): Payer: Self-pay

## 2022-02-10 ENCOUNTER — Other Ambulatory Visit: Payer: Self-pay | Admitting: Family Medicine

## 2022-02-10 ENCOUNTER — Other Ambulatory Visit (HOSPITAL_COMMUNITY): Payer: Self-pay

## 2022-02-10 DIAGNOSIS — Z1231 Encounter for screening mammogram for malignant neoplasm of breast: Secondary | ICD-10-CM

## 2022-02-10 MED ORDER — LOSARTAN POTASSIUM 100 MG PO TABS
100.0000 mg | ORAL_TABLET | Freq: Every day | ORAL | 0 refills | Status: DC
  Filled 2022-02-10: qty 90, 90d supply, fill #0

## 2022-02-18 ENCOUNTER — Other Ambulatory Visit (HOSPITAL_COMMUNITY): Payer: Self-pay

## 2022-02-18 DIAGNOSIS — M5137 Other intervertebral disc degeneration, lumbosacral region: Secondary | ICD-10-CM | POA: Diagnosis not present

## 2022-02-18 DIAGNOSIS — E559 Vitamin D deficiency, unspecified: Secondary | ICD-10-CM | POA: Diagnosis not present

## 2022-02-18 DIAGNOSIS — E669 Obesity, unspecified: Secondary | ICD-10-CM | POA: Diagnosis not present

## 2022-02-18 DIAGNOSIS — K219 Gastro-esophageal reflux disease without esophagitis: Secondary | ICD-10-CM | POA: Diagnosis not present

## 2022-02-18 DIAGNOSIS — E039 Hypothyroidism, unspecified: Secondary | ICD-10-CM | POA: Diagnosis not present

## 2022-02-18 DIAGNOSIS — E7800 Pure hypercholesterolemia, unspecified: Secondary | ICD-10-CM | POA: Diagnosis not present

## 2022-02-18 DIAGNOSIS — I10 Essential (primary) hypertension: Secondary | ICD-10-CM | POA: Diagnosis not present

## 2022-02-18 MED ORDER — MELOXICAM 7.5 MG PO TABS
7.5000 mg | ORAL_TABLET | Freq: Every day | ORAL | 3 refills | Status: DC
  Filled 2022-02-18: qty 30, 30d supply, fill #0
  Filled 2022-03-16: qty 30, 30d supply, fill #1
  Filled 2022-04-22 – 2022-04-29 (×2): qty 30, 30d supply, fill #2
  Filled 2022-05-31: qty 30, 30d supply, fill #3

## 2022-02-18 MED ORDER — METHOCARBAMOL 750 MG PO TABS
750.0000 mg | ORAL_TABLET | Freq: Four times a day (QID) | ORAL | 1 refills | Status: DC | PRN
  Filled 2022-02-18: qty 90, 23d supply, fill #0
  Filled 2022-05-06: qty 90, 23d supply, fill #1

## 2022-02-19 DIAGNOSIS — I10 Essential (primary) hypertension: Secondary | ICD-10-CM | POA: Diagnosis not present

## 2022-03-02 ENCOUNTER — Other Ambulatory Visit: Payer: Self-pay

## 2022-03-07 ENCOUNTER — Other Ambulatory Visit (HOSPITAL_COMMUNITY): Payer: Self-pay

## 2022-03-09 ENCOUNTER — Other Ambulatory Visit (HOSPITAL_COMMUNITY): Payer: Self-pay

## 2022-03-09 MED ORDER — TRAMADOL HCL 50 MG PO TABS
50.0000 mg | ORAL_TABLET | Freq: Three times a day (TID) | ORAL | 0 refills | Status: DC | PRN
  Filled 2022-03-09: qty 90, 30d supply, fill #0

## 2022-03-10 ENCOUNTER — Other Ambulatory Visit (HOSPITAL_COMMUNITY): Payer: Self-pay

## 2022-03-19 DIAGNOSIS — H2513 Age-related nuclear cataract, bilateral: Secondary | ICD-10-CM | POA: Diagnosis not present

## 2022-03-19 DIAGNOSIS — H52223 Regular astigmatism, bilateral: Secondary | ICD-10-CM | POA: Diagnosis not present

## 2022-03-19 DIAGNOSIS — H25013 Cortical age-related cataract, bilateral: Secondary | ICD-10-CM | POA: Diagnosis not present

## 2022-03-19 DIAGNOSIS — H5213 Myopia, bilateral: Secondary | ICD-10-CM | POA: Diagnosis not present

## 2022-04-02 ENCOUNTER — Ambulatory Visit
Admission: RE | Admit: 2022-04-02 | Discharge: 2022-04-02 | Disposition: A | Discharge status: Home / Self Care | Payer: Commercial Managed Care - PPO | Source: Ambulatory Visit | Attending: Family Medicine | Admitting: Family Medicine

## 2022-04-02 DIAGNOSIS — Z1231 Encounter for screening mammogram for malignant neoplasm of breast: Secondary | ICD-10-CM

## 2022-04-06 ENCOUNTER — Other Ambulatory Visit: Payer: Self-pay | Admitting: Family Medicine

## 2022-04-06 DIAGNOSIS — R928 Other abnormal and inconclusive findings on diagnostic imaging of breast: Secondary | ICD-10-CM

## 2022-04-21 ENCOUNTER — Ambulatory Visit
Admission: RE | Admit: 2022-04-21 | Discharge: 2022-04-21 | Disposition: A | Discharge status: Home / Self Care | Payer: Commercial Managed Care - PPO | Source: Ambulatory Visit | Attending: Family Medicine | Admitting: Family Medicine

## 2022-04-21 DIAGNOSIS — R921 Mammographic calcification found on diagnostic imaging of breast: Secondary | ICD-10-CM | POA: Diagnosis not present

## 2022-04-21 DIAGNOSIS — R928 Other abnormal and inconclusive findings on diagnostic imaging of breast: Secondary | ICD-10-CM

## 2022-04-22 ENCOUNTER — Other Ambulatory Visit: Payer: Self-pay

## 2022-04-22 ENCOUNTER — Other Ambulatory Visit (HOSPITAL_COMMUNITY): Payer: Self-pay

## 2022-04-22 MED ORDER — SIMVASTATIN 40 MG PO TABS
40.0000 mg | ORAL_TABLET | Freq: Every evening | ORAL | 1 refills | Status: DC
  Filled 2022-04-22: qty 90, 90d supply, fill #0
  Filled 2022-07-22: qty 90, 90d supply, fill #1

## 2022-04-22 MED ORDER — HYDROCHLOROTHIAZIDE 25 MG PO TABS
25.0000 mg | ORAL_TABLET | Freq: Every morning | ORAL | 1 refills | Status: DC
  Filled 2022-04-22: qty 90, 90d supply, fill #0
  Filled 2022-07-22: qty 90, 90d supply, fill #1

## 2022-04-28 DIAGNOSIS — H2513 Age-related nuclear cataract, bilateral: Secondary | ICD-10-CM | POA: Diagnosis not present

## 2022-04-28 DIAGNOSIS — H18413 Arcus senilis, bilateral: Secondary | ICD-10-CM | POA: Diagnosis not present

## 2022-04-28 DIAGNOSIS — H2511 Age-related nuclear cataract, right eye: Secondary | ICD-10-CM | POA: Diagnosis not present

## 2022-04-28 DIAGNOSIS — H25013 Cortical age-related cataract, bilateral: Secondary | ICD-10-CM | POA: Diagnosis not present

## 2022-04-28 DIAGNOSIS — H25043 Posterior subcapsular polar age-related cataract, bilateral: Secondary | ICD-10-CM | POA: Diagnosis not present

## 2022-05-06 ENCOUNTER — Other Ambulatory Visit (HOSPITAL_COMMUNITY): Payer: Self-pay

## 2022-05-06 ENCOUNTER — Other Ambulatory Visit: Payer: Self-pay

## 2022-05-06 ENCOUNTER — Other Ambulatory Visit: Payer: Self-pay | Admitting: Orthopaedic Surgery

## 2022-05-06 MED ORDER — OMEPRAZOLE 20 MG PO CPDR
20.0000 mg | DELAYED_RELEASE_CAPSULE | Freq: Two times a day (BID) | ORAL | 0 refills | Status: DC
  Filled 2022-05-06: qty 180, 90d supply, fill #0

## 2022-05-06 MED ORDER — TRAMADOL HCL 50 MG PO TABS
50.0000 mg | ORAL_TABLET | Freq: Three times a day (TID) | ORAL | 0 refills | Status: DC | PRN
  Filled 2022-05-06: qty 90, 30d supply, fill #0

## 2022-05-06 MED ORDER — METHOCARBAMOL 750 MG PO TABS
750.0000 mg | ORAL_TABLET | Freq: Three times a day (TID) | ORAL | 1 refills | Status: DC | PRN
  Filled 2022-05-06: qty 60, 20d supply, fill #0

## 2022-05-06 MED ORDER — LOSARTAN POTASSIUM 100 MG PO TABS
100.0000 mg | ORAL_TABLET | Freq: Every day | ORAL | 0 refills | Status: DC
  Filled 2022-05-06: qty 90, 90d supply, fill #0

## 2022-05-07 ENCOUNTER — Other Ambulatory Visit (HOSPITAL_COMMUNITY): Payer: Self-pay

## 2022-05-11 ENCOUNTER — Other Ambulatory Visit (HOSPITAL_COMMUNITY): Payer: Self-pay

## 2022-05-11 MED ORDER — LEVOTHYROXINE SODIUM 25 MCG PO TABS
25.0000 ug | ORAL_TABLET | Freq: Every morning | ORAL | 1 refills | Status: DC
  Filled 2022-05-11: qty 90, 90d supply, fill #0
  Filled 2022-09-10: qty 90, 90d supply, fill #1

## 2022-05-12 ENCOUNTER — Other Ambulatory Visit (HOSPITAL_COMMUNITY): Payer: Self-pay

## 2022-05-21 ENCOUNTER — Other Ambulatory Visit (HOSPITAL_COMMUNITY): Payer: Self-pay

## 2022-05-21 MED ORDER — EZETIMIBE 10 MG PO TABS
10.0000 mg | ORAL_TABLET | Freq: Every day | ORAL | 0 refills | Status: DC
  Filled 2022-05-21: qty 90, 90d supply, fill #0

## 2022-06-17 ENCOUNTER — Other Ambulatory Visit: Payer: Self-pay

## 2022-06-17 ENCOUNTER — Other Ambulatory Visit (HOSPITAL_COMMUNITY): Payer: Self-pay

## 2022-06-17 MED ORDER — BESIVANCE 0.6 % OP SUSP
1.0000 [drp] | Freq: Three times a day (TID) | OPHTHALMIC | 1 refills | Status: DC
  Filled 2022-06-17: qty 5, 30d supply, fill #0
  Filled 2022-07-22: qty 5, 7d supply, fill #1
  Filled 2022-08-04: qty 5, 10d supply, fill #1

## 2022-06-17 MED ORDER — BROMFENAC SODIUM 0.07 % OP SOLN
1.0000 [drp] | Freq: Every evening | OPHTHALMIC | 1 refills | Status: DC
  Filled 2022-06-17: qty 3, 30d supply, fill #0
  Filled 2022-07-22 – 2022-08-04 (×3): qty 3, 30d supply, fill #1

## 2022-06-17 MED ORDER — DIFLUPREDNATE 0.05 % OP EMUL
1.0000 [drp] | Freq: Four times a day (QID) | OPHTHALMIC | 1 refills | Status: DC
  Filled 2022-06-17: qty 5, 25d supply, fill #0
  Filled 2022-07-22 – 2022-08-04 (×2): qty 5, 25d supply, fill #1

## 2022-06-18 ENCOUNTER — Other Ambulatory Visit: Payer: Self-pay

## 2022-06-18 ENCOUNTER — Other Ambulatory Visit (HOSPITAL_COMMUNITY): Payer: Self-pay

## 2022-06-19 ENCOUNTER — Other Ambulatory Visit (HOSPITAL_COMMUNITY): Payer: Self-pay

## 2022-06-30 ENCOUNTER — Other Ambulatory Visit (HOSPITAL_COMMUNITY): Payer: Self-pay

## 2022-07-01 ENCOUNTER — Other Ambulatory Visit (HOSPITAL_COMMUNITY): Payer: Self-pay

## 2022-07-02 ENCOUNTER — Other Ambulatory Visit (HOSPITAL_COMMUNITY): Payer: Self-pay

## 2022-07-02 MED ORDER — MELOXICAM 7.5 MG PO TABS
7.5000 mg | ORAL_TABLET | Freq: Every day | ORAL | 3 refills | Status: DC
  Filled 2022-07-02: qty 30, 30d supply, fill #0
  Filled 2022-07-27: qty 30, 30d supply, fill #1
  Filled 2022-09-10: qty 30, 30d supply, fill #2
  Filled 2022-10-31: qty 30, 30d supply, fill #3

## 2022-07-02 MED ORDER — TRAMADOL HCL 50 MG PO TABS
50.0000 mg | ORAL_TABLET | Freq: Three times a day (TID) | ORAL | 0 refills | Status: DC | PRN
  Filled 2022-07-02: qty 90, 30d supply, fill #0

## 2022-07-03 ENCOUNTER — Other Ambulatory Visit (HOSPITAL_COMMUNITY): Payer: Self-pay

## 2022-07-08 ENCOUNTER — Other Ambulatory Visit (HOSPITAL_COMMUNITY): Payer: Self-pay

## 2022-07-09 ENCOUNTER — Other Ambulatory Visit (HOSPITAL_COMMUNITY): Payer: Self-pay

## 2022-07-09 MED ORDER — CARVEDILOL 12.5 MG PO TABS
12.5000 mg | ORAL_TABLET | Freq: Two times a day (BID) | ORAL | 1 refills | Status: DC
  Filled 2022-07-09 – 2023-02-23 (×4): qty 180, 90d supply, fill #0
  Filled 2023-06-09: qty 180, 90d supply, fill #1

## 2022-07-15 DIAGNOSIS — H2511 Age-related nuclear cataract, right eye: Secondary | ICD-10-CM | POA: Diagnosis not present

## 2022-07-15 HISTORY — PX: EYE SURGERY: SHX253

## 2022-07-16 ENCOUNTER — Other Ambulatory Visit: Payer: Self-pay

## 2022-07-16 ENCOUNTER — Other Ambulatory Visit (HOSPITAL_COMMUNITY): Payer: Self-pay

## 2022-07-16 DIAGNOSIS — H2512 Age-related nuclear cataract, left eye: Secondary | ICD-10-CM | POA: Diagnosis not present

## 2022-07-16 MED ORDER — BROMFENAC SODIUM 0.07 % OP SOLN
1.0000 [drp] | Freq: Every evening | OPHTHALMIC | 1 refills | Status: DC
  Filled 2022-07-16: qty 3, 30d supply, fill #0

## 2022-07-16 MED ORDER — DIFLUPREDNATE 0.05 % OP EMUL
1.0000 [drp] | Freq: Four times a day (QID) | OPHTHALMIC | 1 refills | Status: DC
  Filled 2022-07-16: qty 5, 25d supply, fill #0

## 2022-07-16 MED ORDER — BESIVANCE 0.6 % OP SUSP
1.0000 [drp] | Freq: Three times a day (TID) | OPHTHALMIC | 1 refills | Status: DC
  Filled 2022-07-16: qty 5, 30d supply, fill #0

## 2022-07-17 ENCOUNTER — Other Ambulatory Visit (HOSPITAL_COMMUNITY): Payer: Self-pay

## 2022-07-17 ENCOUNTER — Other Ambulatory Visit: Payer: Self-pay

## 2022-07-20 ENCOUNTER — Other Ambulatory Visit (HOSPITAL_COMMUNITY): Payer: Self-pay

## 2022-07-20 DIAGNOSIS — H2512 Age-related nuclear cataract, left eye: Secondary | ICD-10-CM | POA: Diagnosis not present

## 2022-07-20 DIAGNOSIS — H5201 Hypermetropia, right eye: Secondary | ICD-10-CM | POA: Diagnosis not present

## 2022-07-20 DIAGNOSIS — H25012 Cortical age-related cataract, left eye: Secondary | ICD-10-CM | POA: Diagnosis not present

## 2022-07-20 DIAGNOSIS — H2511 Age-related nuclear cataract, right eye: Secondary | ICD-10-CM | POA: Diagnosis not present

## 2022-07-20 DIAGNOSIS — H25011 Cortical age-related cataract, right eye: Secondary | ICD-10-CM | POA: Diagnosis not present

## 2022-07-20 DIAGNOSIS — H5212 Myopia, left eye: Secondary | ICD-10-CM | POA: Diagnosis not present

## 2022-07-22 ENCOUNTER — Other Ambulatory Visit (HOSPITAL_COMMUNITY): Payer: Self-pay

## 2022-07-24 ENCOUNTER — Other Ambulatory Visit (HOSPITAL_COMMUNITY): Payer: Self-pay

## 2022-07-24 ENCOUNTER — Other Ambulatory Visit: Payer: Self-pay

## 2022-07-27 ENCOUNTER — Other Ambulatory Visit (HOSPITAL_COMMUNITY): Payer: Self-pay

## 2022-07-27 ENCOUNTER — Other Ambulatory Visit: Payer: Self-pay

## 2022-07-29 DIAGNOSIS — H2512 Age-related nuclear cataract, left eye: Secondary | ICD-10-CM | POA: Diagnosis not present

## 2022-07-30 ENCOUNTER — Other Ambulatory Visit (HOSPITAL_COMMUNITY): Payer: Self-pay

## 2022-08-03 DIAGNOSIS — Z961 Presence of intraocular lens: Secondary | ICD-10-CM | POA: Diagnosis not present

## 2022-08-03 DIAGNOSIS — H5201 Hypermetropia, right eye: Secondary | ICD-10-CM | POA: Diagnosis not present

## 2022-08-03 DIAGNOSIS — H20042 Secondary noninfectious iridocyclitis, left eye: Secondary | ICD-10-CM | POA: Diagnosis not present

## 2022-08-03 DIAGNOSIS — H5212 Myopia, left eye: Secondary | ICD-10-CM | POA: Diagnosis not present

## 2022-08-03 DIAGNOSIS — H25012 Cortical age-related cataract, left eye: Secondary | ICD-10-CM | POA: Diagnosis not present

## 2022-08-03 DIAGNOSIS — H2512 Age-related nuclear cataract, left eye: Secondary | ICD-10-CM | POA: Diagnosis not present

## 2022-08-04 ENCOUNTER — Other Ambulatory Visit (HOSPITAL_COMMUNITY): Payer: Self-pay

## 2022-08-04 MED ORDER — AMLODIPINE BESYLATE 10 MG PO TABS
10.0000 mg | ORAL_TABLET | Freq: Every day | ORAL | 1 refills | Status: DC
  Filled 2022-08-04: qty 90, 90d supply, fill #0
  Filled 2022-08-12 – 2022-10-31 (×2): qty 90, 90d supply, fill #1

## 2022-08-05 ENCOUNTER — Other Ambulatory Visit (HOSPITAL_COMMUNITY): Payer: Self-pay

## 2022-08-05 DIAGNOSIS — N189 Chronic kidney disease, unspecified: Secondary | ICD-10-CM | POA: Diagnosis not present

## 2022-08-05 DIAGNOSIS — E039 Hypothyroidism, unspecified: Secondary | ICD-10-CM | POA: Diagnosis not present

## 2022-08-05 DIAGNOSIS — E7800 Pure hypercholesterolemia, unspecified: Secondary | ICD-10-CM | POA: Diagnosis not present

## 2022-08-05 DIAGNOSIS — I10 Essential (primary) hypertension: Secondary | ICD-10-CM | POA: Diagnosis not present

## 2022-08-05 DIAGNOSIS — Z0189 Encounter for other specified special examinations: Secondary | ICD-10-CM | POA: Diagnosis not present

## 2022-08-07 ENCOUNTER — Other Ambulatory Visit (HOSPITAL_COMMUNITY): Payer: Self-pay

## 2022-08-12 ENCOUNTER — Other Ambulatory Visit (HOSPITAL_COMMUNITY): Payer: Self-pay

## 2022-08-12 MED ORDER — OMEPRAZOLE 20 MG PO CPDR
20.0000 mg | DELAYED_RELEASE_CAPSULE | Freq: Two times a day (BID) | ORAL | 1 refills | Status: DC
  Filled 2022-08-12: qty 180, 90d supply, fill #0
  Filled 2022-11-08: qty 180, 90d supply, fill #1

## 2022-08-23 ENCOUNTER — Other Ambulatory Visit (HOSPITAL_COMMUNITY): Payer: Self-pay

## 2022-08-24 ENCOUNTER — Other Ambulatory Visit (HOSPITAL_COMMUNITY): Payer: Self-pay

## 2022-08-24 MED ORDER — LOSARTAN POTASSIUM 100 MG PO TABS
100.0000 mg | ORAL_TABLET | Freq: Every day | ORAL | 3 refills | Status: DC
  Filled 2022-08-24: qty 90, 90d supply, fill #0
  Filled 2023-02-23: qty 90, 90d supply, fill #1
  Filled 2023-06-09: qty 90, 90d supply, fill #2

## 2022-08-24 MED ORDER — TRAMADOL HCL 50 MG PO TABS
50.0000 mg | ORAL_TABLET | Freq: Three times a day (TID) | ORAL | 0 refills | Status: DC | PRN
  Filled 2022-08-24: qty 90, 30d supply, fill #0

## 2022-08-24 MED ORDER — METHOCARBAMOL 750 MG PO TABS
750.0000 mg | ORAL_TABLET | Freq: Four times a day (QID) | ORAL | 0 refills | Status: DC | PRN
  Filled 2022-08-24: qty 90, 23d supply, fill #0

## 2022-08-25 ENCOUNTER — Other Ambulatory Visit (HOSPITAL_COMMUNITY): Payer: Self-pay

## 2022-09-10 ENCOUNTER — Other Ambulatory Visit (HOSPITAL_COMMUNITY): Payer: Self-pay

## 2022-09-10 ENCOUNTER — Other Ambulatory Visit: Payer: Self-pay

## 2022-09-10 MED ORDER — EZETIMIBE 10 MG PO TABS
10.0000 mg | ORAL_TABLET | Freq: Every day | ORAL | 1 refills | Status: DC
  Filled 2022-09-10: qty 90, 90d supply, fill #0
  Filled 2022-10-31 – 2023-01-27 (×2): qty 90, 90d supply, fill #1

## 2022-09-28 ENCOUNTER — Other Ambulatory Visit (HOSPITAL_COMMUNITY): Payer: Self-pay

## 2022-09-29 ENCOUNTER — Other Ambulatory Visit (HOSPITAL_COMMUNITY): Payer: Self-pay

## 2022-09-29 MED ORDER — TRAMADOL HCL 50 MG PO TABS
50.0000 mg | ORAL_TABLET | Freq: Three times a day (TID) | ORAL | 0 refills | Status: DC | PRN
  Filled 2022-09-29: qty 90, 30d supply, fill #0

## 2022-10-01 ENCOUNTER — Other Ambulatory Visit (HOSPITAL_COMMUNITY): Payer: Self-pay

## 2022-10-31 ENCOUNTER — Other Ambulatory Visit (HOSPITAL_COMMUNITY): Payer: Self-pay

## 2022-11-02 ENCOUNTER — Other Ambulatory Visit (HOSPITAL_COMMUNITY): Payer: Self-pay

## 2022-11-02 ENCOUNTER — Other Ambulatory Visit: Payer: Self-pay

## 2022-11-02 MED ORDER — METHOCARBAMOL 750 MG PO TABS
750.0000 mg | ORAL_TABLET | Freq: Four times a day (QID) | ORAL | 0 refills | Status: DC | PRN
  Filled 2022-11-02: qty 90, 23d supply, fill #0

## 2022-11-02 MED ORDER — SIMVASTATIN 40 MG PO TABS
40.0000 mg | ORAL_TABLET | Freq: Every evening | ORAL | 1 refills | Status: DC
  Filled 2022-11-02: qty 90, 90d supply, fill #0
  Filled 2023-02-23: qty 90, 90d supply, fill #1

## 2022-11-04 ENCOUNTER — Other Ambulatory Visit (HOSPITAL_COMMUNITY): Payer: Self-pay

## 2022-11-08 ENCOUNTER — Other Ambulatory Visit (HOSPITAL_COMMUNITY): Payer: Self-pay

## 2022-11-09 ENCOUNTER — Other Ambulatory Visit: Payer: Self-pay

## 2022-11-09 ENCOUNTER — Other Ambulatory Visit (HOSPITAL_COMMUNITY): Payer: Self-pay

## 2022-11-09 MED ORDER — HYDROCHLOROTHIAZIDE 25 MG PO TABS
25.0000 mg | ORAL_TABLET | Freq: Every morning | ORAL | 1 refills | Status: DC
  Filled 2022-11-09: qty 90, 90d supply, fill #0
  Filled 2023-02-23: qty 90, 90d supply, fill #1

## 2022-11-13 ENCOUNTER — Other Ambulatory Visit (HOSPITAL_COMMUNITY): Payer: Self-pay

## 2022-11-19 ENCOUNTER — Other Ambulatory Visit (HOSPITAL_COMMUNITY): Payer: Self-pay

## 2022-11-19 MED ORDER — LOSARTAN POTASSIUM 100 MG PO TABS
100.0000 mg | ORAL_TABLET | Freq: Every day | ORAL | 1 refills | Status: DC
  Filled 2022-11-19: qty 90, 90d supply, fill #0
  Filled 2023-09-30: qty 90, 90d supply, fill #1

## 2022-11-20 ENCOUNTER — Other Ambulatory Visit (HOSPITAL_COMMUNITY): Payer: Self-pay

## 2022-11-20 MED ORDER — TRAMADOL HCL 50 MG PO TABS
50.0000 mg | ORAL_TABLET | Freq: Three times a day (TID) | ORAL | 0 refills | Status: DC | PRN
  Filled 2022-11-20: qty 90, 30d supply, fill #0

## 2022-12-03 ENCOUNTER — Other Ambulatory Visit (HOSPITAL_COMMUNITY): Payer: Self-pay

## 2022-12-08 ENCOUNTER — Other Ambulatory Visit (HOSPITAL_COMMUNITY): Payer: Self-pay

## 2022-12-08 MED ORDER — LEVOTHYROXINE SODIUM 25 MCG PO TABS
25.0000 ug | ORAL_TABLET | Freq: Every morning | ORAL | 1 refills | Status: DC
  Filled 2022-12-08: qty 90, 90d supply, fill #0
  Filled 2023-03-22: qty 90, 90d supply, fill #1

## 2023-01-27 ENCOUNTER — Other Ambulatory Visit (HOSPITAL_COMMUNITY): Payer: Self-pay

## 2023-01-28 ENCOUNTER — Other Ambulatory Visit: Payer: Self-pay

## 2023-01-28 ENCOUNTER — Other Ambulatory Visit (HOSPITAL_COMMUNITY): Payer: Self-pay

## 2023-01-28 MED ORDER — MELOXICAM 7.5 MG PO TABS
7.5000 mg | ORAL_TABLET | Freq: Every day | ORAL | 3 refills | Status: DC
  Filled 2023-01-28: qty 30, 30d supply, fill #0
  Filled 2023-03-22: qty 30, 30d supply, fill #1
  Filled 2023-05-04: qty 30, 30d supply, fill #2
  Filled 2023-06-09: qty 30, 30d supply, fill #3

## 2023-01-28 MED ORDER — TRAMADOL HCL 50 MG PO TABS
50.0000 mg | ORAL_TABLET | Freq: Three times a day (TID) | ORAL | 0 refills | Status: DC | PRN
  Filled 2023-01-28: qty 90, 30d supply, fill #0

## 2023-01-28 MED ORDER — METHOCARBAMOL 750 MG PO TABS
750.0000 mg | ORAL_TABLET | Freq: Four times a day (QID) | ORAL | 0 refills | Status: DC | PRN
  Filled 2023-01-28: qty 90, 23d supply, fill #0

## 2023-02-03 DIAGNOSIS — I10 Essential (primary) hypertension: Secondary | ICD-10-CM | POA: Diagnosis not present

## 2023-02-03 DIAGNOSIS — E7800 Pure hypercholesterolemia, unspecified: Secondary | ICD-10-CM | POA: Diagnosis not present

## 2023-02-03 DIAGNOSIS — E559 Vitamin D deficiency, unspecified: Secondary | ICD-10-CM | POA: Diagnosis not present

## 2023-02-03 DIAGNOSIS — E039 Hypothyroidism, unspecified: Secondary | ICD-10-CM | POA: Diagnosis not present

## 2023-02-03 DIAGNOSIS — R7303 Prediabetes: Secondary | ICD-10-CM | POA: Diagnosis not present

## 2023-02-09 DIAGNOSIS — N189 Chronic kidney disease, unspecified: Secondary | ICD-10-CM | POA: Diagnosis not present

## 2023-02-09 DIAGNOSIS — E039 Hypothyroidism, unspecified: Secondary | ICD-10-CM | POA: Diagnosis not present

## 2023-02-09 DIAGNOSIS — I10 Essential (primary) hypertension: Secondary | ICD-10-CM | POA: Diagnosis not present

## 2023-02-09 DIAGNOSIS — K219 Gastro-esophageal reflux disease without esophagitis: Secondary | ICD-10-CM | POA: Diagnosis not present

## 2023-02-09 DIAGNOSIS — R7303 Prediabetes: Secondary | ICD-10-CM | POA: Diagnosis not present

## 2023-02-09 DIAGNOSIS — E7800 Pure hypercholesterolemia, unspecified: Secondary | ICD-10-CM | POA: Diagnosis not present

## 2023-02-09 DIAGNOSIS — E559 Vitamin D deficiency, unspecified: Secondary | ICD-10-CM | POA: Diagnosis not present

## 2023-02-23 ENCOUNTER — Other Ambulatory Visit: Payer: Self-pay

## 2023-02-23 ENCOUNTER — Other Ambulatory Visit (HOSPITAL_COMMUNITY): Payer: Self-pay

## 2023-02-23 MED ORDER — OMEPRAZOLE 20 MG PO CPDR
20.0000 mg | DELAYED_RELEASE_CAPSULE | Freq: Two times a day (BID) | ORAL | 1 refills | Status: DC
  Filled 2023-02-23: qty 180, 90d supply, fill #0
  Filled 2023-06-09: qty 180, 90d supply, fill #1

## 2023-02-23 MED ORDER — AMLODIPINE BESYLATE 10 MG PO TABS
10.0000 mg | ORAL_TABLET | Freq: Every day | ORAL | 1 refills | Status: DC
  Filled 2023-02-23: qty 90, 90d supply, fill #0
  Filled 2023-06-09: qty 90, 90d supply, fill #1

## 2023-03-22 ENCOUNTER — Other Ambulatory Visit (HOSPITAL_COMMUNITY): Payer: Self-pay

## 2023-03-22 ENCOUNTER — Other Ambulatory Visit: Payer: Self-pay

## 2023-03-23 ENCOUNTER — Other Ambulatory Visit (HOSPITAL_COMMUNITY): Payer: Self-pay

## 2023-03-23 MED ORDER — TRAMADOL HCL 50 MG PO TABS
50.0000 mg | ORAL_TABLET | Freq: Three times a day (TID) | ORAL | 0 refills | Status: DC | PRN
  Filled 2023-03-23: qty 90, 30d supply, fill #0

## 2023-03-23 MED ORDER — METHOCARBAMOL 750 MG PO TABS
750.0000 mg | ORAL_TABLET | Freq: Four times a day (QID) | ORAL | 0 refills | Status: DC | PRN
  Filled 2023-03-23: qty 90, 30d supply, fill #0

## 2023-03-25 ENCOUNTER — Other Ambulatory Visit (HOSPITAL_COMMUNITY): Payer: Self-pay

## 2023-04-20 ENCOUNTER — Other Ambulatory Visit: Payer: Self-pay | Admitting: Family Medicine

## 2023-04-20 DIAGNOSIS — Z1231 Encounter for screening mammogram for malignant neoplasm of breast: Secondary | ICD-10-CM

## 2023-04-22 ENCOUNTER — Other Ambulatory Visit (HOSPITAL_COMMUNITY): Payer: Self-pay

## 2023-04-22 DIAGNOSIS — R21 Rash and other nonspecific skin eruption: Secondary | ICD-10-CM | POA: Diagnosis not present

## 2023-04-22 DIAGNOSIS — M1990 Unspecified osteoarthritis, unspecified site: Secondary | ICD-10-CM | POA: Diagnosis not present

## 2023-04-22 DIAGNOSIS — I10 Essential (primary) hypertension: Secondary | ICD-10-CM | POA: Diagnosis not present

## 2023-04-22 MED ORDER — KETOCONAZOLE 200 MG PO TABS
200.0000 mg | ORAL_TABLET | Freq: Every day | ORAL | 0 refills | Status: DC
  Filled 2023-04-22: qty 10, 10d supply, fill #0

## 2023-04-27 ENCOUNTER — Other Ambulatory Visit (HOSPITAL_COMMUNITY): Payer: Self-pay

## 2023-04-29 ENCOUNTER — Ambulatory Visit
Admission: RE | Admit: 2023-04-29 | Discharge: 2023-04-29 | Disposition: A | Discharge status: Home / Self Care | Source: Ambulatory Visit | Attending: Family Medicine | Admitting: Family Medicine

## 2023-04-29 DIAGNOSIS — Z1231 Encounter for screening mammogram for malignant neoplasm of breast: Secondary | ICD-10-CM | POA: Diagnosis not present

## 2023-05-04 ENCOUNTER — Other Ambulatory Visit (HOSPITAL_COMMUNITY): Payer: Self-pay

## 2023-05-04 ENCOUNTER — Other Ambulatory Visit: Payer: Self-pay

## 2023-05-04 MED ORDER — EZETIMIBE 10 MG PO TABS
10.0000 mg | ORAL_TABLET | Freq: Every day | ORAL | 1 refills | Status: DC
  Filled 2023-05-04: qty 90, 90d supply, fill #0
  Filled 2023-08-12: qty 90, 90d supply, fill #1

## 2023-05-10 ENCOUNTER — Other Ambulatory Visit (HOSPITAL_COMMUNITY): Payer: Self-pay

## 2023-05-11 ENCOUNTER — Other Ambulatory Visit (HOSPITAL_COMMUNITY): Payer: Self-pay

## 2023-05-11 MED ORDER — TRAMADOL HCL 50 MG PO TABS
50.0000 mg | ORAL_TABLET | Freq: Three times a day (TID) | ORAL | 0 refills | Status: DC | PRN
  Filled 2023-05-11: qty 90, 30d supply, fill #0

## 2023-05-11 MED ORDER — METHOCARBAMOL 750 MG PO TABS
750.0000 mg | ORAL_TABLET | Freq: Four times a day (QID) | ORAL | 0 refills | Status: DC | PRN
  Filled 2023-05-11: qty 90, 23d supply, fill #0

## 2023-06-09 ENCOUNTER — Other Ambulatory Visit: Payer: Self-pay

## 2023-06-09 ENCOUNTER — Other Ambulatory Visit (HOSPITAL_COMMUNITY): Payer: Self-pay

## 2023-06-09 MED ORDER — SIMVASTATIN 40 MG PO TABS
40.0000 mg | ORAL_TABLET | Freq: Every evening | ORAL | 1 refills | Status: DC
  Filled 2023-06-09: qty 90, 90d supply, fill #0
  Filled 2023-09-30: qty 90, 90d supply, fill #1

## 2023-06-09 MED ORDER — HYDROCHLOROTHIAZIDE 25 MG PO TABS
25.0000 mg | ORAL_TABLET | Freq: Every morning | ORAL | 2 refills | Status: DC
  Filled 2023-06-09: qty 90, 90d supply, fill #0

## 2023-06-23 ENCOUNTER — Other Ambulatory Visit (HOSPITAL_COMMUNITY): Payer: Self-pay

## 2023-06-23 MED ORDER — LEVOTHYROXINE SODIUM 25 MCG PO TABS
25.0000 ug | ORAL_TABLET | Freq: Every day | ORAL | 2 refills | Status: DC
  Filled 2023-06-23: qty 90, 90d supply, fill #0

## 2023-06-23 MED ORDER — TRAMADOL HCL 50 MG PO TABS
50.0000 mg | ORAL_TABLET | Freq: Three times a day (TID) | ORAL | 0 refills | Status: DC | PRN
  Filled 2023-06-23: qty 90, 30d supply, fill #0

## 2023-07-13 ENCOUNTER — Other Ambulatory Visit (HOSPITAL_COMMUNITY): Payer: Self-pay

## 2023-07-14 ENCOUNTER — Other Ambulatory Visit: Payer: Self-pay

## 2023-07-14 ENCOUNTER — Other Ambulatory Visit (HOSPITAL_COMMUNITY): Payer: Self-pay

## 2023-07-14 MED ORDER — METHOCARBAMOL 750 MG PO TABS
750.0000 mg | ORAL_TABLET | Freq: Four times a day (QID) | ORAL | 0 refills | Status: DC | PRN
  Filled 2023-07-14: qty 90, 23d supply, fill #0

## 2023-08-12 ENCOUNTER — Other Ambulatory Visit: Payer: Self-pay

## 2023-08-12 ENCOUNTER — Other Ambulatory Visit (HOSPITAL_COMMUNITY): Payer: Self-pay

## 2023-08-12 MED ORDER — MELOXICAM 7.5 MG PO TABS
7.5000 mg | ORAL_TABLET | Freq: Every day | ORAL | 1 refills | Status: DC
  Filled 2023-08-12: qty 30, 30d supply, fill #0

## 2023-08-16 ENCOUNTER — Other Ambulatory Visit (HOSPITAL_COMMUNITY): Payer: Self-pay

## 2023-08-16 MED ORDER — METHOCARBAMOL 750 MG PO TABS
750.0000 mg | ORAL_TABLET | Freq: Four times a day (QID) | ORAL | 0 refills | Status: DC | PRN
  Filled 2023-08-16: qty 90, 23d supply, fill #0

## 2023-08-17 ENCOUNTER — Other Ambulatory Visit (HOSPITAL_COMMUNITY): Payer: Self-pay

## 2023-08-17 MED ORDER — TRAMADOL HCL 50 MG PO TABS
50.0000 mg | ORAL_TABLET | Freq: Three times a day (TID) | ORAL | 0 refills | Status: DC | PRN
  Filled 2023-08-17: qty 90, 30d supply, fill #0

## 2023-09-07 DIAGNOSIS — I10 Essential (primary) hypertension: Secondary | ICD-10-CM | POA: Diagnosis not present

## 2023-09-07 DIAGNOSIS — E559 Vitamin D deficiency, unspecified: Secondary | ICD-10-CM | POA: Diagnosis not present

## 2023-09-07 DIAGNOSIS — R7303 Prediabetes: Secondary | ICD-10-CM | POA: Diagnosis not present

## 2023-09-07 DIAGNOSIS — E039 Hypothyroidism, unspecified: Secondary | ICD-10-CM | POA: Diagnosis not present

## 2023-09-07 DIAGNOSIS — E7800 Pure hypercholesterolemia, unspecified: Secondary | ICD-10-CM | POA: Diagnosis not present

## 2023-09-09 ENCOUNTER — Other Ambulatory Visit (HOSPITAL_COMMUNITY): Payer: Self-pay

## 2023-09-09 DIAGNOSIS — N189 Chronic kidney disease, unspecified: Secondary | ICD-10-CM | POA: Diagnosis not present

## 2023-09-09 DIAGNOSIS — E559 Vitamin D deficiency, unspecified: Secondary | ICD-10-CM | POA: Diagnosis not present

## 2023-09-09 DIAGNOSIS — E7800 Pure hypercholesterolemia, unspecified: Secondary | ICD-10-CM | POA: Diagnosis not present

## 2023-09-09 DIAGNOSIS — I10 Essential (primary) hypertension: Secondary | ICD-10-CM | POA: Diagnosis not present

## 2023-09-09 DIAGNOSIS — R7303 Prediabetes: Secondary | ICD-10-CM | POA: Diagnosis not present

## 2023-09-09 DIAGNOSIS — E039 Hypothyroidism, unspecified: Secondary | ICD-10-CM | POA: Diagnosis not present

## 2023-09-09 DIAGNOSIS — R21 Rash and other nonspecific skin eruption: Secondary | ICD-10-CM | POA: Diagnosis not present

## 2023-09-09 DIAGNOSIS — M5137 Other intervertebral disc degeneration, lumbosacral region with discogenic back pain only: Secondary | ICD-10-CM | POA: Diagnosis not present

## 2023-09-09 DIAGNOSIS — Z0189 Encounter for other specified special examinations: Secondary | ICD-10-CM | POA: Diagnosis not present

## 2023-09-09 MED ORDER — LEVOTHYROXINE SODIUM 50 MCG PO TABS
50.0000 ug | ORAL_TABLET | Freq: Every morning | ORAL | 3 refills | Status: AC
  Filled 2023-09-09: qty 90, 90d supply, fill #0
  Filled 2023-12-10: qty 90, 90d supply, fill #1
  Filled 2023-12-15: qty 90, 90d supply, fill #0

## 2023-09-14 ENCOUNTER — Other Ambulatory Visit (HOSPITAL_COMMUNITY): Payer: Self-pay

## 2023-09-30 ENCOUNTER — Ambulatory Visit: Admitting: Physician Assistant

## 2023-09-30 ENCOUNTER — Telehealth: Payer: Self-pay | Admitting: Physical Medicine and Rehabilitation

## 2023-09-30 ENCOUNTER — Other Ambulatory Visit: Payer: Self-pay | Admitting: Orthopaedic Surgery

## 2023-09-30 ENCOUNTER — Ambulatory Visit: Admitting: Sports Medicine

## 2023-09-30 ENCOUNTER — Other Ambulatory Visit (HOSPITAL_COMMUNITY): Payer: Self-pay

## 2023-09-30 ENCOUNTER — Ambulatory Visit (INDEPENDENT_AMBULATORY_CARE_PROVIDER_SITE_OTHER): Payer: Self-pay

## 2023-09-30 ENCOUNTER — Encounter: Payer: Self-pay | Admitting: Sports Medicine

## 2023-09-30 ENCOUNTER — Other Ambulatory Visit: Payer: Self-pay

## 2023-09-30 VITALS — Ht 67.32 in | Wt 228.8 lb

## 2023-09-30 DIAGNOSIS — M1611 Unilateral primary osteoarthritis, right hip: Secondary | ICD-10-CM

## 2023-09-30 DIAGNOSIS — M25551 Pain in right hip: Secondary | ICD-10-CM | POA: Diagnosis not present

## 2023-09-30 MED ORDER — METHYLPREDNISOLONE ACETATE 40 MG/ML IJ SUSP
80.0000 mg | INTRAMUSCULAR | Status: AC | PRN
  Administered 2023-09-30: 80 mg via INTRA_ARTICULAR

## 2023-09-30 MED ORDER — LIDOCAINE HCL 1 % IJ SOLN
4.0000 mL | INTRAMUSCULAR | Status: AC | PRN
  Administered 2023-09-30: 4 mL

## 2023-09-30 NOTE — Progress Notes (Signed)
   Procedure Note  Patient: Theresa Nolan             Date of Birth: Aug 17, 1955           MRN: [id number]             Visit Date: 09/30/2023  Procedures: Visit Diagnoses:  1. Unilateral primary osteoarthritis, right hip   2. Pain in right hip    Large Joint Inj: R hip joint on 09/30/2023 2:08 PM Indications: pain Details: 22 G 3.5 in needle, ultrasound-guided anterior approach Medications: 4 mL lidocaine  1 %; 80 mg methylPREDNISolone  acetate 40 MG/ML Outcome: tolerated well, no immediate complications  Procedure: US -guided intra-articular hip injection, right After discussion on risks/benefits/indications and informed verbal consent was obtained, a timeout was performed. Patient was lying supine on exam table. The hip was cleaned with betadine and alcohol swabs. Then utilizing ultrasound guidance, the patient's femoral head and neck junction was identified and subsequently injected with 4:2 lidocaine :depomedrol via an in-plane approach with ultrasound visualization of the injectate administered into the hip joint. Patient tolerated procedure well without immediate complications.  Procedure, treatment alternatives, risks and benefits explained, specific risks discussed. Consent was given by the patient. Immediately prior to procedure a time out was called to verify the correct patient, procedure, equipment, support staff and site/side marked as required. Patient was prepped and draped in the usual sterile fashion.     - patient tolerated procedure well, discussed post-injection protocol - follow-up with Tory Gaskins, PAC as indicated; I am happy to see them as needed  Lonell Sprang, DO Primary Care Sports Medicine Physician  [facility] - Orthoped[facility]  This note was dictated using Dragon naturally speaking software and may contain errors in syntax, spelling, or content which have not been identified prior to signing this note.

## 2023-09-30 NOTE — Progress Notes (Signed)
 HPI: Theresa Nolan returns today complaining of left hip pain.  States left hip pain has been worse over the last 3 months.  She describes the pain to be in her groin no radicular symptoms.  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  No injuries.  She has problems donning shoes socks.  She also has difficulty getting up from a low seated position.  She is using no assistive device to ambulate.  History of left total hip arthroplasty 12/28/2017 which is doing well.  She is wondering if she could get cortisone injection into the right hip or take oral prednisone .  Review of systems: Negative for fevers chills.  Physical exam: General Well-developed well-nourished female no acute distress able to get on and off the exam table on her own.  No assistive device to ambulate. Psych: Alert and oriented x 3 Respirations: Unlabored Bilateral hips: Excellent range of motion left hip without pain.  Circumflex from the left hip shows excellent motion.  Right hip pain with external rotation slightly limited and painful internal rotation.  Radiographs: AP pelvis lateral view right hip: Bilateral hips well located.  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  No acute fracture or acute findings.  Right hip with moderate moderate to severe narrowing with impingement secondary to superior acetabular osteophyte.  No bony abnormalities otherwise.  Impression: Right hip osteoarthritis  Plan: Per her request we will have her undergo intra-articular injection with Dr. Burnetta of the right hip.  She understands she needs to wait at least 2 months before undergoing total hip replacement even if pain increases.  She will follow-up with in 6 weeks to see what type of response she had to the injection.  Questions were encouraged and answered at length.

## 2023-09-30 NOTE — Telephone Encounter (Signed)
 Patient came in and wants to schedule for an back injection. CB#[id number]

## 2023-10-01 ENCOUNTER — Encounter (HOSPITAL_COMMUNITY): Payer: Self-pay

## 2023-10-01 ENCOUNTER — Other Ambulatory Visit (HOSPITAL_COMMUNITY): Payer: Self-pay

## 2023-10-01 ENCOUNTER — Other Ambulatory Visit: Payer: Self-pay

## 2023-10-01 MED ORDER — AMLODIPINE BESYLATE 10 MG PO TABS
10.0000 mg | ORAL_TABLET | Freq: Every day | ORAL | 1 refills | Status: AC
  Filled 2023-10-01: qty 90, 90d supply, fill #0
  Filled 2023-12-25: qty 90, 90d supply, fill #1

## 2023-10-01 MED ORDER — EZETIMIBE 10 MG PO TABS
10.0000 mg | ORAL_TABLET | Freq: Every day | ORAL | 2 refills | Status: AC
  Filled 2023-12-10 – 2023-12-15 (×2): qty 90, 90d supply, fill #0

## 2023-10-01 MED ORDER — TRAMADOL HCL 50 MG PO TABS
50.0000 mg | ORAL_TABLET | Freq: Three times a day (TID) | ORAL | 0 refills | Status: DC | PRN
  Filled 2023-10-01: qty 90, 30d supply, fill #0

## 2023-10-01 MED ORDER — METHOCARBAMOL 750 MG PO TABS
750.0000 mg | ORAL_TABLET | Freq: Three times a day (TID) | ORAL | 1 refills | Status: DC | PRN
  Filled 2023-10-01: qty 60, 20d supply, fill #0
  Filled 2023-10-01: qty 13, 5d supply, fill #0
  Filled 2023-10-01: qty 47, 15d supply, fill #0
  Filled 2023-11-23: qty 60, 20d supply, fill #1

## 2023-10-01 MED ORDER — CARVEDILOL 12.5 MG PO TABS
12.5000 mg | ORAL_TABLET | Freq: Two times a day (BID) | ORAL | 1 refills | Status: AC
  Filled 2023-10-01: qty 180, 90d supply, fill #0
  Filled 2023-12-25: qty 180, 90d supply, fill #1

## 2023-10-04 DIAGNOSIS — E039 Hypothyroidism, unspecified: Secondary | ICD-10-CM | POA: Diagnosis not present

## 2023-10-04 DIAGNOSIS — N1831 Chronic kidney disease, stage 3a: Secondary | ICD-10-CM | POA: Diagnosis not present

## 2023-10-04 DIAGNOSIS — N179 Acute kidney failure, unspecified: Secondary | ICD-10-CM | POA: Diagnosis not present

## 2023-10-04 DIAGNOSIS — N184 Chronic kidney disease, stage 4 (severe): Secondary | ICD-10-CM | POA: Diagnosis not present

## 2023-10-04 DIAGNOSIS — I129 Hypertensive chronic kidney disease with stage 1 through stage 4 chronic kidney disease, or unspecified chronic kidney disease: Secondary | ICD-10-CM | POA: Diagnosis not present

## 2023-10-04 DIAGNOSIS — E669 Obesity, unspecified: Secondary | ICD-10-CM | POA: Diagnosis not present

## 2023-10-04 DIAGNOSIS — E7800 Pure hypercholesterolemia, unspecified: Secondary | ICD-10-CM | POA: Diagnosis not present

## 2023-10-04 DIAGNOSIS — R7309 Other abnormal glucose: Secondary | ICD-10-CM | POA: Diagnosis not present

## 2023-10-06 ENCOUNTER — Other Ambulatory Visit: Payer: Self-pay | Admitting: Nephrology

## 2023-10-06 DIAGNOSIS — N1831 Chronic kidney disease, stage 3a: Secondary | ICD-10-CM

## 2023-10-08 ENCOUNTER — Encounter: Payer: Self-pay | Admitting: Nephrology

## 2023-10-12 ENCOUNTER — Ambulatory Visit
Admission: RE | Admit: 2023-10-12 | Discharge: 2023-10-12 | Disposition: A | Discharge status: Home / Self Care | Source: Ambulatory Visit | Attending: Nephrology | Admitting: Nephrology

## 2023-10-12 DIAGNOSIS — N281 Cyst of kidney, acquired: Secondary | ICD-10-CM | POA: Diagnosis not present

## 2023-10-12 DIAGNOSIS — N1831 Chronic kidney disease, stage 3a: Secondary | ICD-10-CM

## 2023-10-19 ENCOUNTER — Ambulatory Visit: Admitting: Physical Medicine and Rehabilitation

## 2023-10-19 ENCOUNTER — Encounter: Payer: Self-pay | Admitting: Physical Medicine and Rehabilitation

## 2023-10-19 DIAGNOSIS — M47817 Spondylosis without myelopathy or radiculopathy, lumbosacral region: Secondary | ICD-10-CM | POA: Diagnosis not present

## 2023-10-19 DIAGNOSIS — G8929 Other chronic pain: Secondary | ICD-10-CM | POA: Diagnosis not present

## 2023-10-19 DIAGNOSIS — M545 Low back pain, unspecified: Secondary | ICD-10-CM | POA: Diagnosis not present

## 2023-10-19 DIAGNOSIS — M47816 Spondylosis without myelopathy or radiculopathy, lumbar region: Secondary | ICD-10-CM

## 2023-10-19 NOTE — Progress Notes (Signed)
 Theresa Nolan - 68 y.o. female MRN [id number]  Date of birth: February 12, 1955  Office Visit Note: Visit Date: 10/19/2023 PCP: Leonel Cole, MD Referred by: Leonel Cole, MD  Subjective: Chief Complaint  Patient presents with   Lower Back - Pain   HPI: Theresa Nolan is a 68 y.o. female who comes in today as a self referral for evaluation of chronic, worsening and severe right sided lower back pain. Pain ongoing for several years. Her pain worsens with movement and activity. 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 She describes pain as dull and aching sensation, currently rates as 8 out of 10. Some relief of pain with home exercise regimen, rest and use of medications. History of formal physical therapy with minimal relief of pain. Lumbar MRI imaging from 2023 shows moderate facet and ligament flavum hypertrophy, degenerative facet joint fluid at L4-L5 and L5-S1. No high grade spinal canal stenosis noted. She underwent right L4-L5 and L5-S1 facet joint injections in our office on 07/04/2023. She reports greater than 50% relief of pain for over a year with this procedure. Patient denies focal weakness, numbness and tingling. No recent trauma or falls.      Review of Systems  Musculoskeletal:  Positive for back pain and myalgias.  Neurological:  Negative for tingling, sensory change, focal weakness and weakness.  All other systems reviewed and are negative.  Otherwise per HPI.  Assessment & Plan: Visit Diagnoses:    ICD-10-CM   1. Chronic right-sided low back pain without sciatica  M54.50 Ambulatory referral to Physical Medicine Rehab   G89.29     2. Spondylosis without myelopathy or radiculopathy, lumbosacral region  M47.817 Ambulatory referral to Physical Medicine Rehab    3. Facet arthropathy, lumbar  M47.816 Ambulatory referral to Physical Medicine Rehab       Plan: Findings:  Chronic, worsening and severe right sided lower back pain. No radicular symptoms down the  legs. Patient continues to have severe pain despite good conservative therapies such as formal physical therapy, home exercise regimen, rest and use of medications. Patients clinical presentation and exam are consistent with facet mediated pain. There is facet arthropathy at L4-L5 and L5-S1. Next step is to perform diagnostic and hopefully therapeutic right L4-L5 and L5-S1 facet joint injections under fluoroscopic guidance. She has no questions regarding injection. Could look at re-grouping with formal physical therapy. Would also consider updating lumbar MRI imaging at some point. No red flag symptoms noted upon exam today.     Meds & Orders: No orders of the defined types were placed in this encounter.   Orders Placed This Encounter  Procedures   Ambulatory referral to Physical Medicine Rehab    Follow-up: Return for Right L4-L5 and L5-S1 facet joint injections.   Procedures: No procedures performed      Clinical History: MRI LUMBAR SPINE WITHOUT CONTRAST   TECHNIQUE: Multiplanar, multisequence MR imaging of the lumbar spine was performed. No intravenous contrast was administered.   COMPARISON:  Lumbar radiographs 08/07/2020. MRI 10/14/2012.   FINDINGS: Segmentation: Normal on the 2022 radiographs, the same numbering system used on the 2014 MRI.   Alignment: Stable chronic lumbar lordosis. Mild dextroconvex lumbar scoliosis demonstrated on the radiographs last year is less apparent now. However, subtle anterolisthesis has developed at L4-L5 since 2014. See additional details of that level below.   Vertebrae: No marrow edema or evidence of acute osseous abnormality. Normal background bone marrow signal. Intact visible sacrum and SI joints.  Conus medullaris and cauda equina: Conus extends to the L1-L2 level. No lower spinal cord or conus signal abnormality.   Paraspinal and other soft tissues: Negative.   Disc levels:   T10-T11: Mild chronic ligament flavum hypertrophy  without stenosis.   T11-T12: Minor disc bulging and facet hypertrophy. No stenosis.   T12-L1:  Negative.   L1-L2: Chronic left paracentral disc bulge or protrusion here mildly effaces the ventral CSF space (series 9, image 10), but there is no convincing spinal or foraminal stenosis.   L2-L3: Negative disc. Mild to moderate facet hypertrophy has increased since 2014. But there is no significant stenosis.   L3-L4: Mild circumferential disc bulge. Mild to moderate facet and ligament flavum hypertrophy. Tiny posteriorly situated synovial cysts which should not cause neural compromise. No spinal or lateral recess stenosis. Mild left and moderate right L3 foraminal stenosis have not significantly changed.   L4-L5: Subtle anterolisthesis. Increased circumferential disc bulge, and right foraminal component of disc (series 7, image 5). Increased moderate facet and ligament flavum hypertrophy, degenerative facet joint fluid. Small posteriorly situated synovial cysts bilaterally which should not cause neural compromise. No significant spinal or lateral recess stenosis. Mild to moderate left L4 foraminal stenosis is chronic and stable. But moderate right L4 foraminal stenosis is new since 2014.   L5-S1: Increased circumferential disc bulge, especially left foraminal disc and endplate spurring. Moderate to severe facet hypertrophy has progressed and is greater on the left (series 9, image 34) with bilateral degenerative facet joint fluid. Small left-side subchondral synovial cyst but right side synovial cyst projecting toward the right neural foramina (series 9, image 33). No spinal or lateral recess stenosis. Increased and moderate left L4 foraminal stenosis. Mild right L5 foraminal stenosis appears stable aside from the small new synovial cyst.   IMPRESSION: 1. Subtle anterolisthesis has developed at L4-L5 since 2014, with progressed facet disease there and especially at L5-S1.  And increased foraminal disc at both levels. No spinal or lateral recess stenosis at either level, but progressed and moderate neural foraminal stenosis at the right L4 and the bilateral L5 nerve levels.   2. No significant lumbar spinal or lateral recess stenosis, and other lumbar levels have not significantly changed since 2014.     Electronically Signed   By: VEAR Hurst M.D.   On: 03/17/2021 10:07   She reports that she has quit smoking. She has never used smokeless tobacco. No results for input(s): HGBA1C, LABURIC in the last 8760 hours.  Objective:  VS:  HT:    WT:   BMI:     BP:   HR: bpm  TEMP: ( )  RESP:  Physical Exam Vitals and nursing note reviewed.  HENT:     Head: Normocephalic and atraumatic.     Right Ear: External ear normal.     Left Ear: External ear normal.     Nose: Nose normal.     Mouth/Throat:     Mouth: Mucous membranes are moist.  Eyes:     Extraocular Movements: Extraocular movements intact.  Cardiovascular:     Rate and Rhythm: Normal rate.     Pulses: Normal pulses.  Pulmonary:     Effort: Pulmonary effort is normal.  Abdominal:     General: Abdomen is flat. There is no distension.  Musculoskeletal:        General: Tenderness present.     Cervical back: Normal range of motion.     Comments: Patient rises from seated position to standing without  difficulty. Pain noted with facet loading and lumbar extension. 5/5 strength noted with bilateral hip flexion, knee flexion/extension, ankle dorsiflexion/plantarflexion and EHL. No clonus noted bilaterally. No pain upon palpation of greater trochanters. No pain with internal/external rotation of bilateral hips. Sensation intact bilaterally. Negative slump test bilaterally. Ambulates without aid, gait steady.     Skin:    General: Skin is warm and dry.     Capillary Refill: Capillary refill takes less than 2 seconds.  Neurological:     General: No focal deficit present.     Mental Status: She is  alert and oriented to person, place, and time.  Psychiatric:        Mood and Affect: Mood normal.        Behavior: Behavior normal.     Ortho Exam  Imaging: No results found.  Past Medical/Family/Surgical/Social History: Medications & Allergies reviewed per EMR, new medications updated. Patient Active Problem List   Diagnosis Date Noted   Unilateral primary osteoarthritis, left hip 12/28/2017   Status post total replacement of left hip 12/28/2017   Cholecystitis 05/23/2014   Acute cholecystitis    HYPERLIPIDEMIA-MIXED 07/17/2009   HYPERTENSION, BENIGN 07/17/2009   SYNCOPE AND COLLAPSE 07/17/2009   EDEMA 07/17/2009   Past Medical History:  Diagnosis Date   Chronic back pain    Degenerative joint disease    GERD (gastroesophageal reflux disease)    Hypercholesteremia    Hypertension    Hypothyroidism    Lacunar infarction (HCC)    Stroke (HCC)    TSH elevation    History reviewed. No pertinent family history. 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 NL LVF. EF 60%.   CHOLECYSTECTOMY N/A 05/24/2014   Procedure: LAPAROSCOPIC CHOLECYSTECTOMY WITH INTRAOPERATIVE CHOLANGIOGRAM;  Surgeon: Vicenta Poli, MD;  Location: [facility] OR;  Service: General;  Laterality: N/A;   HERNIA REPAIR     umbilical hernia repair as a child   JOINT REPLACEMENT     TONSILLECTOMY     TOTAL HIP ARTHROPLASTY Left 12/28/2017   TOTAL HIP ARTHROPLASTY Left 12/28/2017   Procedure: LEFT TOTAL HIP ARTHROPLASTY ANTERIOR APPROACH;  Surgeon: Poli Lonni GRADE, MD;  Location: [facility] OR;  Service: Orthopedics;  Laterality: Left;   Social History   Occupational History   Not on file  Tobacco Use   Smoking status: Former   Smokeless tobacco: Never   Tobacco comments:    quit over 30 years ago  Vaping Use   Vaping status: Never Used   Substance and Sexual Activity   Alcohol use: Yes    Alcohol/week: 1.0 standard drink of alcohol    Types: 1 Glasses of wine per week    Comment: occasionally    Drug use: No   Sexual activity: Not on file

## 2023-10-19 NOTE — Progress Notes (Signed)
 Pain Scale   Average Pain 8 Patient advising she has chronic lower back pain that is constant.        +Driver, -BT, -Dye Allergies.

## 2023-10-22 ENCOUNTER — Other Ambulatory Visit: Payer: Self-pay

## 2023-10-22 ENCOUNTER — Encounter (HOSPITAL_COMMUNITY): Payer: Self-pay

## 2023-10-22 ENCOUNTER — Emergency Department (HOSPITAL_COMMUNITY): Admission: EM | Admit: 2023-10-22 | Discharge: 2023-10-22 | Disposition: A | Discharge status: Home / Self Care

## 2023-10-22 ENCOUNTER — Emergency Department (HOSPITAL_COMMUNITY)

## 2023-10-22 ENCOUNTER — Emergency Department (HOSPITAL_COMMUNITY)
Admission: EM | Admit: 2023-10-22 | Discharge: 2023-10-22 | Disposition: A | Discharge status: Home / Self Care | Source: Home / Self Care | Attending: Emergency Medicine | Admitting: Emergency Medicine

## 2023-10-22 DIAGNOSIS — Z96642 Presence of left artificial hip joint: Secondary | ICD-10-CM | POA: Insufficient documentation

## 2023-10-22 DIAGNOSIS — M4804 Spinal stenosis, thoracic region: Secondary | ICD-10-CM | POA: Diagnosis not present

## 2023-10-22 DIAGNOSIS — M5010 Cervical disc disorder with radiculopathy, unspecified cervical region: Secondary | ICD-10-CM | POA: Diagnosis not present

## 2023-10-22 DIAGNOSIS — M549 Dorsalgia, unspecified: Secondary | ICD-10-CM | POA: Insufficient documentation

## 2023-10-22 DIAGNOSIS — R911 Solitary pulmonary nodule: Secondary | ICD-10-CM

## 2023-10-22 DIAGNOSIS — Z79899 Other long term (current) drug therapy: Secondary | ICD-10-CM | POA: Diagnosis not present

## 2023-10-22 DIAGNOSIS — N189 Chronic kidney disease, unspecified: Secondary | ICD-10-CM | POA: Insufficient documentation

## 2023-10-22 DIAGNOSIS — R29818 Other symptoms and signs involving the nervous system: Secondary | ICD-10-CM | POA: Diagnosis not present

## 2023-10-22 DIAGNOSIS — I129 Hypertensive chronic kidney disease with stage 1 through stage 4 chronic kidney disease, or unspecified chronic kidney disease: Secondary | ICD-10-CM | POA: Insufficient documentation

## 2023-10-22 DIAGNOSIS — Z7982 Long term (current) use of aspirin: Secondary | ICD-10-CM | POA: Insufficient documentation

## 2023-10-22 DIAGNOSIS — M79601 Pain in right arm: Secondary | ICD-10-CM | POA: Diagnosis not present

## 2023-10-22 DIAGNOSIS — R200 Anesthesia of skin: Secondary | ICD-10-CM | POA: Diagnosis not present

## 2023-10-22 DIAGNOSIS — M5412 Radiculopathy, cervical region: Secondary | ICD-10-CM | POA: Diagnosis not present

## 2023-10-22 DIAGNOSIS — M47814 Spondylosis without myelopathy or radiculopathy, thoracic region: Secondary | ICD-10-CM | POA: Diagnosis not present

## 2023-10-22 DIAGNOSIS — M4722 Other spondylosis with radiculopathy, cervical region: Secondary | ICD-10-CM | POA: Diagnosis not present

## 2023-10-22 DIAGNOSIS — M542 Cervicalgia: Secondary | ICD-10-CM | POA: Diagnosis present

## 2023-10-22 DIAGNOSIS — M545 Low back pain, unspecified: Secondary | ICD-10-CM | POA: Diagnosis not present

## 2023-10-22 LAB — BASIC METABOLIC PANEL WITH GFR
Anion gap: 13 (ref 5–15)
BUN: 27 mg/dL — ABNORMAL HIGH (ref 8–23)
CO2: 22 mmol/L (ref 22–32)
Calcium: 9.3 mg/dL (ref 8.9–10.3)
Chloride: 101 mmol/L (ref 98–111)
Creatinine, Ser: 1.57 mg/dL — ABNORMAL HIGH (ref 0.44–1.00)
GFR, Estimated: 36 mL/min — ABNORMAL LOW (ref >60)
Glucose, Bld: 98 mg/dL (ref 70–99)
Potassium: 4.3 mmol/L (ref 3.5–5.1)
Sodium: 136 mmol/L (ref 135–145)

## 2023-10-22 LAB — CBC
HCT: 39 % (ref 36.0–46.0)
Hemoglobin: 12.7 g/dL (ref 12.0–15.0)
MCH: 30.2 pg (ref 26.0–34.0)
MCHC: 32.6 g/dL (ref 30.0–36.0)
MCV: 92.6 fL (ref 80.0–100.0)
Platelets: 289 K/uL (ref 150–400)
RBC: 4.21 MIL/uL (ref 3.87–5.11)
RDW: 12.9 % (ref 11.5–15.5)
WBC: 10.3 K/uL (ref 4.0–10.5)
nRBC: 0 % (ref 0.0–0.2)

## 2023-10-22 LAB — TROPONIN I (HIGH SENSITIVITY)
Troponin I (High Sensitivity): 4 ng/L (ref <18)
Troponin I (High Sensitivity): 3 ng/L (ref <18)

## 2023-10-22 MED ORDER — SODIUM CHLORIDE 0.9 % IV BOLUS
1000.0000 mL | Freq: Once | INTRAVENOUS | Status: AC
  Administered 2023-10-22: 1000 mL via INTRAVENOUS

## 2023-10-22 MED ORDER — GABAPENTIN 300 MG PO CAPS
300.0000 mg | ORAL_CAPSULE | Freq: Two times a day (BID) | ORAL | 0 refills | Status: DC
  Filled 2023-10-22: qty 60, 30d supply, fill #0

## 2023-10-22 MED ORDER — ONDANSETRON HCL 4 MG/2ML IJ SOLN
4.0000 mg | Freq: Once | INTRAMUSCULAR | Status: AC
  Administered 2023-10-22: 4 mg via INTRAVENOUS
  Filled 2023-10-22: qty 2

## 2023-10-22 MED ORDER — IOHEXOL 350 MG/ML SOLN
100.0000 mL | Freq: Once | INTRAVENOUS | Status: AC | PRN
  Administered 2023-10-22: 100 mL via INTRAVENOUS

## 2023-10-22 MED ORDER — MORPHINE SULFATE (PF) 4 MG/ML IV SOLN
4.0000 mg | Freq: Once | INTRAVENOUS | Status: AC
  Administered 2023-10-22: 4 mg via INTRAVENOUS
  Filled 2023-10-22: qty 1

## 2023-10-22 MED ORDER — GABAPENTIN 300 MG PO CAPS: 300.0000 mg | ORAL_CAPSULE | Freq: Two times a day (BID) | ORAL | 0 refills | Status: DC

## 2023-10-22 NOTE — ED Triage Notes (Addendum)
 Patient c/o neck pain and right arm pain x 1 month, seen this morning for same, states no changes since this morning that she is aware of. Patient's family is concerned for bilateral wrist swelling, hx of neck injury, and arm pain.

## 2023-10-22 NOTE — ED Triage Notes (Signed)
 PT to triage with c/o right arm pain and numbness, states pain now feels like its on fire.

## 2023-10-22 NOTE — ED Notes (Signed)
 Patient verbalizes understanding of discharge instructions. Opportunity for questioning and answers were provided. Armband removed by staff, pt discharged from ED. Wheeled out to car with daughter

## 2023-10-22 NOTE — Discharge Instructions (Addendum)
 Evaluate today was overall reassuring.  As we discussed would recommend conservative treatment for what is likely muscle injury in your right upper back.  Please follow-up with your PCP.  Also your kidney function is reduced but you were treated with IV fluids and also recommend that you have a discussion with your PCP and recheck of those labs in the future.  Suspect it is likely chronic.

## 2023-10-22 NOTE — ED Triage Notes (Signed)
 Pt complaining of pain in her right arm right neck and right shoulder. Pt also reporting tingling in that arn. Pt reports it comes in spasms. Pt reports that she was here this morning that the pain was worse this morning when she was seen. Pt reports that she has cold hands.

## 2023-10-22 NOTE — Discharge Instructions (Addendum)
 Again, use the prescribed medications as well as follow-up with primary care for continued imaging and any other evaluations regarding your chronic pain.

## 2023-10-22 NOTE — ED Provider Notes (Signed)
 [facility] Provider Note   CSN: [id number] Arrival date & time: 10/22/23  0800     Patient presents with: Arm Pain  HPI Theresa Nolan is a 68 y.o. female with history of hypertension, CKD, hypercholesterolemia, lacunar infarct presenting for right arm pain.  She states has been going on for a month but much worse in the last couple days.  Denies trauma.  She states the pain originates in her upper middle back and radiates down the right arm.  She states that the pain feels like her arm is on fire.  She states that the pain does radiate into her chest.  Denies shortness of breath at this time.  Has been taking her blood pressure medicines.    Arm Pain       Prior to Admission medications   Medication Sig Start Date End Date Taking? Authorizing Provider  amLODipine  (NORVASC ) 10 MG tablet Take 1 tablet (10 mg total) by mouth daily. 10/01/23     amLODipine -valsartan  (EXFORGE ) 10-160 MG per tablet Take 1 tablet by mouth every evening.     [provider]  aspirin  81 MG chewable tablet Chew 1 tablet (81 mg total) by mouth 2 (two) times daily. 12/30/17   Gretta Bertrum ORN, PA-C  carvedilol  (COREG ) 12.5 MG tablet Take 12.5 mg by mouth 2 (two) times daily with a meal.    [provider]  carvedilol  (COREG ) 12.5 MG tablet Take 1 tablet (12.5 mg total) by mouth 2 (two) times daily with food 09/19/20     carvedilol  (COREG ) 12.5 MG tablet Take 1 tablet (12.5 mg total) by mouth 2 (two) times daily with food. 09/19/20     carvedilol  (COREG ) 12.5 MG tablet TAKE 1 TABLET BY MOUTH TWO TIMES DAILY WITH FOOD 07/20/19 09/30/23  Signa Dire, MD  carvedilol  (COREG ) 12.5 MG tablet Take 1 tablet (12.5 mg total) by mouth 2 (two) times daily with food 12/03/21     carvedilol  (COREG ) 12.5 MG tablet Take 1 tablet (12.5 mg total) by mouth 2 (two) times daily with food. 10/01/23     cholecalciferol  (VITAMIN D ) 1000 UNITS tablet Take 2,000 Units by mouth  daily.     [provider]  co-enzyme Q-10 30 MG capsule Take 30 mg by mouth every evening.    [provider]  ezetimibe  (ZETIA ) 10 MG tablet Take 10 mg by mouth daily.    [provider]  ezetimibe  (ZETIA ) 10 MG tablet TAKE 1 TABLET BY MOUTH ONCE A DAY 08/23/19 09/30/23  Signa Dire, MD  ezetimibe  (ZETIA ) 10 MG tablet Take 1 tablet (10 mg total) by mouth daily. 05/04/23     ezetimibe  (ZETIA ) 10 MG tablet Take 1 tablet (10 mg total) by mouth daily. 10/01/23     hydrochlorothiazide  (HYDRODIURIL ) 25 MG tablet Take 25 mg by mouth every evening.     [provider]  hydrochlorothiazide  (HYDRODIURIL ) 25 MG tablet Take 1 tablet (25 mg total) by mouth in the morning. 08/01/20     hydrochlorothiazide  (HYDRODIURIL ) 25 MG tablet TAKE 1 TABLET BY MOUTH ONCE DAILY. 08/28/19 09/30/23  Signa Dire, MD  hydrochlorothiazide  (HYDRODIURIL ) 25 MG tablet Take 1 tablet (25 mg total) by mouth in the morning. 06/09/23     ketoconazole  (NIZORAL ) 200 MG tablet Take 1 tablet (200 mg total) by mouth daily for 10 days 04/22/23     levothyroxine  (SYNTHROID ) 25 MCG tablet TAKE 1 TABLET BY MOUTH ONCE DAILY 01/29/20 09/30/23  Signa,  Richardson, MD  levothyroxine  (SYNTHROID ) 25 MCG tablet Take 1 tablet (25 mcg total) by mouth in the morning on Nolan empty stomach. 06/23/23     levothyroxine  (SYNTHROID ) 50 MCG tablet Take 1 tablet (50 mcg total) by mouth in the morning on Nolan empty stomach. 09/09/23     levothyroxine  (SYNTHROID , LEVOTHROID) 25 MCG tablet Take 25 mcg by mouth daily.    [provider]  losartan  (COZAAR ) 100 MG tablet TAKE 1 TABLET BY MOUTH ONCE DAILY. 04/17/20 09/30/23  Signa Richardson, MD  losartan  (COZAAR ) 100 MG tablet Take 1 tablet (100 mg total) by mouth daily. 08/24/22     losartan  (COZAAR ) 100 MG tablet Take 1 tablet (100 mg total) by mouth daily. 11/19/22     meloxicam  (MOBIC ) 7.5 MG tablet Take 1 tablet (7.5 mg total) by mouth daily. 08/12/23     Menthol -Methyl Salicylate (MUSCLE  RUB) 10-15 % CREA Apply 1 application topically as needed for muscle pain.    [provider]  methocarbamol  (ROBAXIN ) 750 MG tablet Take 1 tablet (750 mg total) by mouth every 8 (eight) hours as needed for muscle spasms. 10/01/23   Gretta Bertrum ORN, PA-C  naproxen sodium (ANAPROX) 220 MG tablet Take 220-440 mg by mouth 2 (two) times daily as needed (pain).    [provider]  Omega-3 Fatty Acids (FISH OIL) 1000 MG CAPS Take 2 capsules by mouth daily.     [provider]  omeprazole  (PRILOSEC OTC) 20 MG tablet Take 20 mg by mouth 2 (two) times daily.    [provider]  omeprazole  (PRILOSEC) 20 MG capsule TAKE 1 CAPSULE BY MOUTH TWICE DAILY. 06/26/19 09/30/23  Signa Richardson, MD  omeprazole  (PRILOSEC) 20 MG capsule Take 1 capsule (20 mg total) by mouth 2 (two) times daily. 02/23/23     simvastatin  (ZOCOR ) 40 MG tablet Take 40 mg by mouth daily.    [provider]  simvastatin  (ZOCOR ) 40 MG tablet Take 1 tablet (40 mg total) by mouth every evening. 09/19/20     simvastatin  (ZOCOR ) 40 MG tablet Take 1 tablet (40 mg total) by mouth every evening. 09/19/20     simvastatin  (ZOCOR ) 40 MG tablet Take 1 tablet (40 mg total) by mouth every evening. 06/09/23     traMADol  (ULTRAM ) 50 MG tablet Take 1 tablet (50 mg total) by mouth 3 (three) times daily as needed. 07/02/22     traMADol  (ULTRAM ) 50 MG tablet Take 1 tablet (50 mg total) by mouth 3 (three) times daily as needed. 08/24/22     traMADol  (ULTRAM ) 50 MG tablet Take 1 tablet (50 mg total) by mouth 3 (three) times daily as needed. 09/29/22     traMADol  (ULTRAM ) 50 MG tablet Take 1 tablet (50 mg total) by mouth 3 (three) times daily as needed. 10/01/23     TURMERIC PO Take 2 tablets by mouth every evening.    [provider]    Allergies: Lisinopril    Review of Systems See HPI  Updated Vital Signs BP (!) 149/78   Pulse 68   Temp 97.9 F (36.6 C) (Oral)   Resp 16   Ht 5' 8.4 (1.737 m)   Wt 104.3 kg    SpO2 100%   BMI 34.56 kg/m   Physical Exam Vitals and nursing note reviewed.  HENT:     Head: Normocephalic and atraumatic.     Mouth/Throat:     Mouth: Mucous membranes are moist.  Eyes:     General:  Right eye: No discharge.        Left eye: No discharge.     Conjunctiva/sclera: Conjunctivae normal.  Cardiovascular:     Rate and Rhythm: Normal rate and regular rhythm.     Pulses: Normal pulses.     Heart sounds: Normal heart sounds.  Pulmonary:     Effort: Pulmonary effort is normal.     Breath sounds: Normal breath sounds.  Abdominal:     General: Abdomen is flat.     Palpations: Abdomen is soft.  Musculoskeletal:       Back:  Skin:    General: Skin is warm and dry.  Neurological:     General: No focal deficit present.     Comments: GCS 15. Speech is goal oriented. No deficits appreciated to CN III-XII; symmetric eyebrow raise, no facial drooping, tongue midline. Patient has equal grip strength bilaterally with 5/5 strength against resistance in all major muscle groups bilaterally. Sensation to light touch intact. Patient moves extremities without ataxia. Normal finger-nose-finger. Patient ambulatory with steady gait.  Psychiatric:        Mood and Affect: Mood normal.     (all labs ordered are listed, but only abnormal results are displayed) Labs Reviewed  BASIC METABOLIC PANEL WITH GFR - Abnormal; Notable for the following components:      Result Value   BUN 27 (*)    Creatinine, Ser 1.57 (*)    GFR, Estimated 36 (*)    All other components within normal limits  CBC  TROPONIN I (HIGH SENSITIVITY)  TROPONIN I (HIGH SENSITIVITY)    EKG: EKG Interpretation Date/Time:  Friday October 22 2023 08:13:06 EDT Ventricular Rate:  87 PR Interval:  154 QRS Duration:  100 QT Interval:  371 QTC Calculation: 447 R Axis:   53  Text Interpretation: Sinus rhythm Probable anteroseptal infarct, recent Confirmed by Ula Barter [phone]) on 10/22/2023 8:17:00  AM  Radiology: CT Angio Chest/Abd/Pel for Dissection W and/or W/WO Result Date: 10/22/2023 CLINICAL DATA:  Right arm pain and numbness. EXAM: CT ANGIOGRAPHY CHEST, ABDOMEN AND PELVIS TECHNIQUE: Non-contrast CT of the chest was initially obtained. Multidetector CT imaging through the chest, abdomen and pelvis was performed using the standard protocol during bolus administration of intravenous contrast. Multiplanar reconstructed images and MIPs were obtained and reviewed to evaluate the vascular anatomy. RADIATION DOSE REDUCTION: This exam was performed according to the departmental dose-optimization program which includes automated exposure control, adjustment of the mA and/or kV according to patient size and/or use of iterative reconstruction technique. CONTRAST:  OMNIPAQUE  IOHEXOL  350 MG/ML SOLN COMPARISON:  None Available. FINDINGS: CTA CHEST FINDINGS Cardiovascular: Preferential opacification of the thoracic aorta. No evidence of thoracic aortic aneurysm or dissection. Normal heart size. No pericardial effusion. Mild coronary artery calcifications are noted. Mediastinum/Nodes: No enlarged mediastinal, hilar, or axillary lymph nodes. Thyroid  gland, trachea, and esophagus demonstrate no significant findings. Lungs/Pleura: No pneumothorax or pleural effusion is noted. 4 mm nodule is noted in left upper lobe best seen on image number 56 of series 6. No consolidative process is noted. Musculoskeletal: No chest wall abnormality. No acute or significant osseous findings. Review of the MIP images confirms the above findings. CTA ABDOMEN AND PELVIS FINDINGS VASCULAR Aorta: Atherosclerosis of abdominal aorta is noted without aneurysm or dissection. Celiac: Patent without evidence of aneurysm, dissection, vasculitis or significant stenosis. SMA: Patent without evidence of aneurysm, dissection, vasculitis or significant stenosis. Renals: Both renal arteries are patent without evidence of aneurysm, dissection,  vasculitis, fibromuscular dysplasia  or significant stenosis. IMA: Patent without evidence of aneurysm, dissection, vasculitis or significant stenosis. Inflow: Patent without evidence of aneurysm, dissection, vasculitis or significant stenosis. Veins: No obvious venous abnormality within the limitations of this arterial phase study. Review of the MIP images confirms the above findings. NON-VASCULAR Hepatobiliary: No focal liver abnormality is seen. Status post cholecystectomy. No biliary dilatation. Pancreas: Unremarkable. No pancreatic ductal dilatation or surrounding inflammatory changes. Spleen: Normal in size without focal abnormality. Adrenals/Urinary Tract: Adrenal glands appear normal. Bilateral renal cortical scarring is noted. Left renal cyst is noted. No hydronephrosis or renal obstruction is noted. Urinary bladder is unremarkable. Stomach/Bowel: Stomach is unremarkable. Status post appendectomy. No evidence of bowel obstruction or inflammation. Lymphatic: No adenopathy is noted. Reproductive: Status post hysterectomy. No adnexal masses. Other: No abdominal wall hernia or abnormality. No abdominopelvic ascites. Musculoskeletal: Status post left total hip arthroplasty. No acute osseous abnormality is noted. Review of the MIP images confirms the above findings. IMPRESSION: 1. No evidence of thoracic or abdominal aortic dissection or aneurysm. 2. 4 mm nodule is noted in left upper lobe. If patient is low risk for malignancy, no routine follow-up imaging is recommended. If patient is high risk for malignancy, a non-contrast chest CT at 12 months is optional.This recommendation follows the consensus statement: Guidelines for Management of Incidental Pulmonary Nodules Detected on CT Images: From the Fleischner Society 2017; Radiology 2017; 284:228-243. 3. Mild coronary artery calcifications are noted. 4. Bilateral renal cortical scarring is noted. 5. Aortic atherosclerosis. Aortic Atherosclerosis (ICD10-I70.0).  Electronically Signed   By: Lynwood Landy Raddle M.D.   On: 10/22/2023 10:48   CT T-SPINE NO CHARGE Result Date: 10/22/2023 EXAM: CT THORACIC SPINE WITHOUT CONTRAST 10/22/2023 10:10:00 AM TECHNIQUE: CT of the thoracic spine was performed without the administration of intravenous contrast. Multiplanar reformatted images are provided for review. Automated exposure control, iterative reconstruction, and/or weight based adjustment of the mA/kV was utilized to reduce the radiation dose to as low as reasonably achievable. COMPARISON: None available. CLINICAL HISTORY: PT to triage with c/o right arm pain and numbness, states pain now feels like its on fire. FINDINGS: BONES AND ALIGNMENT: Normal vertebral body heights. No acute fracture or suspicious bone lesion. Normal alignment. DEGENERATIVE CHANGES: There is mild disc space narrowing and endplate bridging. There is no significant spinal canal or neural foraminal stenosis evident. SOFT TISSUES: No acute abnormality. LUNGS: There are ground-glass and reticular opacities present dependently within the lower lobes bilaterally. IMPRESSION: 1. No acute abnormality of the thoracic spine. 2. Mild multilevel thoracic spondylosis without significant spinal canal or neural foraminal stenosis. Electronically signed by: Evalene Coho MD 10/22/2023 10:20 AM EDT RP Workstation: [id number]   CT Head Wo Contrast Result Date: 10/22/2023 EXAM: CT HEAD WITHOUT CONTRAST 10/22/2023 10:10:00 AM TECHNIQUE: CT of the head was performed without the administration of intravenous contrast. Automated exposure control, iterative reconstruction, and/or weight based adjustment of the mA/kV was utilized to reduce the radiation dose to as low as reasonably achievable. COMPARISON: CT of the head dated 06/14/2009. CLINICAL HISTORY: Neuro deficit, acute, stroke suspected. PT to triage with c/o right arm pain and numbness, states pain now feels like its on fire. FINDINGS: BRAIN AND VENTRICLES: No acute  hemorrhage. No evidence of acute infarct. No hydrocephalus. No extra-axial collection. No mass effect or midline shift. ORBITS: The patient is status post bilateral lens replacement. No acute abnormality. SINUSES: No acute abnormality. SOFT TISSUES AND SKULL: No acute soft tissue abnormality. No skull fracture. IMPRESSION: 1. No acute intracranial abnormality.  Electronically signed by: Evalene Coho MD 10/22/2023 10:18 AM EDT RP Workstation: [id number]   CT Cervical Spine Wo Contrast Result Date: 10/22/2023 EXAM: CT CERVICAL SPINE WITHOUT CONTRAST 10/22/2023 10:10:00 AM TECHNIQUE: CT of the cervical spine was performed without the administration of intravenous contrast. Multiplanar reformatted images are provided for review. Automated exposure control, iterative reconstruction, and/or weight based adjustment of the mA/kV was utilized to reduce the radiation dose to as low as reasonably achievable. COMPARISON: MRI of the cervical spine dated 12/20/2021. CLINICAL HISTORY: Cervical radiculopathy, no red flags. PT to triage with c/o right arm pain and numbness, states pain now feels like its on fire. FINDINGS: CERVICAL SPINE: BONES AND ALIGNMENT: Mild reversal of the normal cervical lordosis. No acute fracture or traumatic malalignment. DEGENERATIVE CHANGES: Multilevel degenerative disc disease and facet arthrosis. SOFT TISSUES: No prevertebral soft tissue swelling. IMPRESSION: 1. Mild reversal of the normal cervical lordosis. 2. Multilevel degenerative disc disease and facet arthrosis. Electronically signed by: Evalene Coho MD 10/22/2023 10:17 AM EDT RP Workstation: [id number]   DG Chest Port 1 View Result Date: 10/22/2023 CLINICAL DATA:  Right arm pain with numbness. EXAM: PORTABLE CHEST 1 VIEW COMPARISON:  05/23/2014 FINDINGS: Lungs are hypoinflated and otherwise clear. Cardiomediastinal silhouette and remainder the exam is unchanged. IMPRESSION: Hypoinflation without acute cardiopulmonary disease.  Electronically Signed   By: Toribio Agreste M.D.   On: 10/22/2023 08:50     Procedures   Medications Ordered in the ED  ondansetron  (ZOFRAN ) injection 4 mg (4 mg Intravenous Given 10/22/23 0834)  morphine  (PF) 4 MG/ML injection 4 mg (4 mg Intravenous Given 10/22/23 0835)  sodium chloride  0.9 % bolus 1,000 mL (1,000 mLs Intravenous New Bag/Given 10/22/23 1016)  iohexol  (OMNIPAQUE ) 350 MG/ML injection 100 mL (100 mLs Intravenous Contrast Given 10/22/23 0936)                                    Medical Decision Making Amount and/or Complexity of Data Reviewed Labs: ordered. Radiology: ordered.  Risk Prescription drug management.   68 year old well-appearing female presenting for back pain and right arm pain with tingling and numbness.  Exam was unremarkable aside from for tenderness in the right upper back but she was notably in distress secondary to her pain and blood pressure at that time was 194/93.  Given the character of her pain I was concern for potential aortic dissection and also considering PE and/or ACS and stroke.  Fortunately all her CT scans and x-rays were reassuring.  On reassessment she stated that her symptoms had resolved.  I did a follow-up neuroexam as well and there was no appreciable weakness on exam initially and on secondary assessment.  Feel this is likely soft tissue injury.  Advised conservative treatment with Tylenol .  Also advised her to follow-up to PCP.  Discussed return precautions.  Discharged.  All the labs did reveal reduced renal function but patient has documented history of CKD did not have labs here to compare in the last 5 years at least.  Did treat her with a liter of fluids and advised her to follow-up with her PCP for recheck of her labs.     Final diagnoses:  Back pain, unspecified back location, unspecified back pain laterality, unspecified chronicity    ED Discharge Orders     None          Lang Norleen POUR, PA-C 10/22/23 1119     Ula Barter  R, MD 10/22/23 1539

## 2023-10-22 NOTE — ED Provider Notes (Signed)
 [place] EMERGENCY DEPARTMENT AT [facility] Provider Note   CSN: [id number] Arrival date & time: 10/22/23  1639     Patient presents with: Neck Pain, Arm Pain, and Shoulder Injury   Theresa Nolan is a 68 y.o. female who is discharged from this ED earlier today for similar type symptoms.  She reports she has a burning sensation down the right arm, right hand, and also has some swelling in the right forearm.  Had an extensive workup prior to representing to the ED today.  She did not have any appreciable neurologic deficits during her visit earlier today, no lab abnormalities, had CTA to rule out dissection, did not show any evidence of thoracic or abdominal dissection or aneurysm.  Incidental finding of 4 mm nodule in the left upper lobe of the lungs.  CT of the thoracic spine did not show any acute changes, CT of the head was also equally unremarkable as was the cervical spine.  The chest imaging did not show any acute changes as well.  She was given morphine  during her visit here and stated she had an initial resolution however she presented back to the ED this afternoon because she had an increase in her pain.  Also concerned she had swelling of the right forearm and wrist.  At previous visit was discharged with conservative management, acetaminophen  being primarily the medication abuse, she also received IV fluids for increased creatinine, does have history of CKD.    Neck Pain Arm Pain  Shoulder Injury       Prior to Admission medications   Medication Sig Start Date End Date Taking? Authorizing Provider  gabapentin (NEURONTIN) 300 MG capsule Take 1 capsule (300 mg total) by mouth 2 (two) times daily. 10/22/23  Yes Myriam Dorn BROCKS, PA  amLODipine  (NORVASC ) 10 MG tablet Take 1 tablet (10 mg total) by mouth daily. 10/01/23     amLODipine -valsartan  (EXFORGE ) 10-160 MG per tablet Take 1 tablet by mouth every evening.     [provider]  aspirin  81 MG chewable  tablet Chew 1 tablet (81 mg total) by mouth 2 (two) times daily. 12/30/17   Gretta Bertrum ORN, PA-C  carvedilol  (COREG ) 12.5 MG tablet Take 12.5 mg by mouth 2 (two) times daily with a meal.    [provider]  carvedilol  (COREG ) 12.5 MG tablet Take 1 tablet (12.5 mg total) by mouth 2 (two) times daily with food 09/19/20     carvedilol  (COREG ) 12.5 MG tablet Take 1 tablet (12.5 mg total) by mouth 2 (two) times daily with food. 09/19/20     carvedilol  (COREG ) 12.5 MG tablet TAKE 1 TABLET BY MOUTH TWO TIMES DAILY WITH FOOD 07/20/19 09/30/23  Signa Dire, MD  carvedilol  (COREG ) 12.5 MG tablet Take 1 tablet (12.5 mg total) by mouth 2 (two) times daily with food 12/03/21     carvedilol  (COREG ) 12.5 MG tablet Take 1 tablet (12.5 mg total) by mouth 2 (two) times daily with food. 10/01/23     cholecalciferol  (VITAMIN D ) 1000 UNITS tablet Take 2,000 Units by mouth daily.     [provider]  co-enzyme Q-10 30 MG capsule Take 30 mg by mouth every evening.    [provider]  ezetimibe  (ZETIA ) 10 MG tablet Take 10 mg by mouth daily.    [provider]  ezetimibe  (ZETIA ) 10 MG tablet TAKE 1 TABLET BY MOUTH ONCE A DAY 08/23/19 09/30/23  Signa Dire, MD  ezetimibe  (ZETIA ) 10 MG tablet Take  1 tablet (10 mg total) by mouth daily. 05/04/23     ezetimibe  (ZETIA ) 10 MG tablet Take 1 tablet (10 mg total) by mouth daily. 10/01/23     hydrochlorothiazide  (HYDRODIURIL ) 25 MG tablet Take 25 mg by mouth every evening.     [provider]  hydrochlorothiazide  (HYDRODIURIL ) 25 MG tablet Take 1 tablet (25 mg total) by mouth in the morning. 08/01/20     hydrochlorothiazide  (HYDRODIURIL ) 25 MG tablet TAKE 1 TABLET BY MOUTH ONCE DAILY. 08/28/19 09/30/23  Signa Dire, MD  hydrochlorothiazide  (HYDRODIURIL ) 25 MG tablet Take 1 tablet (25 mg total) by mouth in the morning. 06/09/23     ketoconazole  (NIZORAL ) 200 MG tablet Take 1 tablet (200 mg total) by mouth daily for 10 days 04/22/23      levothyroxine  (SYNTHROID ) 25 MCG tablet TAKE 1 TABLET BY MOUTH ONCE DAILY 01/29/20 09/30/23  Signa Dire, MD  levothyroxine  (SYNTHROID ) 25 MCG tablet Take 1 tablet (25 mcg total) by mouth in the morning on an empty stomach. 06/23/23     levothyroxine  (SYNTHROID ) 50 MCG tablet Take 1 tablet (50 mcg total) by mouth in the morning on an empty stomach. 09/09/23     levothyroxine  (SYNTHROID , LEVOTHROID) 25 MCG tablet Take 25 mcg by mouth daily.    [provider]  losartan  (COZAAR ) 100 MG tablet TAKE 1 TABLET BY MOUTH ONCE DAILY. 04/17/20 09/30/23  Signa Dire, MD  losartan  (COZAAR ) 100 MG tablet Take 1 tablet (100 mg total) by mouth daily. 08/24/22     losartan  (COZAAR ) 100 MG tablet Take 1 tablet (100 mg total) by mouth daily. 11/19/22     meloxicam  (MOBIC ) 7.5 MG tablet Take 1 tablet (7.5 mg total) by mouth daily. 08/12/23     Menthol -Methyl Salicylate (MUSCLE RUB) 10-15 % CREA Apply 1 application topically as needed for muscle pain.    [provider]  methocarbamol  (ROBAXIN ) 750 MG tablet Take 1 tablet (750 mg total) by mouth every 8 (eight) hours as needed for muscle spasms. 10/01/23   Gretta Bertrum ORN, PA-C  naproxen sodium (ANAPROX) 220 MG tablet Take 220-440 mg by mouth 2 (two) times daily as needed (pain).    [provider]  Omega-3 Fatty Acids (FISH OIL) 1000 MG CAPS Take 2 capsules by mouth daily.     [provider]  omeprazole  (PRILOSEC OTC) 20 MG tablet Take 20 mg by mouth 2 (two) times daily.    [provider]  omeprazole  (PRILOSEC) 20 MG capsule TAKE 1 CAPSULE BY MOUTH TWICE DAILY. 06/26/19 09/30/23  Signa Dire, MD  omeprazole  (PRILOSEC) 20 MG capsule Take 1 capsule (20 mg total) by mouth 2 (two) times daily. 02/23/23     simvastatin  (ZOCOR ) 40 MG tablet Take 40 mg by mouth daily.    [provider]  simvastatin  (ZOCOR ) 40 MG tablet Take 1 tablet (40 mg total) by mouth every evening. 09/19/20     simvastatin  (ZOCOR ) 40 MG tablet Take 1  tablet (40 mg total) by mouth every evening. 09/19/20     simvastatin  (ZOCOR ) 40 MG tablet Take 1 tablet (40 mg total) by mouth every evening. 06/09/23     traMADol  (ULTRAM ) 50 MG tablet Take 1 tablet (50 mg total) by mouth 3 (three) times daily as needed. 07/02/22     traMADol  (ULTRAM ) 50 MG tablet Take 1 tablet (50 mg total) by mouth 3 (three) times daily as needed. 08/24/22     traMADol  (ULTRAM ) 50 MG tablet Take 1 tablet (50 mg  total) by mouth 3 (three) times daily as needed. 09/29/22     traMADol  (ULTRAM ) 50 MG tablet Take 1 tablet (50 mg total) by mouth 3 (three) times daily as needed. 10/01/23     TURMERIC PO Take 2 tablets by mouth every evening.    [provider]    Allergies: Lisinopril    Review of Systems  Musculoskeletal:  Positive for neck pain.  All other systems reviewed and are negative.   Updated Vital Signs BP (!) 152/72   Pulse 68   Temp 98.3 F (36.8 C) (Oral)   Resp 17   Ht 5' 7 (1.702 m)   Wt 104.3 kg   SpO2 100%   BMI 36.02 kg/m   Physical Exam Vitals and nursing note reviewed.  Constitutional:      General: She is not in acute distress.    Appearance: Normal appearance.  HENT:     Head: Normocephalic and atraumatic.     Mouth/Throat:     Mouth: Mucous membranes are moist.     Pharynx: Oropharynx is clear.  Eyes:     Extraocular Movements: Extraocular movements intact.     Conjunctiva/sclera: Conjunctivae normal.     Pupils: Pupils are equal, round, and reactive to light.  Cardiovascular:     Rate and Rhythm: Normal rate and regular rhythm.     Pulses: Normal pulses.          Radial pulses are 2+ on the right side and 2+ on the left side.     Heart sounds: Normal heart sounds. No murmur heard.    No friction rub. No gallop.  Pulmonary:     Effort: Pulmonary effort is normal.     Breath sounds: Normal breath sounds.  Abdominal:     General: Abdomen is flat. Bowel sounds are normal.     Palpations: Abdomen is soft.  Musculoskeletal:         General: Normal range of motion.     Cervical back: Normal range of motion and neck supple.     Right lower leg: No edema.     Left lower leg: No edema.  Skin:    General: Skin is warm and dry.     Capillary Refill: Capillary refill takes less than 2 seconds.  Neurological:     General: No focal deficit present.     Mental Status: She is alert and oriented to person, place, and time. Mental status is at baseline.     GCS: GCS eye subscore is 4. GCS verbal subscore is 5. GCS motor subscore is 6.     Cranial Nerves: Cranial nerves 2-12 are intact.     Sensory: Sensation is intact.     Motor: Motor function is intact.     Coordination: Coordination is intact.  Psychiatric:        Mood and Affect: Mood normal.     (all labs ordered are listed, but only abnormal results are displayed) Labs Reviewed - No data to display  EKG: None  Radiology: CT Angio Chest/Abd/Pel for Dissection W and/or W/WO Result Date: 10/22/2023 CLINICAL DATA:  Right arm pain and numbness. EXAM: CT ANGIOGRAPHY CHEST, ABDOMEN AND PELVIS TECHNIQUE: Non-contrast CT of the chest was initially obtained. Multidetector CT imaging through the chest, abdomen and pelvis was performed using the standard protocol during bolus administration of intravenous contrast. Multiplanar reconstructed images and MIPs were obtained and reviewed to evaluate the vascular anatomy. RADIATION DOSE REDUCTION: This exam was performed according to  the departmental dose-optimization program which includes automated exposure control, adjustment of the mA and/or kV according to patient size and/or use of iterative reconstruction technique. CONTRAST:  OMNIPAQUE  IOHEXOL  350 MG/ML SOLN COMPARISON:  None Available. FINDINGS: CTA CHEST FINDINGS Cardiovascular: Preferential opacification of the thoracic aorta. No evidence of thoracic aortic aneurysm or dissection. Normal heart size. No pericardial effusion. Mild coronary artery calcifications are noted.  Mediastinum/Nodes: No enlarged mediastinal, hilar, or axillary lymph nodes. Thyroid  gland, trachea, and esophagus demonstrate no significant findings. Lungs/Pleura: No pneumothorax or pleural effusion is noted. 4 mm nodule is noted in left upper lobe best seen on image number 56 of series 6. No consolidative process is noted. Musculoskeletal: No chest wall abnormality. No acute or significant osseous findings. Review of the MIP images confirms the above findings. CTA ABDOMEN AND PELVIS FINDINGS VASCULAR Aorta: Atherosclerosis of abdominal aorta is noted without aneurysm or dissection. Celiac: Patent without evidence of aneurysm, dissection, vasculitis or significant stenosis. SMA: Patent without evidence of aneurysm, dissection, vasculitis or significant stenosis. Renals: Both renal arteries are patent without evidence of aneurysm, dissection, vasculitis, fibromuscular dysplasia or significant stenosis. IMA: Patent without evidence of aneurysm, dissection, vasculitis or significant stenosis. Inflow: Patent without evidence of aneurysm, dissection, vasculitis or significant stenosis. Veins: No obvious venous abnormality within the limitations of this arterial phase study. Review of the MIP images confirms the above findings. NON-VASCULAR Hepatobiliary: No focal liver abnormality is seen. Status post cholecystectomy. No biliary dilatation. Pancreas: Unremarkable. No pancreatic ductal dilatation or surrounding inflammatory changes. Spleen: Normal in size without focal abnormality. Adrenals/Urinary Tract: Adrenal glands appear normal. Bilateral renal cortical scarring is noted. Left renal cyst is noted. No hydronephrosis or renal obstruction is noted. Urinary bladder is unremarkable. Stomach/Bowel: Stomach is unremarkable. Status post appendectomy. No evidence of bowel obstruction or inflammation. Lymphatic: No adenopathy is noted. Reproductive: Status post hysterectomy. No adnexal masses. Other: No abdominal wall  hernia or abnormality. No abdominopelvic ascites. Musculoskeletal: Status post left total hip arthroplasty. No acute osseous abnormality is noted. Review of the MIP images confirms the above findings. IMPRESSION: 1. No evidence of thoracic or abdominal aortic dissection or aneurysm. 2. 4 mm nodule is noted in left upper lobe. If patient is low risk for malignancy, no routine follow-up imaging is recommended. If patient is high risk for malignancy, a non-contrast chest CT at 12 months is optional.This recommendation follows the consensus statement: Guidelines for Management of Incidental Pulmonary Nodules Detected on CT Images: From the Fleischner Society 2017; Radiology 2017; 284:228-243. 3. Mild coronary artery calcifications are noted. 4. Bilateral renal cortical scarring is noted. 5. Aortic atherosclerosis. Aortic Atherosclerosis (ICD10-I70.0). Electronically Signed   By: Lynwood Landy Raddle M.D.   On: 10/22/2023 10:48   CT T-SPINE NO CHARGE Result Date: 10/22/2023 EXAM: CT THORACIC SPINE WITHOUT CONTRAST 10/22/2023 10:10:00 AM TECHNIQUE: CT of the thoracic spine was performed without the administration of intravenous contrast. Multiplanar reformatted images are provided for review. Automated exposure control, iterative reconstruction, and/or weight based adjustment of the mA/kV was utilized to reduce the radiation dose to as low as reasonably achievable. COMPARISON: None available. CLINICAL HISTORY: PT to triage with c/o right arm pain and numbness, states pain now feels like its on fire. FINDINGS: BONES AND ALIGNMENT: Normal vertebral body heights. No acute fracture or suspicious bone lesion. Normal alignment. DEGENERATIVE CHANGES: There is mild disc space narrowing and endplate bridging. There is no significant spinal canal or neural foraminal stenosis evident. SOFT TISSUES: No acute abnormality. LUNGS: There are  ground-glass and reticular opacities present dependently within the lower lobes bilaterally.  IMPRESSION: 1. No acute abnormality of the thoracic spine. 2. Mild multilevel thoracic spondylosis without significant spinal canal or neural foraminal stenosis. Electronically signed by: Evalene Coho MD 10/22/2023 10:20 AM EDT RP Workstation: [id number]   CT Head Wo Contrast Result Date: 10/22/2023 EXAM: CT HEAD WITHOUT CONTRAST 10/22/2023 10:10:00 AM TECHNIQUE: CT of the head was performed without the administration of intravenous contrast. Automated exposure control, iterative reconstruction, and/or weight based adjustment of the mA/kV was utilized to reduce the radiation dose to as low as reasonably achievable. COMPARISON: CT of the head dated 06/14/2009. CLINICAL HISTORY: Neuro deficit, acute, stroke suspected. PT to triage with c/o right arm pain and numbness, states pain now feels like its on fire. FINDINGS: BRAIN AND VENTRICLES: No acute hemorrhage. No evidence of acute infarct. No hydrocephalus. No extra-axial collection. No mass effect or midline shift. ORBITS: The patient is status post bilateral lens replacement. No acute abnormality. SINUSES: No acute abnormality. SOFT TISSUES AND SKULL: No acute soft tissue abnormality. No skull fracture. IMPRESSION: 1. No acute intracranial abnormality. Electronically signed by: Evalene Coho MD 10/22/2023 10:18 AM EDT RP Workstation: [id number]   CT Cervical Spine Wo Contrast Result Date: 10/22/2023 EXAM: CT CERVICAL SPINE WITHOUT CONTRAST 10/22/2023 10:10:00 AM TECHNIQUE: CT of the cervical spine was performed without the administration of intravenous contrast. Multiplanar reformatted images are provided for review. Automated exposure control, iterative reconstruction, and/or weight based adjustment of the mA/kV was utilized to reduce the radiation dose to as low as reasonably achievable. COMPARISON: MRI of the cervical spine dated 12/20/2021. CLINICAL HISTORY: Cervical radiculopathy, no red flags. PT to triage with c/o right arm pain and numbness,  states pain now feels like its on fire. FINDINGS: CERVICAL SPINE: BONES AND ALIGNMENT: Mild reversal of the normal cervical lordosis. No acute fracture or traumatic malalignment. DEGENERATIVE CHANGES: Multilevel degenerative disc disease and facet arthrosis. SOFT TISSUES: No prevertebral soft tissue swelling. IMPRESSION: 1. Mild reversal of the normal cervical lordosis. 2. Multilevel degenerative disc disease and facet arthrosis. Electronically signed by: Evalene Coho MD 10/22/2023 10:17 AM EDT RP Workstation: [id number]   DG Chest Port 1 View Result Date: 10/22/2023 CLINICAL DATA:  Right arm pain with numbness. EXAM: PORTABLE CHEST 1 VIEW COMPARISON:  05/23/2014 FINDINGS: Lungs are hypoinflated and otherwise clear. Cardiomediastinal silhouette and remainder the exam is unchanged. IMPRESSION: Hypoinflation without acute cardiopulmonary disease. Electronically Signed   By: Toribio Agreste M.D.   On: 10/22/2023 08:50     Procedures   Medications Ordered in the ED - No data to display                                  Medical Decision Making  Medical Decision Making:   RYLIEGH MCDUFFEY is a 68 y.o. female who presented to the ED today with right arm and neck pain, radicular paresthesia detailed above.     Complete initial physical exam performed, notably the patient  was alert and oriented in no apparent distress.  The radial pulses are equal bilaterally, no change in her status from previous exam. .    Reviewed and confirmed nursing documentation for past medical history, family history, social history.    Initial Assessment:   With the patient's presentation of right arm and neck pain, most likely diagnosis is continued sequelae of unspecified back pain previously diagnosed at previous visit.  Consider vascular  abnormality, though previous imaging obtained at visit earlier today ruled out any aneurysm, also less than likely to be due to a thoracic outlet syndrome.   Initial Study Results:     Radiology:  All images reviewed independently. Agree with radiology report at this time.   CT Angio Chest/Abd/Pel for Dissection W and/or W/WO Result Date: 10/22/2023 CLINICAL DATA:  Right arm pain and numbness. EXAM: CT ANGIOGRAPHY CHEST, ABDOMEN AND PELVIS TECHNIQUE: Non-contrast CT of the chest was initially obtained. Multidetector CT imaging through the chest, abdomen and pelvis was performed using the standard protocol during bolus administration of intravenous contrast. Multiplanar reconstructed images and MIPs were obtained and reviewed to evaluate the vascular anatomy. RADIATION DOSE REDUCTION: This exam was performed according to the departmental dose-optimization program which includes automated exposure control, adjustment of the mA and/or kV according to patient size and/or use of iterative reconstruction technique. CONTRAST:  OMNIPAQUE  IOHEXOL  350 MG/ML SOLN COMPARISON:  None Available. FINDINGS: CTA CHEST FINDINGS Cardiovascular: Preferential opacification of the thoracic aorta. No evidence of thoracic aortic aneurysm or dissection. Normal heart size. No pericardial effusion. Mild coronary artery calcifications are noted. Mediastinum/Nodes: No enlarged mediastinal, hilar, or axillary lymph nodes. Thyroid  gland, trachea, and esophagus demonstrate no significant findings. Lungs/Pleura: No pneumothorax or pleural effusion is noted. 4 mm nodule is noted in left upper lobe best seen on image number 56 of series 6. No consolidative process is noted. Musculoskeletal: No chest wall abnormality. No acute or significant osseous findings. Review of the MIP images confirms the above findings. CTA ABDOMEN AND PELVIS FINDINGS VASCULAR Aorta: Atherosclerosis of abdominal aorta is noted without aneurysm or dissection. Celiac: Patent without evidence of aneurysm, dissection, vasculitis or significant stenosis. SMA: Patent without evidence of aneurysm, dissection, vasculitis or significant stenosis. Renals:  Both renal arteries are patent without evidence of aneurysm, dissection, vasculitis, fibromuscular dysplasia or significant stenosis. IMA: Patent without evidence of aneurysm, dissection, vasculitis or significant stenosis. Inflow: Patent without evidence of aneurysm, dissection, vasculitis or significant stenosis. Veins: No obvious venous abnormality within the limitations of this arterial phase study. Review of the MIP images confirms the above findings. NON-VASCULAR Hepatobiliary: No focal liver abnormality is seen. Status post cholecystectomy. No biliary dilatation. Pancreas: Unremarkable. No pancreatic ductal dilatation or surrounding inflammatory changes. Spleen: Normal in size without focal abnormality. Adrenals/Urinary Tract: Adrenal glands appear normal. Bilateral renal cortical scarring is noted. Left renal cyst is noted. No hydronephrosis or renal obstruction is noted. Urinary bladder is unremarkable. Stomach/Bowel: Stomach is unremarkable. Status post appendectomy. No evidence of bowel obstruction or inflammation. Lymphatic: No adenopathy is noted. Reproductive: Status post hysterectomy. No adnexal masses. Other: No abdominal wall hernia or abnormality. No abdominopelvic ascites. Musculoskeletal: Status post left total hip arthroplasty. No acute osseous abnormality is noted. Review of the MIP images confirms the above findings. IMPRESSION: 1. No evidence of thoracic or abdominal aortic dissection or aneurysm. 2. 4 mm nodule is noted in left upper lobe. If patient is low risk for malignancy, no routine follow-up imaging is recommended. If patient is high risk for malignancy, a non-contrast chest CT at 12 months is optional.This recommendation follows the consensus statement: Guidelines for Management of Incidental Pulmonary Nodules Detected on CT Images: From the Fleischner Society 2017; Radiology 2017; 284:228-243. 3. Mild coronary artery calcifications are noted. 4. Bilateral renal cortical scarring is  noted. 5. Aortic atherosclerosis. Aortic Atherosclerosis (ICD10-I70.0). Electronically Signed   By: Lynwood Landy Raddle M.D.   On: 10/22/2023 10:48   CT T-SPINE NO CHARGE  Result Date: 10/22/2023 EXAM: CT THORACIC SPINE WITHOUT CONTRAST 10/22/2023 10:10:00 AM TECHNIQUE: CT of the thoracic spine was performed without the administration of intravenous contrast. Multiplanar reformatted images are provided for review. Automated exposure control, iterative reconstruction, and/or weight based adjustment of the mA/kV was utilized to reduce the radiation dose to as low as reasonably achievable. COMPARISON: None available. CLINICAL HISTORY: PT to triage with c/o right arm pain and numbness, states pain now feels like its on fire. FINDINGS: BONES AND ALIGNMENT: Normal vertebral body heights. No acute fracture or suspicious bone lesion. Normal alignment. DEGENERATIVE CHANGES: There is mild disc space narrowing and endplate bridging. There is no significant spinal canal or neural foraminal stenosis evident. SOFT TISSUES: No acute abnormality. LUNGS: There are ground-glass and reticular opacities present dependently within the lower lobes bilaterally. IMPRESSION: 1. No acute abnormality of the thoracic spine. 2. Mild multilevel thoracic spondylosis without significant spinal canal or neural foraminal stenosis. Electronically signed by: Evalene Coho MD 10/22/2023 10:20 AM EDT RP Workstation: [id number]   CT Head Wo Contrast Result Date: 10/22/2023 EXAM: CT HEAD WITHOUT CONTRAST 10/22/2023 10:10:00 AM TECHNIQUE: CT of the head was performed without the administration of intravenous contrast. Automated exposure control, iterative reconstruction, and/or weight based adjustment of the mA/kV was utilized to reduce the radiation dose to as low as reasonably achievable. COMPARISON: CT of the head dated 06/14/2009. CLINICAL HISTORY: Neuro deficit, acute, stroke suspected. PT to triage with c/o right arm pain and numbness, states  pain now feels like its on fire. FINDINGS: BRAIN AND VENTRICLES: No acute hemorrhage. No evidence of acute infarct. No hydrocephalus. No extra-axial collection. No mass effect or midline shift. ORBITS: The patient is status post bilateral lens replacement. No acute abnormality. SINUSES: No acute abnormality. SOFT TISSUES AND SKULL: No acute soft tissue abnormality. No skull fracture. IMPRESSION: 1. No acute intracranial abnormality. Electronically signed by: Evalene Coho MD 10/22/2023 10:18 AM EDT RP Workstation: [id number]   CT Cervical Spine Wo Contrast Result Date: 10/22/2023 EXAM: CT CERVICAL SPINE WITHOUT CONTRAST 10/22/2023 10:10:00 AM TECHNIQUE: CT of the cervical spine was performed without the administration of intravenous contrast. Multiplanar reformatted images are provided for review. Automated exposure control, iterative reconstruction, and/or weight based adjustment of the mA/kV was utilized to reduce the radiation dose to as low as reasonably achievable. COMPARISON: MRI of the cervical spine dated 12/20/2021. CLINICAL HISTORY: Cervical radiculopathy, no red flags. PT to triage with c/o right arm pain and numbness, states pain now feels like its on fire. FINDINGS: CERVICAL SPINE: BONES AND ALIGNMENT: Mild reversal of the normal cervical lordosis. No acute fracture or traumatic malalignment. DEGENERATIVE CHANGES: Multilevel degenerative disc disease and facet arthrosis. SOFT TISSUES: No prevertebral soft tissue swelling. IMPRESSION: 1. Mild reversal of the normal cervical lordosis. 2. Multilevel degenerative disc disease and facet arthrosis. Electronically signed by: Evalene Coho MD 10/22/2023 10:17 AM EDT RP Workstation: [id number]   DG Chest Port 1 View Result Date: 10/22/2023 CLINICAL DATA:  Right arm pain with numbness. EXAM: PORTABLE CHEST 1 VIEW COMPARISON:  05/23/2014 FINDINGS: Lungs are hypoinflated and otherwise clear. Cardiomediastinal silhouette and remainder the exam is  unchanged. IMPRESSION: Hypoinflation without acute cardiopulmonary disease. Electronically Signed   By: Toribio Agreste M.D.   On: 10/22/2023 08:50   US  RENAL Result Date: 10/13/2023 CLINICAL DATA:  Initial evaluation for chronic kidney disease, stage III. EXAM: RENAL / URINARY TRACT ULTRASOUND COMPLETE COMPARISON:  Comparison made with prior ultrasound from 05/23/2014. FINDINGS: Right Kidney: Renal measurements: 9.7 x 4.4 x  5.4 cm = volume: 120.0 mL. Increased echogenicity within the renal parenchyma. No nephrolithiasis or hydronephrosis. No focal renal mass. Left Kidney: Renal measurements: 10.9 x 5.4 x 4.9 cm = volume: 151.0 mL. Increased echogenicity within the renal parenchyma. No nephrolithiasis or hydronephrosis. 1.3 x 1.3 x 1.0 cm mildly complex cyst present at the lower pole. Bladder: Appears normal for degree of bladder distention. Bilateral jets are visualized. Other: None. IMPRESSION: 1. Increased echogenicity within the renal parenchyma, compatible with medical renal disease. 2. No hydronephrosis. 3. 1.3 cm mildly complex cyst at the lower pole of the left kidney. Short interval follow-up ultrasound in 6 months is recommended for further evaluation and to ensure stability. Electronically Signed   By: Morene Hoard M.D.   On: 10/13/2023 06:01   XR HIP UNILAT W OR W/O PELVIS 2-3 VIEWS RIGHT Result Date: 09/30/2023 AP pelvis lateral view right hip: Bilateral hips well located. 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 No acute fracture or acute findings. Right hip with moderate moderate to severe narrowing with impingement secondary to superior acetabular osteophyte. No bony abnormalities otherwise.    Reassessment and Plan:   Patient does not have any appreciable changes from previous exam.  Discussed with patient the incidental findings, need to follow-up with primary care regarding this as well as follow-up with her primary care for her continued symptoms.  Will  also manage neuropathic pain with gabapentin she can continue to use over-the-counter pain medication as well as previously prescribed medications for pain relief, follow-up with primary care within the next 2 weeks.  Reassurance provided, patient agrees with the care plan at this time and has no further concerns.       Final diagnoses:  Back pain, unspecified back location, unspecified back pain laterality, unspecified chronicity  Cervical radiculopathy    ED Discharge Orders          Ordered    gabapentin (NEURONTIN) 300 MG capsule  2 times daily        10/22/23 2010               Myriam Dorn BROCK[place], [place] 10/22/23 2014    Patt Alm Macho, MD 10/22/23 [id number]

## 2023-10-24 ENCOUNTER — Other Ambulatory Visit (HOSPITAL_COMMUNITY): Payer: Self-pay

## 2023-10-25 ENCOUNTER — Other Ambulatory Visit: Payer: Self-pay | Admitting: Physical Medicine and Rehabilitation

## 2023-10-25 ENCOUNTER — Other Ambulatory Visit (HOSPITAL_COMMUNITY): Payer: Self-pay

## 2023-10-25 ENCOUNTER — Telehealth: Payer: Self-pay

## 2023-10-25 MED ORDER — DIAZEPAM 5 MG PO TABS
ORAL_TABLET | ORAL | 0 refills | Status: DC
  Filled 2023-10-25: qty 1, 1d supply, fill #0

## 2023-10-25 NOTE — Telephone Encounter (Signed)
 Patient requesting pre procedural Valium 

## 2023-10-26 DIAGNOSIS — I10 Essential (primary) hypertension: Secondary | ICD-10-CM | POA: Diagnosis not present

## 2023-10-26 DIAGNOSIS — E039 Hypothyroidism, unspecified: Secondary | ICD-10-CM | POA: Diagnosis not present

## 2023-10-31 ENCOUNTER — Other Ambulatory Visit (HOSPITAL_COMMUNITY): Payer: Self-pay

## 2023-11-01 ENCOUNTER — Other Ambulatory Visit (HOSPITAL_COMMUNITY): Payer: Self-pay

## 2023-11-01 MED ORDER — OMEPRAZOLE 20 MG PO CPDR
20.0000 mg | DELAYED_RELEASE_CAPSULE | Freq: Two times a day (BID) | ORAL | 1 refills | Status: DC
  Filled 2023-11-01: qty 180, 90d supply, fill #0
  Filled 2024-01-26: qty 180, 90d supply, fill #1
  Filled 2024-01-27: qty 180, 90d supply, fill #0

## 2023-11-15 ENCOUNTER — Ambulatory Visit: Admitting: Physical Medicine and Rehabilitation

## 2023-11-15 ENCOUNTER — Other Ambulatory Visit: Payer: Self-pay

## 2023-11-15 VITALS — BP 157/91 | HR 57

## 2023-11-15 DIAGNOSIS — M47816 Spondylosis without myelopathy or radiculopathy, lumbar region: Secondary | ICD-10-CM

## 2023-11-15 MED ORDER — METHYLPREDNISOLONE ACETATE 40 MG/ML IJ SUSP
40.0000 mg | Freq: Once | INTRAMUSCULAR | Status: AC
  Administered 2023-11-15: 40 mg

## 2023-11-15 NOTE — Progress Notes (Signed)
 Theresa Nolan - 68 y.o. female MRN [id number]  Date of birth: 09-13-1955  Office Visit Note: Visit Date: 11/15/2023 PCP: Leonel Cole, MD Referred by: Leonel Cole, MD  Subjective: Chief Complaint  Patient presents with   Lower Back - Pain   HPI:  Theresa Nolan is a 68 y.o. female who comes in today at the request of Duwaine Pouch, FNP for planned Right  L4-5 and L5-S1 Lumbar facet/medial branch block with fluoroscopic guidance.  The patient has failed conservative care including home exercise, medications, time and activity modification.  This injection will be diagnostic and hopefully therapeutic.  Please see requesting physician notes for further details and justification.  Exam has shown concordant pain with facet joint loading.   ROS Otherwise per HPI.  Assessment & Plan: Visit Diagnoses:    ICD-10-CM   1. Spondylosis without myelopathy or radiculopathy, lumbar region  M47.816 XR C-ARM NO REPORT    Facet Injection    methylPREDNISolone  acetate (DEPO-MEDROL ) injection 40 mg      Plan: No additional findings.   Meds & Orders:  Meds ordered this encounter  Medications   methylPREDNISolone  acetate (DEPO-MEDROL ) injection 40 mg    Orders Placed This Encounter  Procedures   Facet Injection   XR C-ARM NO REPORT    Follow-up: Return for visit to requesting provider as needed.   Procedures: No procedures performed  Lumbar Facet Joint Intra-Articular Injection(s) with Fluoroscopic Guidance  Patient: Theresa Nolan      Date of Birth: March 20, 1955 MRN: [id number] PCP: Leonel Cole, MD      Visit Date: 11/15/2023   Universal Protocol:    Date/Time: 11/15/2023  Consent Given By: the patient  Position: PRONE   Additional Comments: Vital signs were monitored before and after the procedure. Patient was prepped and draped in the usual sterile fashion. The correct patient, procedure, and site was verified.   Injection Procedure Details:  Procedure Site One Meds  Administered:  Meds ordered this encounter  Medications   methylPREDNISolone  acetate (DEPO-MEDROL ) injection 40 mg     Laterality: Right  Location/Site:  L4-L5 L5-S1  Needle size: 22 guage  Needle type: Spinal  Needle Placement: Articular  Findings:  -Comments: Excellent flow of contrast producing a partial arthrogram.  Procedure Details: The fluoroscope beam is vertically oriented in AP, and the inferior recess is visualized beneath the lower pole of the inferior apophyseal process, which represents the target point for needle insertion. When direct visualization is difficult the target point is located at the medial projection of the vertebral pedicle. The region overlying each aforementioned target is locally anesthetized with a 1 to 2 ml. volume of 1% Lidocaine  without Epinephrine .   The spinal needle was inserted into each of the above mentioned facet joints using biplanar fluoroscopic guidance. A 0.25 to 0.5 ml. volume of Isovue -250 was injected and a partial facet joint arthrogram was obtained. A single spot film was obtained of the resulting arthrogram.    One to 1.25 ml of the steroid/anesthetic solution was then injected into each of the facet joints noted above.   Additional Comments:  The patient tolerated the procedure well Dressing: 2 x 2 sterile gauze and Band-Aid    Post-procedure details: Patient was observed during the procedure. Post-procedure instructions were reviewed.  Patient left the clinic in stable condition.    Clinical History: MRI LUMBAR SPINE WITHOUT CONTRAST   TECHNIQUE: Multiplanar, multisequence MR imaging of the lumbar spine was performed. No intravenous contrast was administered.  COMPARISON:  Lumbar radiographs 08/07/2020. MRI 10/14/2012.   FINDINGS: Segmentation: Normal on the 2022 radiographs, the same numbering system used on the 2014 MRI.   Alignment: Stable chronic lumbar lordosis. Mild dextroconvex lumbar scoliosis  demonstrated on the radiographs last year is less apparent now. However, subtle anterolisthesis has developed at L4-L5 since 2014. See additional details of that level below.   Vertebrae: No marrow edema or evidence of acute osseous abnormality. Normal background bone marrow signal. Intact visible sacrum and SI joints.   Conus medullaris and cauda equina: Conus extends to the L1-L2 level. No lower spinal cord or conus signal abnormality.   Paraspinal and other soft tissues: Negative.   Disc levels:   T10-T11: Mild chronic ligament flavum hypertrophy without stenosis.   T11-T12: Minor disc bulging and facet hypertrophy. No stenosis.   T12-L1:  Negative.   L1-L2: Chronic left paracentral disc bulge or protrusion here mildly effaces the ventral CSF space (series 9, image 10), but there is no convincing spinal or foraminal stenosis.   L2-L3: Negative disc. Mild to moderate facet hypertrophy has increased since 2014. But there is no significant stenosis.   L3-L4: Mild circumferential disc bulge. Mild to moderate facet and ligament flavum hypertrophy. Tiny posteriorly situated synovial cysts which should not cause neural compromise. No spinal or lateral recess stenosis. Mild left and moderate right L3 foraminal stenosis have not significantly changed.   L4-L5: Subtle anterolisthesis. Increased circumferential disc bulge, and right foraminal component of disc (series 7, image 5). Increased moderate facet and ligament flavum hypertrophy, degenerative facet joint fluid. Small posteriorly situated synovial cysts bilaterally which should not cause neural compromise. No significant spinal or lateral recess stenosis. Mild to moderate left L4 foraminal stenosis is chronic and stable. But moderate right L4 foraminal stenosis is new since 2014.   L5-S1: Increased circumferential disc bulge, especially left foraminal disc and endplate spurring. Moderate to severe facet hypertrophy has  progressed and is greater on the left (series 9, image 34) with bilateral degenerative facet joint fluid. Small left-side subchondral synovial cyst but right side synovial cyst projecting toward the right neural foramina (series 9, image 33). No spinal or lateral recess stenosis. Increased and moderate left L4 foraminal stenosis. Mild right L5 foraminal stenosis appears stable aside from the small new synovial cyst.   IMPRESSION: 1. Subtle anterolisthesis has developed at L4-L5 since 2014, with progressed facet disease there and especially at L5-S1. And increased foraminal disc at both levels. No spinal or lateral recess stenosis at either level, but progressed and moderate neural foraminal stenosis at the right L4 and the bilateral L5 nerve levels.   2. No significant lumbar spinal or lateral recess stenosis, and other lumbar levels have not significantly changed since 2014.     Electronically Signed   By: VEAR Hurst M.D.   On: 03/17/2021 10:07     Objective:  VS:  HT:    WT:   BMI:     BP:(!) 157/91  HR:(!) 57bpm  TEMP: ( )  RESP:  Physical Exam Vitals and nursing note reviewed.  Constitutional:      General: She is not in acute distress.    Appearance: Normal appearance. She is not ill-appearing.  HENT:     Head: Normocephalic and atraumatic.     Right Ear: External ear normal.     Left Ear: External ear normal.  Eyes:     Extraocular Movements: Extraocular movements intact.  Cardiovascular:     Rate and Rhythm: Normal rate.  Pulses: Normal pulses.  Pulmonary:     Effort: Pulmonary effort is normal. No respiratory distress.  Abdominal:     General: There is no distension.     Palpations: Abdomen is soft.  Musculoskeletal:        General: Tenderness present.     Cervical back: Neck supple.     Right lower leg: No edema.     Left lower leg: No edema.     Comments: Patient has good distal strength with no pain over the greater trochanters.  No clonus or focal  weakness.  Skin:    Findings: No erythema, lesion or rash.  Neurological:     General: No focal deficit present.     Mental Status: She is alert and oriented to person, place, and time.     Sensory: No sensory deficit.     Motor: No weakness or abnormal muscle tone.     Coordination: Coordination normal.  Psychiatric:        Mood and Affect: Mood normal.        Behavior: Behavior normal.      Imaging: No results found.

## 2023-11-15 NOTE — Progress Notes (Signed)
 Pain Scale   Average Pain 7 Patient advised she has Lower back pain radiating to bilateral hip areas, patient advising her pain is constant        +Driver, -BT, -Dye Allergies.

## 2023-11-15 NOTE — Procedures (Signed)
 Lumbar Facet Joint Intra-Articular Injection(s) with Fluoroscopic Guidance  Patient: Theresa Nolan      Date of Birth: November 25, 1955 MRN: [id number] PCP: Leonel Cole, MD      Visit Date: 11/15/2023   Universal Protocol:    Date/Time: 11/15/2023  Consent Given By: the patient  Position: PRONE   Additional Comments: Vital signs were monitored before and after the procedure. Patient was prepped and draped in the usual sterile fashion. The correct patient, procedure, and site was verified.   Injection Procedure Details:  Procedure Site One Meds Administered:  Meds ordered this encounter  Medications   methylPREDNISolone  acetate (DEPO-MEDROL ) injection 40 mg     Laterality: Right  Location/Site:  L4-L5 L5-S1  Needle size: 22 guage  Needle type: Spinal  Needle Placement: Articular  Findings:  -Comments: Excellent flow of contrast producing a partial arthrogram.  Procedure Details: The fluoroscope beam is vertically oriented in AP, and the inferior recess is visualized beneath the lower pole of the inferior apophyseal process, which represents the target point for needle insertion. When direct visualization is difficult the target point is located at the medial projection of the vertebral pedicle. The region overlying each aforementioned target is locally anesthetized with a 1 to 2 ml. volume of 1% Lidocaine  without Epinephrine .   The spinal needle was inserted into each of the above mentioned facet joints using biplanar fluoroscopic guidance. A 0.25 to 0.5 ml. volume of Isovue -250 was injected and a partial facet joint arthrogram was obtained. A single spot film was obtained of the resulting arthrogram.    One to 1.25 ml of the steroid/anesthetic solution was then injected into each of the facet joints noted above.   Additional Comments:  The patient tolerated the procedure well Dressing: 2 x 2 sterile gauze and Band-Aid    Post-procedure details: Patient was observed  during the procedure. Post-procedure instructions were reviewed.  Patient left the clinic in stable condition.

## 2023-11-17 ENCOUNTER — Other Ambulatory Visit (HOSPITAL_COMMUNITY): Payer: Self-pay

## 2023-11-17 MED ORDER — GABAPENTIN 300 MG PO CAPS
300.0000 mg | ORAL_CAPSULE | Freq: Two times a day (BID) | ORAL | 0 refills | Status: DC
  Filled 2023-11-17: qty 60, 30d supply, fill #0

## 2023-11-22 ENCOUNTER — Encounter: Payer: Self-pay | Admitting: Radiology

## 2023-11-23 ENCOUNTER — Other Ambulatory Visit: Payer: Self-pay

## 2023-11-23 ENCOUNTER — Other Ambulatory Visit (HOSPITAL_COMMUNITY): Payer: Self-pay

## 2023-11-23 MED ORDER — TRAMADOL HCL 50 MG PO TABS
50.0000 mg | ORAL_TABLET | Freq: Three times a day (TID) | ORAL | 0 refills | Status: DC | PRN
  Filled 2023-11-23: qty 90, 30d supply, fill #0

## 2023-11-25 DIAGNOSIS — Z961 Presence of intraocular lens: Secondary | ICD-10-CM | POA: Diagnosis not present

## 2023-11-25 DIAGNOSIS — H524 Presbyopia: Secondary | ICD-10-CM | POA: Diagnosis not present

## 2023-11-25 DIAGNOSIS — H52221 Regular astigmatism, right eye: Secondary | ICD-10-CM | POA: Diagnosis not present

## 2023-11-25 DIAGNOSIS — H5319 Other subjective visual disturbances: Secondary | ICD-10-CM | POA: Diagnosis not present

## 2023-11-25 DIAGNOSIS — H5203 Hypermetropia, bilateral: Secondary | ICD-10-CM | POA: Diagnosis not present

## 2023-11-25 DIAGNOSIS — H43813 Vitreous degeneration, bilateral: Secondary | ICD-10-CM | POA: Diagnosis not present

## 2023-11-30 DIAGNOSIS — I129 Hypertensive chronic kidney disease with stage 1 through stage 4 chronic kidney disease, or unspecified chronic kidney disease: Secondary | ICD-10-CM | POA: Diagnosis not present

## 2023-11-30 DIAGNOSIS — N1831 Chronic kidney disease, stage 3a: Secondary | ICD-10-CM | POA: Diagnosis not present

## 2023-11-30 DIAGNOSIS — E669 Obesity, unspecified: Secondary | ICD-10-CM | POA: Diagnosis not present

## 2023-11-30 DIAGNOSIS — N179 Acute kidney failure, unspecified: Secondary | ICD-10-CM | POA: Diagnosis not present

## 2023-11-30 DIAGNOSIS — R7689 Other specified abnormal immunological findings in serum: Secondary | ICD-10-CM | POA: Diagnosis not present

## 2023-12-07 DIAGNOSIS — Z23 Encounter for immunization: Secondary | ICD-10-CM | POA: Diagnosis not present

## 2023-12-07 DIAGNOSIS — M25559 Pain in unspecified hip: Secondary | ICD-10-CM | POA: Diagnosis not present

## 2023-12-07 DIAGNOSIS — M79601 Pain in right arm: Secondary | ICD-10-CM | POA: Diagnosis not present

## 2023-12-07 DIAGNOSIS — R911 Solitary pulmonary nodule: Secondary | ICD-10-CM | POA: Diagnosis not present

## 2023-12-07 IMAGING — MG MM DIGITAL SCREENING BILAT W/ TOMO AND CAD
6 of 10 series · 6 of 30 positions shown · non-contrast
Comparison: Previous exam(s).

ACR Breast Density Category a: The breast tissue is almost entirely
fatty.

CLINICAL DATA: Screening.

EXAM:
DIGITAL SCREENING BILATERAL MAMMOGRAM WITH TOMOSYNTHESIS AND CAD
TECHNIQUE: Bilateral screening digital craniocaudal and mediolateral oblique
mammograms were obtained. Bilateral screening digital breast
tomosynthesis was performed. The images were evaluated with
computer-aided detection.

[R CC synth-2D]
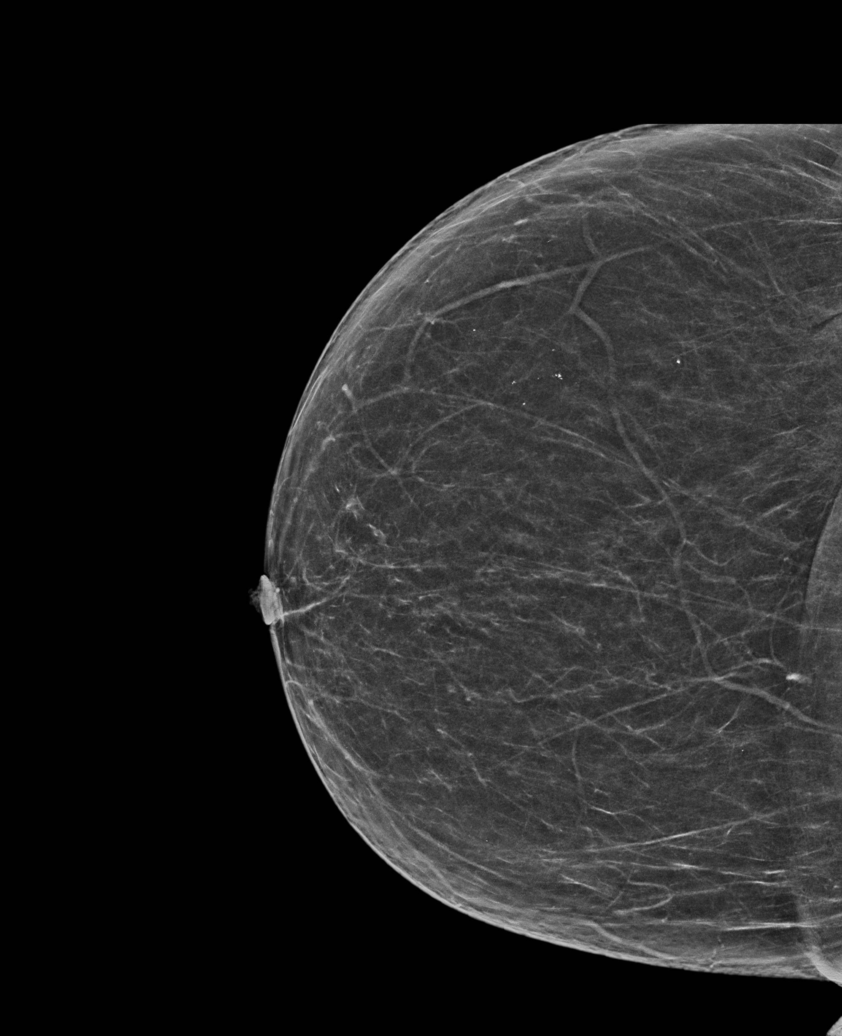

[L CC synth-2D (1 of 2)]
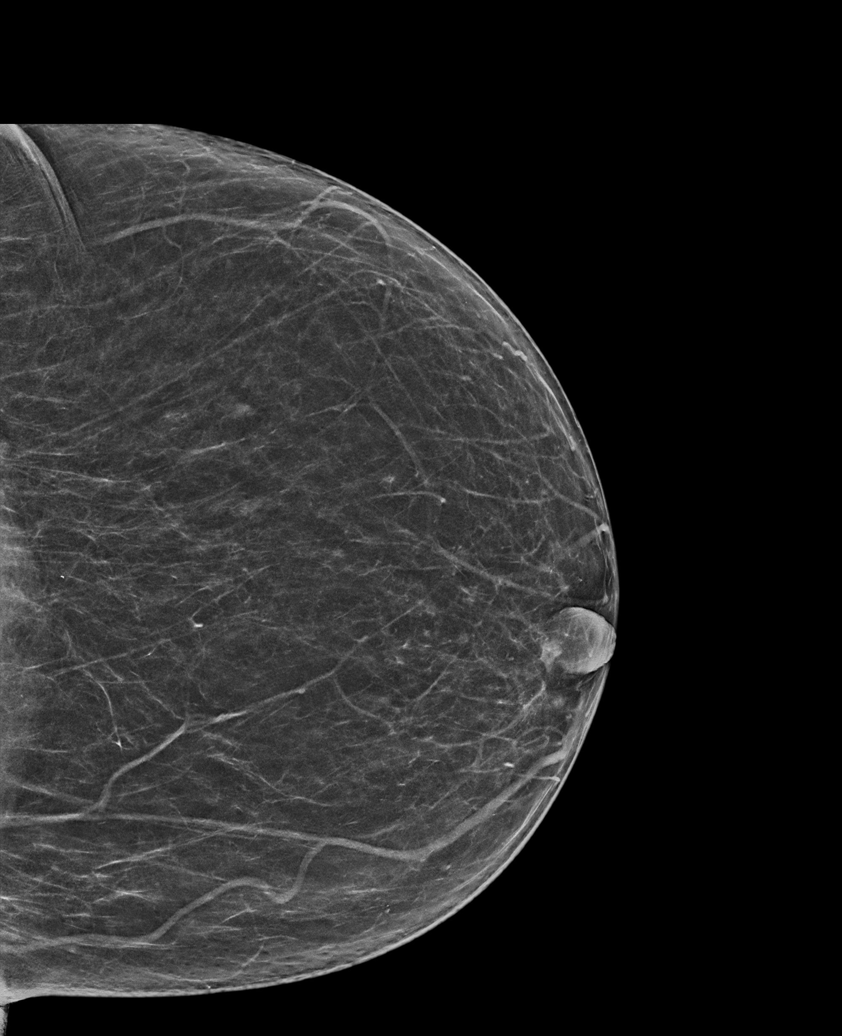

[R MLO synth-2D]
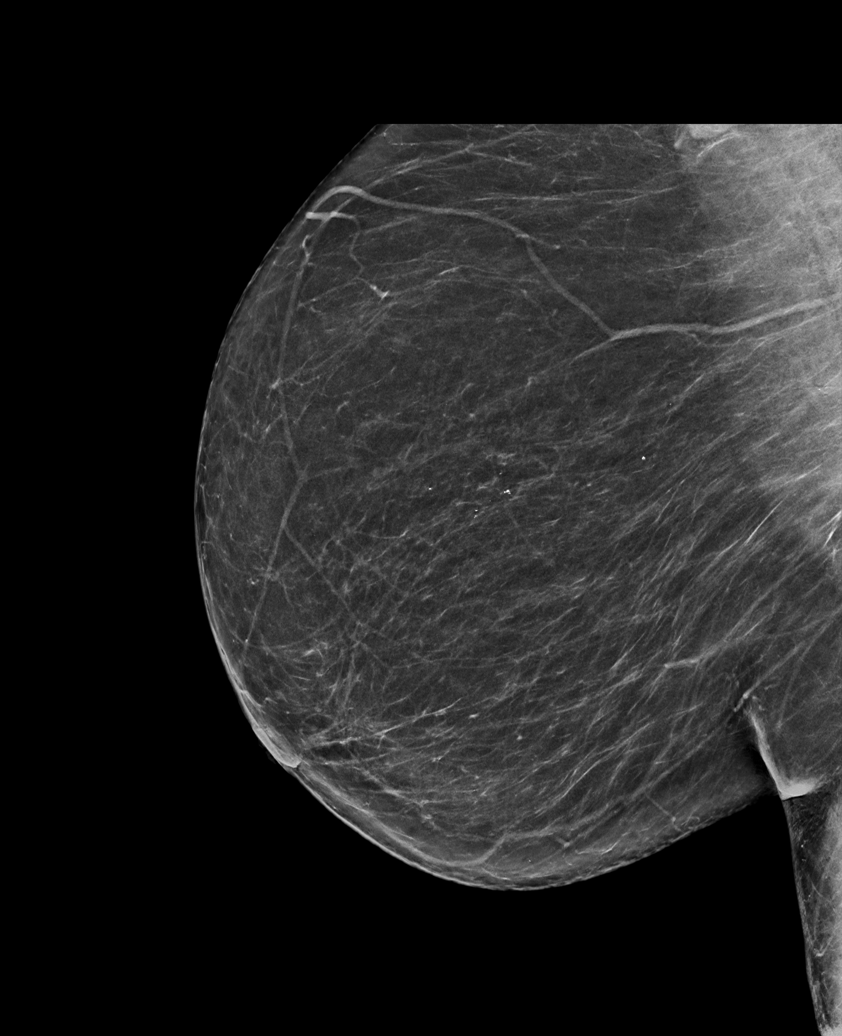

[L CC synth-2D (2 of 2)]
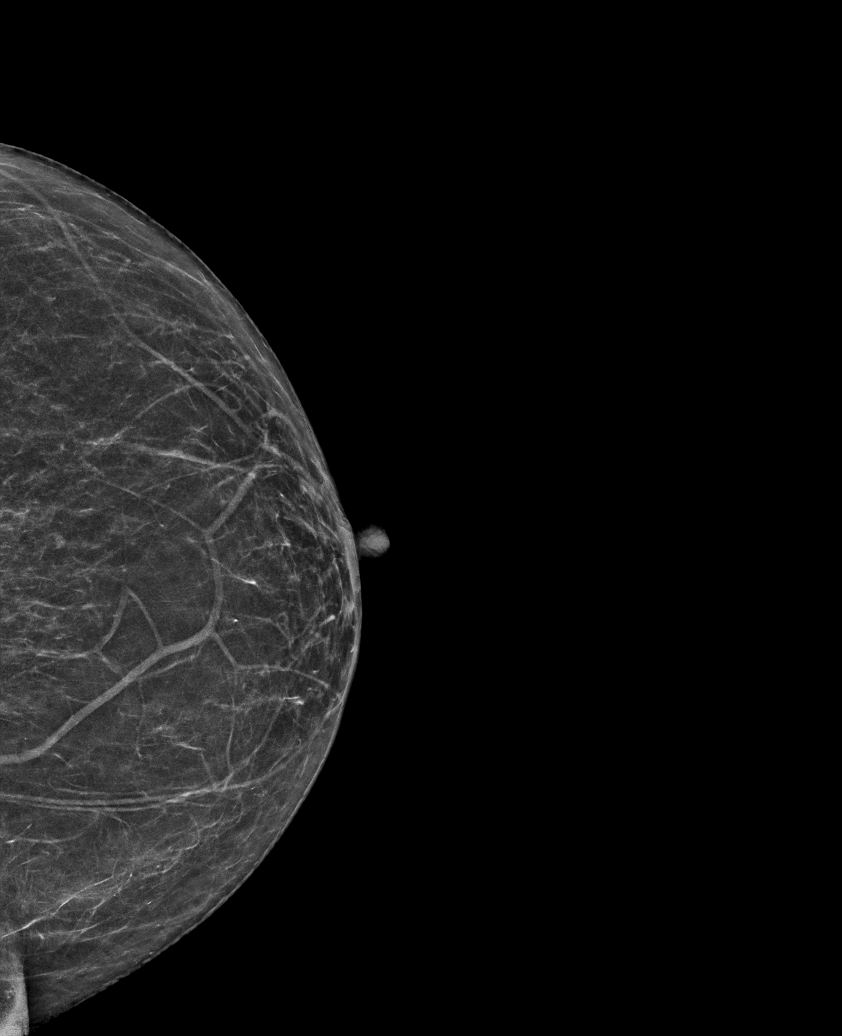

[L MLO synth-2D]
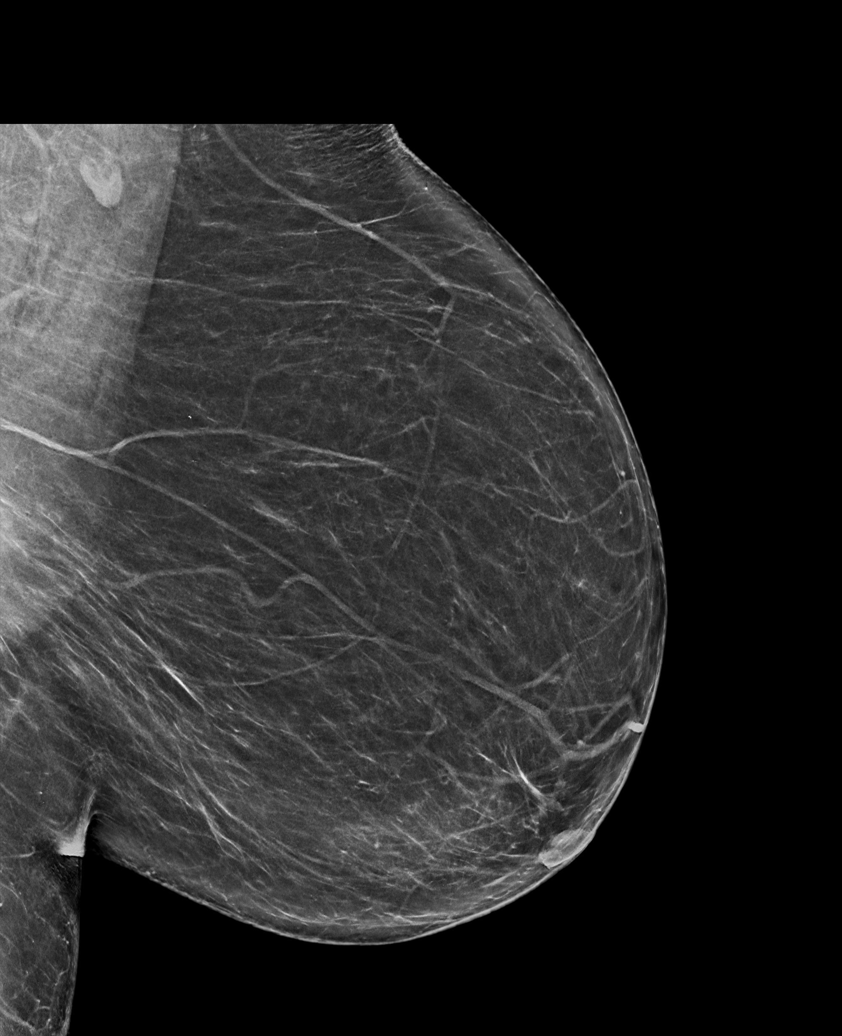

[R MLO tomo · tomo slice 37/72.0]
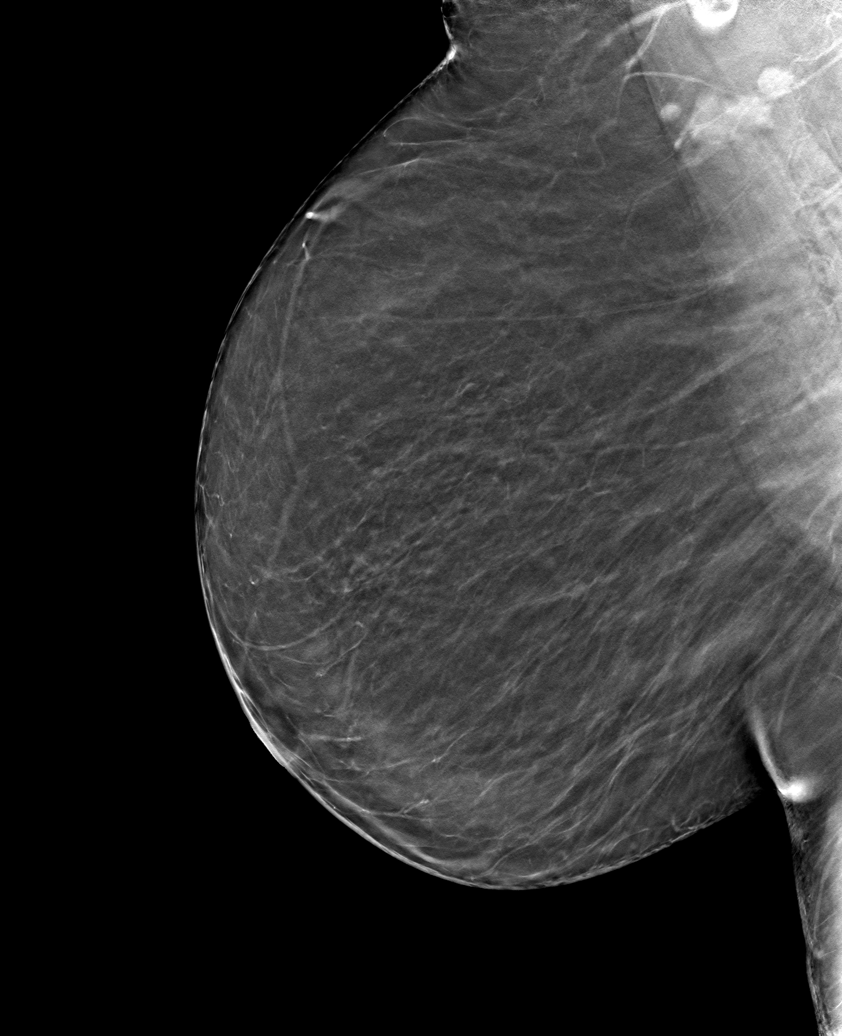

[6 of 30 positions shown; findings below may reference images not displayed]

FINDINGS: There are no findings suspicious for malignancy.
IMPRESSION: No mammographic evidence of malignancy. A result letter of this
screening mammogram will be mailed directly to the patient.

RECOMMENDATION:
Screening mammogram in one year. (Code:0E-3-N98)

BI-RADS CATEGORY  1: Negative.

## 2023-12-10 ENCOUNTER — Other Ambulatory Visit: Payer: Self-pay

## 2023-12-10 ENCOUNTER — Other Ambulatory Visit (HOSPITAL_COMMUNITY): Payer: Self-pay

## 2023-12-15 ENCOUNTER — Other Ambulatory Visit (HOSPITAL_COMMUNITY): Payer: Self-pay

## 2023-12-15 ENCOUNTER — Other Ambulatory Visit: Payer: Self-pay

## 2023-12-15 ENCOUNTER — Other Ambulatory Visit: Payer: Self-pay | Admitting: Physician Assistant

## 2023-12-15 MED ORDER — GABAPENTIN 300 MG PO CAPS
300.0000 mg | ORAL_CAPSULE | Freq: Two times a day (BID) | ORAL | 0 refills | Status: DC
  Filled 2023-12-15: qty 60, 30d supply, fill #0

## 2023-12-19 MED FILL — Methocarbamol Tab 750 MG: ORAL | 20 days supply | Qty: 60 | Fill #0 | Status: AC

## 2023-12-20 ENCOUNTER — Encounter (HOSPITAL_COMMUNITY): Payer: Self-pay

## 2023-12-20 ENCOUNTER — Other Ambulatory Visit: Payer: Self-pay | Admitting: Physician Assistant

## 2023-12-20 ENCOUNTER — Other Ambulatory Visit (HOSPITAL_COMMUNITY): Payer: Self-pay

## 2023-12-23 ENCOUNTER — Encounter: Payer: Self-pay | Admitting: Physician Assistant

## 2023-12-23 ENCOUNTER — Ambulatory Visit: Admitting: Physician Assistant

## 2023-12-23 ENCOUNTER — Other Ambulatory Visit (HOSPITAL_COMMUNITY): Payer: Self-pay

## 2023-12-23 ENCOUNTER — Encounter: Payer: Self-pay | Admitting: Radiology

## 2023-12-23 ENCOUNTER — Encounter (HOSPITAL_COMMUNITY): Payer: Self-pay

## 2023-12-23 VITALS — Ht 67.0 in | Wt 244.8 lb

## 2023-12-23 DIAGNOSIS — M1611 Unilateral primary osteoarthritis, right hip: Secondary | ICD-10-CM

## 2023-12-23 NOTE — Progress Notes (Signed)
 HPI: Theresa Nolan comes in today wanting to discuss right total hip arthroplasty.  She has a history of left total hip arthroplasty was performed 2019.  Left hip is overall doing well.  She had no complications postoperatively with her left hip.  Right groin pain she describes it as being 8 out of 10 at worst.  She uses no assistive device to ambulate.  She has tried tramadol  Robaxin  and Tylenol  and this gives her very minimal relief.  She had an injection intra-articular right hip on 09/30/2023 and it gave her relief of about 55% for about 2 weeks and then her pain returned.  She has had no new injury to either hip.  Denies any fevers chills.  Review of systems: See HPI otherwise negative  Physical exam:  Height 5 foot 7 weight 244.8 pounds BMI 38.34 General Well-developed well-nourished female no acute distress. Left hip excellent range of motion without pain.  Right hip pain with internal and external rotation.  Rotation slightly limited particularly with internal rotation compared to the left hip.  Right calf supple nontender dorsiflexion plantarflexion right ankle intact.  Radiographs dated 09/30/2023 AP pelvis and lateral view of the right hip which showed moderate to moderately severe narrowing with impingement secondary to a superior acetabular osteophyte.  No bony abnormalities otherwise.  The left total hip arthroplasty components were well-seated without any signs of complication.  Impression: Right hip osteoarthritis  Plan: Given the fact that patient's pain is inhibiting her activities of daily living including working recommend that she be placed out of work until she can undergo total hip replacement.  She will begin the disability paperwork through [employer].  Will work on getting her scheduled for a right total hip arthroplasty in the near future.  Questions were encouraged and answered.  She understands the risk benefits of surgery.  Did review the risk benefits of surgery which include DVT  PE, wound healing problems, infection, blood loss, leg length discrepancy and nerve/ vessel injury.

## 2023-12-23 NOTE — Addendum Note (Signed)
 Addended by: GRETTA FORTE on: 12/23/2023 05:58 PM   Modules accepted: Orders

## 2023-12-24 ENCOUNTER — Other Ambulatory Visit (HOSPITAL_COMMUNITY): Payer: Self-pay

## 2023-12-25 ENCOUNTER — Other Ambulatory Visit (HOSPITAL_COMMUNITY): Payer: Self-pay

## 2023-12-25 ENCOUNTER — Other Ambulatory Visit: Payer: Self-pay

## 2023-12-27 ENCOUNTER — Other Ambulatory Visit (HOSPITAL_COMMUNITY): Payer: Self-pay

## 2023-12-27 DIAGNOSIS — M79603 Pain in arm, unspecified: Secondary | ICD-10-CM | POA: Diagnosis not present

## 2023-12-27 MED ORDER — LOSARTAN POTASSIUM 100 MG PO TABS
100.0000 mg | ORAL_TABLET | Freq: Every day | ORAL | 1 refills | Status: AC
  Filled 2023-12-27: qty 90, 90d supply, fill #0

## 2023-12-27 MED ORDER — SIMVASTATIN 40 MG PO TABS
40.0000 mg | ORAL_TABLET | Freq: Every evening | ORAL | 1 refills | Status: AC
  Filled 2023-12-27: qty 90, 90d supply, fill #0

## 2024-01-07 ENCOUNTER — Telehealth: Payer: Self-pay | Admitting: Orthopaedic Surgery

## 2024-01-07 NOTE — Telephone Encounter (Signed)
 Received call from patient. She was inquiring the status of [place] form. I advised patient that we have not received anything from the [place]. She has it on her phone and will email to me. She will come by the office to sign auth and pay the form fee.

## 2024-01-09 ENCOUNTER — Other Ambulatory Visit (HOSPITAL_COMMUNITY): Payer: Self-pay

## 2024-01-10 ENCOUNTER — Other Ambulatory Visit (HOSPITAL_COMMUNITY): Payer: Self-pay

## 2024-01-10 ENCOUNTER — Encounter (HOSPITAL_COMMUNITY): Payer: Self-pay

## 2024-01-10 MED ORDER — TRAMADOL HCL 50 MG PO TABS
50.0000 mg | ORAL_TABLET | Freq: Three times a day (TID) | ORAL | 0 refills | Status: DC | PRN
  Filled 2024-01-10: qty 90, 30d supply, fill #0

## 2024-01-11 ENCOUNTER — Other Ambulatory Visit (HOSPITAL_COMMUNITY): Payer: Self-pay

## 2024-01-18 ENCOUNTER — Other Ambulatory Visit (HOSPITAL_COMMUNITY): Payer: Self-pay

## 2024-01-19 ENCOUNTER — Other Ambulatory Visit (HOSPITAL_COMMUNITY): Payer: Self-pay

## 2024-01-19 ENCOUNTER — Encounter (HOSPITAL_COMMUNITY): Payer: Self-pay

## 2024-01-19 MED ORDER — GABAPENTIN 300 MG PO CAPS
300.0000 mg | ORAL_CAPSULE | Freq: Two times a day (BID) | ORAL | 0 refills | Status: AC
  Filled 2024-01-19: qty 60, 30d supply, fill #0

## 2024-01-27 ENCOUNTER — Encounter (HOSPITAL_COMMUNITY): Payer: Self-pay

## 2024-01-27 ENCOUNTER — Encounter: Payer: Self-pay | Admitting: Pharmacist

## 2024-01-27 ENCOUNTER — Other Ambulatory Visit: Payer: Self-pay

## 2024-01-27 ENCOUNTER — Other Ambulatory Visit (HOSPITAL_COMMUNITY): Payer: Self-pay

## 2024-01-31 ENCOUNTER — Other Ambulatory Visit (HOSPITAL_COMMUNITY): Payer: Self-pay

## 2024-01-31 ENCOUNTER — Other Ambulatory Visit: Payer: Self-pay

## 2024-02-01 NOTE — Patient Instructions (Signed)
 SURGICAL WAITING ROOM VISITATION Patients having surgery or a procedure may have no more than 2 support people in the waiting area - these visitors may rotate in the visitor waiting room.   If the patient needs to stay at the hospital during part of their recovery, the visitor guidelines for inpatient rooms apply.  PRE-OP VISITATION  Pre-op nurse will coordinate an appropriate time for 1 support person to accompany the patient in pre-op.  This support person may not rotate.  This visitor will be contacted when the time is appropriate for the visitor to come back in the pre-op area.  Please refer to the [facility] website for the visitor guidelines for Inpatients (after your surgery is over and you are in a regular room).  Temporary Visitor Restrictions  Children ages 61 and under will not be able to visit patients in [facility] under most circumstances. Visitation is not restricted outside of hospitals unless noted otherwise in the [facility] and Location Specific Visitation Guidelines at :      [email or web address] Visitors with respiratory illnesses are discouraged from visiting and should remain at home.  You are not required to quarantine at this time prior to your surgery. However, you must do this: Hand Hygiene often Do NOT share personal items Notify your provider if you are in close contact with someone who has COVID or you develop fever 100.4 or greater, new onset of sneezing, cough, sore throat, shortness of breath or body aches.  If you test positive for Covid or have been in contact with anyone that has tested positive in the last 10 days please notify you surgeon.    Your procedure is scheduled on:  FRIDAY  02-11-2024  Report to [facility] Main Entrance: [address] entrance where the [employer] is available.   Report to admitting at: 06:00  AM  Call this number if you have any questions or problems the morning of surgery [phone]  Do not eat food  after Midnight the night prior to your surgery/procedure.  After Midnight you may have the following liquids until   05:30 AM DAY OF SURGERY  Clear Liquid Diet Water Black Coffee (sugar ok, NO MILK/CREAM OR CREAMERS)  Tea (sugar ok, NO MILK/CREAM OR CREAMERS) regular and decaf                             Plain Jell-O  with no fruit (NO RED)                                           Fruit ices (not with fruit pulp, NO RED)                                     Popsicles (NO RED)                                                                  Juice: NO CITRUS JUICES: only apple, WHITE grape, WHITE cranberry Sports drinks like Gatorade or Powerade (NO RED)  The day of surgery:  Drink ONE (1) Pre-Surgery G2 at  05:30   AM the morning of surgery. Drink in one sitting. Do not sip.  This drink was given to you during your hospital pre-op appointment visit. Nothing else to drink after completing the Pre-Surgery G2 : No candy, chewing gum or throat lozenges.    FOLLOW ANY ADDITIONAL PRE OP INSTRUCTIONS YOU RECEIVED FROM YOUR SURGEON'S OFFICE!!!   Oral Hygiene is also important to reduce your risk of infection.        Remember - BRUSH YOUR TEETH THE MORNING OF SURGERY WITH YOUR REGULAR TOOTHPASTE  Do NOT smoke after Midnight the night before surgery.  STOP TAKING all Vitamins, Herbs and supplements 1 week before your surgery.   ASPIRIN -  Stop taking 5-7 days before surgery.   Take ONLY these medicines the morning of surgery with A SIP OF WATER: Carvedilol , Levothyroxine , Gabapentin , Omeprazole  and Tramadol  if needed for pain.                   You may not have any metal on your body including hair pins, jewelry, and body piercing  Do not wear make-up, lotions, powders, perfumes or deodorant  Do not wear nail polish including gel and S&S, artificial / acrylic nails, or any other type of covering on natural nails including finger and toenails. If you have artificial nails,  gel coating, etc., that needs to be removed by a nail salon, Please have this removed prior to surgery. Not doing so may mean that your surgery could be cancelled or delayed if the Surgeon or anesthesia staff feels like they are unable to monitor you safely.   Do not shave 48 hours prior to surgery to avoid nicks in your skin which may contribute to postoperative infections.   Contacts, Hearing Aids, dentures or bridgework may not be worn into surgery. DENTURES WILL BE REMOVED PRIOR TO SURGERY PLEASE DO NOT APPLY Poly grip OR ADHESIVES!!!  You may bring a small overnight bag with you on the day of surgery, only pack items that are not valuable. [facility] IS NOT RESPONSIBLE   FOR VALUABLES THAT ARE LOST OR STOLEN.   Do not bring your home medications to the hospital. The Pharmacy will dispense medications listed on your medication list to you during your admission in the Hospital.  Please read over the following fact sheets you were given: IF YOU HAVE QUESTIONS ABOUT YOUR PRE-OP INSTRUCTIONS, PLEASE CALL [phone].      Pre-operative 4 CHG Bath Instructions   You can play a key role in reducing the risk of infection after surgery. Your skin needs to be as free of germs as possible. You can reduce the number of germs on your skin by washing with CHG (chlorhexidine  gluconate) soap before surgery. CHG is an antiseptic soap that kills germs and continues to kill germs even after washing.   DO NOT use if you have an allergy to chlorhexidine /CHG or antibacterial soaps. If your skin becomes reddened or irritated, stop using the CHG and notify one of our RNs at [phone]  Please shower with the CHG soap starting 4 days before surgery using the following schedule: Monday  02-07-2024     Do NOT use CHG soap  the morning of your                                                                                                                                  surgery.         Please keep in mind the following:  DO NOT shave, including legs and underarms, starting the day of your first shower.   You may shave your face at any point before/day of surgery.  Place clean sheets on your bed the day you start using CHG soap. Use a clean washcloth (not used since being washed) for each shower. DO NOT sleep with pets once you start using the CHG.  CHG Shower Instructions:  If you choose to wash your hair and private area, wash first with your normal shampoo/soap.  After you use shampoo/soap, rinse your hair and body thoroughly to remove shampoo/soap residue.  Turn the water OFF and apply about 3 tablespoons (45 ml) of CHG soap to a CLEAN washcloth.  Apply CHG soap ONLY FROM YOUR NECK DOWN TO YOUR TOES (washing for 3-5 minutes)  DO NOT use CHG soap on face, private areas, open wounds, or sores.  Pay special attention to the area where your surgery is being performed.  If you are having back surgery, having someone wash your back for you may be helpful. Wait 2 minutes after CHG soap is applied, then you may rinse off the CHG soap.  Pat dry with a clean towel  Put on clean clothes/pajamas   If you choose to wear lotion, please use ONLY the CHG-compatible lotions on the back of this paper.     Additional instructions for the day of surgery: DO NOT APPLY any CHG Soap,  lotions, deodorants, cologne, or perfumes on the day of surgery  Put on clean/comfortable clothes.  Brush your teeth.  Ask your nurse before applying any prescription medications to the skin.   CHG Compatible Lotions   Aveeno Moisturizing lotion  Cetaphil Moisturizing Cream  Cetaphil Moisturizing Lotion  Clairol Herbal Essence Moisturizing Lotion, Dry Skin  Clairol Herbal Essence Moisturizing Lotion, Extra Dry Skin  Clairol Herbal Essence Moisturizing Lotion, Normal Skin  Curel Age Defying Therapeutic  Moisturizing Lotion with Alpha Hydroxy  Curel Extreme Care Body Lotion  Curel Soothing Hands Moisturizing Hand Lotion  Curel Therapeutic Moisturizing Cream, Fragrance-Free  Curel Therapeutic Moisturizing Lotion, Fragrance-Free  Curel Therapeutic Moisturizing Lotion, Original Formula  Eucerin Daily Replenishing Lotion  Eucerin Dry Skin Therapy Plus Alpha Hydroxy Crme  Eucerin Dry Skin Therapy Plus Alpha Hydroxy Lotion  Eucerin Original Crme  Eucerin Original Lotion  Eucerin Plus Crme Eucerin Plus Lotion  Eucerin TriLipid Replenishing Lotion  Keri Anti-Bacterial Hand Lotion  Keri Deep Conditioning Original Lotion Dry Skin Formula Softly Scented  Keri Deep Conditioning Original Lotion, Fragrance Free Sensitive Skin Formula  Keri Lotion Fast Absorbing Fragrance Free Sensitive Skin Formula  Keri Lotion Fast Absorbing Softly Scented Dry Skin  Formula  Keri Original Lotion  Keri Skin Renewal Lotion Keri Silky Smooth Lotion  Keri Silky Smooth Sensitive Skin Lotion  Nivea Body Creamy Conditioning Oil  Nivea Body Extra Enriched Lotion  Nivea Body Original Lotion  Nivea Body Sheer Moisturizing Lotion Nivea Crme  Nivea Skin Firming Lotion  NutraDerm 30 Skin Lotion  NutraDerm Skin Lotion  NutraDerm Therapeutic Skin Cream  NutraDerm Therapeutic Skin Lotion  ProShield Protective Hand Cream  Provon moisturizing lotion   FAILURE TO FOLLOW THESE INSTRUCTIONS MAY RESULT IN THE CANCELLATION OF YOUR SURGERY  PATIENT SIGNATURE_________________________________  NURSE SIGNATURE__________________________________  ________________________________________________________________________        Nasario Exon    An incentive spirometer is a tool that can help keep your lungs clear and active. This tool measures how well you are filling your lungs with each breath. Taking long deep breaths may help reverse or decrease the chance of developing breathing (pulmonary) problems (especially  infection) following: A long period of time when you are unable to move or be active. BEFORE THE PROCEDURE  If the spirometer includes an indicator to show your best effort, your nurse or respiratory therapist will set it to a desired goal. If possible, sit up straight or lean slightly forward. Try not to slouch. Hold the incentive spirometer in an upright position. INSTRUCTIONS FOR USE  Sit on the edge of your bed if possible, or sit up as far as you can in bed or on a chair. Hold the incentive spirometer in an upright position. Breathe out normally. Place the mouthpiece in your mouth and seal your lips tightly around it. Breathe in slowly and as deeply as possible, raising the piston or the ball toward the top of the column. Hold your breath for 3-5 seconds or for as long as possible. Allow the piston or ball to fall to the bottom of the column. Remove the mouthpiece from your mouth and breathe out normally. Rest for a few seconds and repeat Steps 1 through 7 at least 10 times every 1-2 hours when you are awake. Take your time and take a few normal breaths between deep breaths. The spirometer may include an indicator to show your best effort. Use the indicator as a goal to work toward during each repetition. After each set of 10 deep breaths, practice coughing to be sure your lungs are clear. If you have an incision (the cut made at the time of surgery), support your incision when coughing by placing a pillow or rolled up towels firmly against it. Once you are able to get out of bed, walk around indoors and cough well. You may stop using the incentive spirometer when instructed by your caregiver.  RISKS AND COMPLICATIONS Take your time so you do not get dizzy or light-headed. If you are in pain, you may need to take or ask for pain medication before doing incentive spirometry. It is harder to take a deep breath if you are having pain. AFTER USE Rest and breathe slowly and easily. It can be  helpful to keep track of a log of your progress. Your caregiver can provide you with a simple table to help with this. If you are using the spirometer at home, follow these instructions: SEEK MEDICAL CARE IF:  You are having difficultly using the spirometer. You have trouble using the spirometer as often as instructed. Your pain medication is not giving enough relief while using the spirometer. You develop fever of 100.5 F (38.1 C) or higher.  SEEK IMMEDIATE MEDICAL CARE IF:  You cough up bloody sputum that had not been present before. You develop fever of 102 F (38.9 C) or greater. You develop worsening pain at or near the incision site. MAKE SURE YOU:  Understand these instructions. Will watch your condition. Will get help right away if you are not doing well or get worse. Document Released: 05/18/2006 Document Revised: 03/30/2011 Document Reviewed: 07/19/2006 [facility] Patient Information 2014 [place], [place].         If you would like to see a video about joint replacement:   [email or web address]

## 2024-02-01 NOTE — Progress Notes (Signed)
 COVID Vaccine received:  []  No [x]  Yes Date of any COVID positive Test in last 90 days:  PCP - Cheryle Frees, MD [phone]  Cardiologist -   Chest x-ray - 10-22-2023  1v EKG - 10-22-2023  Stress Test -  ECHO -  Cardiac Cath - 10-08-2004 normal CORS, aberrant origin of CX  CT Coronary Calcium  score:   Pacemaker / ICD device []  No []  Yes   Spinal Cord Stimulator:[]  No []  Yes       History of Sleep Apnea? []  No []  Yes   CPAP used?- []  No []  Yes    Medication on DOS: Amlodipine , Carvedilol , Levothyroxine ,  Omeprazole , Gabapentin , and Tramadol    Hold DOS: Losartan   (Takes Amlodipine -Valsartan  (Exforge ) q pm )  Patient has: []  NO Hx DM   [x]  Pre-DM   []  DM1  []   DM2 Does the patient monitor blood sugar?   []  N/A   [x]  No []  Yes  Last A1c was:  5.9   on  09-08-23    Does patient have a [employer] or Dexcom? []  No []  Yes   Fasting Blood Sugar Ranges-  Checks Blood Sugar _____ times a day  Blood Thinner / Instructions: Aspirin  Instructions:  ASA 81 mg  Activity level: Able to walk up 2 flights of stairs without becoming significantly short of breath or having chest pain?   []    Yes   []  No,  would have:  Patient can perform ADLs without assistance.  []   Yes    []  No  Comments:   Anesthesia review: HTN, GERD, hx CVA, CKD3 (referred to [facility]), Pre-DM no meds  Patient denies any S&S of respiratory illness or Covid - no shortness of breath, fever, cough or chest pain at PAT appointment.  Patient verbalized understanding and agreement to the Pre-Surgical Instructions that were given to them at this PAT appointment. Patient was also educated of the need to review these PAT instructions again prior to his/her surgery.I reviewed the appropriate phone numbers to call if they have any and questions or concerns.

## 2024-02-02 ENCOUNTER — Encounter (HOSPITAL_COMMUNITY): Payer: Self-pay

## 2024-02-02 ENCOUNTER — Other Ambulatory Visit: Payer: Self-pay

## 2024-02-02 ENCOUNTER — Encounter (HOSPITAL_COMMUNITY)
Admission: RE | Admit: 2024-02-02 | Discharge: 2024-02-02 | Disposition: A | Discharge status: Home / Self Care | Source: Ambulatory Visit | Attending: Orthopaedic Surgery | Admitting: Orthopaedic Surgery

## 2024-02-02 VITALS — BP 152/66 | HR 62 | Temp 98.9°F | Resp 20 | Ht 67.0 in | Wt 240.0 lb

## 2024-02-02 DIAGNOSIS — Z01818 Encounter for other preprocedural examination: Secondary | ICD-10-CM

## 2024-02-02 DIAGNOSIS — I129 Hypertensive chronic kidney disease with stage 1 through stage 4 chronic kidney disease, or unspecified chronic kidney disease: Secondary | ICD-10-CM | POA: Insufficient documentation

## 2024-02-02 DIAGNOSIS — E669 Obesity, unspecified: Secondary | ICD-10-CM | POA: Diagnosis not present

## 2024-02-02 DIAGNOSIS — I2510 Atherosclerotic heart disease of native coronary artery without angina pectoris: Secondary | ICD-10-CM | POA: Diagnosis not present

## 2024-02-02 DIAGNOSIS — N183 Chronic kidney disease, stage 3 unspecified: Secondary | ICD-10-CM | POA: Insufficient documentation

## 2024-02-02 DIAGNOSIS — Z01812 Encounter for preprocedural laboratory examination: Secondary | ICD-10-CM | POA: Insufficient documentation

## 2024-02-02 DIAGNOSIS — K219 Gastro-esophageal reflux disease without esophagitis: Secondary | ICD-10-CM | POA: Insufficient documentation

## 2024-02-02 DIAGNOSIS — Z8673 Personal history of transient ischemic attack (TIA), and cerebral infarction without residual deficits: Secondary | ICD-10-CM | POA: Diagnosis not present

## 2024-02-02 DIAGNOSIS — M1611 Unilateral primary osteoarthritis, right hip: Secondary | ICD-10-CM | POA: Diagnosis not present

## 2024-02-02 DIAGNOSIS — Z6837 Body mass index (BMI) 37.0-37.9, adult: Secondary | ICD-10-CM | POA: Insufficient documentation

## 2024-02-02 DIAGNOSIS — E039 Hypothyroidism, unspecified: Secondary | ICD-10-CM | POA: Insufficient documentation

## 2024-02-02 DIAGNOSIS — R7303 Prediabetes: Secondary | ICD-10-CM

## 2024-02-02 DIAGNOSIS — Z87891 Personal history of nicotine dependence: Secondary | ICD-10-CM | POA: Diagnosis not present

## 2024-02-02 HISTORY — DX: Chronic kidney disease, unspecified: N18.9

## 2024-02-02 LAB — CBC
HCT: 39.2 % (ref 36.0–46.0)
Hemoglobin: 13.4 g/dL (ref 12.0–15.0)
MCH: 32.5 pg (ref 26.0–34.0)
MCHC: 34.2 g/dL (ref 30.0–36.0)
MCV: 95.1 fL (ref 80.0–100.0)
Platelets: 558 K/uL — ABNORMAL HIGH (ref 150–400)
RBC: 4.12 MIL/uL (ref 3.87–5.11)
RDW: 12.4 % (ref 11.5–15.5)
WBC: 8.3 K/uL (ref 4.0–10.5)
nRBC: 0.6 % — ABNORMAL HIGH (ref 0.0–0.2)

## 2024-02-02 LAB — BASIC METABOLIC PANEL WITH GFR
Anion gap: 10 (ref 5–15)
BUN: 16 mg/dL (ref 8–23)
CO2: 26 mmol/L (ref 22–32)
Calcium: 9.9 mg/dL (ref 8.9–10.3)
Chloride: 103 mmol/L (ref 98–111)
Creatinine, Ser: 0.9 mg/dL (ref 0.44–1.00)
GFR, Estimated: >60 mL/min
Glucose, Bld: 92 mg/dL (ref 70–99)
Potassium: 4.6 mmol/L (ref 3.5–5.1)
Sodium: 139 mmol/L (ref 135–145)

## 2024-02-02 LAB — NO BLOOD PRODUCTS

## 2024-02-02 LAB — SURGICAL PCR SCREEN
MRSA, PCR: NEGATIVE
Staphylococcus aureus: NEGATIVE

## 2024-02-04 ENCOUNTER — Encounter (HOSPITAL_COMMUNITY): Payer: Self-pay

## 2024-02-04 NOTE — Anesthesia Preprocedure Evaluation (Addendum)
"                                    Anesthesia Evaluation  Patient identified by MRN, date of birth, ID band Patient awake    Reviewed: Allergy & Precautions, NPO status , Patient's Chart, lab work & pertinent test results  Airway Mallampati: II  TM Distance: >3 FB Neck ROM: Full    Dental  (+) Chipped,    Pulmonary former smoker   Pulmonary exam normal        Cardiovascular hypertension, Pt. on medications and Pt. on home beta blockers Normal cardiovascular exam     Neuro/Psych Lacunar infarction  negative psych ROS   GI/Hepatic Neg liver ROS,GERD  Medicated and Controlled,,  Endo/Other  Hypothyroidism    Renal/GU      Musculoskeletal  (+) Arthritis ,    Abdominal   Peds  Hematology  (+) REFUSES BLOOD PRODUCTS  Anesthesia Other Findings osteoarthritis right hip  Reproductive/Obstetrics                              Anesthesia Physical Anesthesia Plan  ASA: 3  Anesthesia Plan: Spinal   Post-op Pain Management:    Induction:   PONV Risk Score and Plan: 2 and Ondansetron , Dexamethasone , Propofol  infusion, Midazolam  and Treatment may vary due to age or medical condition  Airway Management Planned: Simple Face Mask  Additional Equipment:   Intra-op Plan:   Post-operative Plan:   Informed Consent: I have reviewed the patients History and Physical, chart, labs and discussed the procedure including the risks, benefits and alternatives for the proposed anesthesia with the patient or authorized representative who has indicated his/her understanding and acceptance.     Dental advisory given  Plan Discussed with: CRNA  Anesthesia Plan Comments: (PAT note from 1/14)         Anesthesia Quick Evaluation  "

## 2024-02-04 NOTE — Progress Notes (Signed)
 " Case: [id number] Date/Time: 02/11/24 0815   Procedure: ARTHROPLASTY, HIP, TOTAL, ANTERIOR APPROACH (Right: Hip)   Anesthesia type: Choice   Diagnosis: Primary osteoarthritis of right hip [M16.11]   Pre-op diagnosis: osteoarthritis right hip   Location: [facility] ROOM 10 / [facility]   Surgeons: Vernetta Lonni GRADE, MD       DISCUSSION: Theresa Nolan is a 69 year old female with past medical history of former smoking, HTN, coronary calcifications, history of CVA (chronic lacunar infarct just inferior to the posterior limb of the right internal capsule 06/14/09 CT), GERD, CKD 3, hypothyroidism, arthritis, obesity (BMI 37).  Patient has signed refusal of blood products form. Ok with albumin   Patient remotely seen by cardiology.  She has history of a false positive stress test and underwent cardiac cath in 2006 which showed normal coronary arteries.  Hx of CKD3 and referred to Nephrology. Kidney function normal on pre op labs.  VS: BP (!) 152/66 Comment: right arm sitting  Pulse 62   Temp 37.2 C (Oral)   Resp 20   Ht 5' 7 (1.702 m)   Wt 108.9 kg   SpO2 98%   BMI 37.59 kg/m   PROVIDERS: Leonel Cole, MD Nephrology- Almarie Bonine, MD at [facility]  LABS: Labs reviewed: Acceptable for surgery. (all labs ordered are listed, but only abnormal results are displayed)  Labs Reviewed  CBC - Abnormal; Notable for the following components:      Result Value   Platelets 558 (*)    nRBC 0.6 (*)    All other components within normal limits  SURGICAL PCR SCREEN  BASIC METABOLIC PANEL WITH GFR  NO BLOOD PRODUCTS     CTA chest/abdomen/pelvis 10/22/2023:  IMPRESSION: 1. No evidence of thoracic or abdominal aortic dissection or aneurysm. 2. 4 mm nodule is noted in left upper lobe. If patient is low risk for malignancy, no routine follow-up imaging is recommended. If patient is high risk for malignancy, a non-contrast chest CT at 12 months is optional.This recommendation follows the  consensus statement: Guidelines for Management of Incidental Pulmonary Nodules Detected on CT Images: From the Fleischner Society 2017; Radiology 2017; 284:228-243. 3. Mild coronary artery calcifications are noted. 4. Bilateral renal cortical scarring is noted. 5. Aortic atherosclerosis.   Echo 11/04/2001:   SUMMARY  -  Left ventricular ejection fraction was estimated , range being 65        % to 70 %. There were no left ventricular regional wall        motion abnormalities.  -  Left atrial size was at the upper limits of normal.  -  There is excellent LV function. There is SAM some of the mitral        valve cords, but NOT of the mitral leaflets. There is no LVOT        gradient and no mid-cavitary gradient.   IMPRESSIONS  -  There is excellent LV function. There is SAM some of the mitral        valve cords, but NOT of the mitral leaflets. There is no LVOT        gradient and no mid-cavitary gradient.  Past Medical History:  Diagnosis Date   Chronic back pain    Chronic kidney disease    Degenerative joint disease    GERD (gastroesophageal reflux disease)    Hypercholesteremia    Hypertension    Hypothyroidism    Lacunar infarction (HCC)    Stroke (HCC)    Lacunar - no residule  TSH elevation     Past Surgical History:  Procedure Laterality Date   ABDOMINAL HYSTERECTOMY     ovaries remain    APPENDECTOMY  1989-1990   CARDIAC CATHETERIZATION     10/08/04: 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 NL LVF. EF 60%.   CHOLECYSTECTOMY N/A 05/24/2014   Procedure: LAPAROSCOPIC CHOLECYSTECTOMY WITH INTRAOPERATIVE CHOLANGIOGRAM;  Surgeon: Vicenta Poli, MD;  Location: [facility];  Service: General;  Laterality: N/A;   EYE SURGERY Bilateral 07/15/2022   cataract extractions   HERNIA REPAIR     umbilical hernia repair as a child   TONSILLECTOMY     TOTAL HIP ARTHROPLASTY Left 12/28/2017   Procedure: LEFT TOTAL HIP ARTHROPLASTY ANTERIOR APPROACH;  Surgeon: Poli Lonni GRADE, MD;  Location: [facility] OR;  Service: Orthopedics;  Laterality: Left;   TUBAL LIGATION      MEDICATIONS:  amLODipine  (NORVASC ) 10 MG tablet   amLODipine -valsartan  (EXFORGE ) 10-160 MG per tablet   aspirin  81 MG chewable tablet   carvedilol  (COREG ) 12.5 MG tablet   carvedilol  (COREG ) 12.5 MG tablet   Cholecalciferol  (VITAMIN D ) 50 MCG (2000 UT) tablet   co-enzyme Q-10 30 MG capsule   ezetimibe  (ZETIA ) 10 MG tablet   ezetimibe  (ZETIA ) 10 MG tablet   ezetimibe  (ZETIA ) 10 MG tablet   gabapentin  (NEURONTIN ) 300 MG capsule   Glucosamine-Chondroitin (OSTEO BI-FLEX REGULAR STRENGTH PO)   levothyroxine  (SYNTHROID ) 50 MCG tablet   losartan  (COZAAR ) 100 MG tablet   losartan  (COZAAR ) 100 MG tablet   losartan  (COZAAR ) 100 MG tablet   Menthol -Methyl Salicylate (MUSCLE RUB) 10-15 % CREA   methocarbamol  (ROBAXIN ) 750 MG tablet   Omega-3 Fatty Acids (FISH OIL) 1000 MG CAPS   omeprazole  (PRILOSEC) 20 MG capsule   omeprazole  (PRILOSEC) 20 MG capsule   simvastatin  (ZOCOR ) 40 MG tablet   simvastatin  (ZOCOR ) 40 MG tablet   simvastatin  (ZOCOR ) 40 MG tablet   traMADol  (ULTRAM ) 50 MG tablet   traMADol  (ULTRAM ) 50 MG tablet   traMADol  (ULTRAM ) 50 MG tablet   TURMERIC PO   No current facility-administered medications for this encounter.    Theresa Nolan [facility] Short Stay/Anesthesiology [facility] Phone [phone] 02/04/2024 2:01 PM       "

## 2024-02-11 DIAGNOSIS — M1611 Unilateral primary osteoarthritis, right hip: Secondary | ICD-10-CM

## 2024-02-11 NOTE — Progress Notes (Addendum)
 Patient made aware of facility change with date and arrival time instructions reviewed with patient voiced understanding and denied any question. Per patient last dose of Asa was 02-07-2024

## 2024-02-14 DIAGNOSIS — M1611 Unilateral primary osteoarthritis, right hip: Principal | ICD-10-CM | POA: Insufficient documentation

## 2024-02-14 NOTE — H&P (Signed)
 TOTAL HIP ADMISSION H&P  Patient is admitted for right total hip arthroplasty.  Subjective:  Chief Complaint: right hip pain  HPI: Theresa Nolan, 69 y.o. female, has a history of pain and functional disability in the right hip(s) due to arthritis and patient has failed non-surgical conservative treatments for greater than 12 weeks to include NSAID's and/or analgesics, corticosteriod injections, flexibility and strengthening excercises, use of assistive devices, weight reduction as appropriate, and activity modification.  Onset of symptoms was gradual starting a few years ago with gradually worsening course since that time.The patient noted no past surgery on the right hip(s).  Patient currently rates pain in the right hip at 10 out of 10 with activity. Patient has night pain, worsening of pain with activity and weight bearing, trendelenberg gait, pain that interfers with activities of daily living, and pain with passive range of motion. Patient has evidence of subchondral sclerosis, periarticular osteophytes, and joint space narrowing by imaging studies. This condition presents safety issues increasing the risk of falls.  There is no current active infection.  Patient Active Problem List   Diagnosis Date Noted   Unilateral primary osteoarthritis, right hip 02/14/2024   Status post total replacement of left hip 12/28/2017   Cholecystitis 05/23/2014   Acute cholecystitis    HYPERLIPIDEMIA-MIXED 07/17/2009   HYPERTENSION, BENIGN 07/17/2009   SYNCOPE AND COLLAPSE 07/17/2009   EDEMA 07/17/2009   Past Medical History:  Diagnosis Date   Chronic back pain    Chronic kidney disease    Degenerative joint disease    GERD (gastroesophageal reflux disease)    Hypercholesteremia    Hypertension    Hypothyroidism    Lacunar infarction (HCC)    Stroke (HCC)    Lacunar - no residule   TSH elevation     Past Surgical History:  Procedure Laterality Date   ABDOMINAL HYSTERECTOMY     ovaries  remain    APPENDECTOMY  1989-1990   CARDIAC CATHETERIZATION     10/08/04: 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 NL LVF. EF 60%.   CHOLECYSTECTOMY N/A 05/24/2014   Procedure: LAPAROSCOPIC CHOLECYSTECTOMY WITH INTRAOPERATIVE CHOLANGIOGRAM;  Surgeon: Vicenta Poli, MD;  Location: [facility] OR;  Service: General;  Laterality: N/A;   EYE SURGERY Bilateral 07/15/2022   cataract extractions   HERNIA REPAIR     umbilical hernia repair as a child   TONSILLECTOMY     TOTAL HIP ARTHROPLASTY Left 12/28/2017   Procedure: LEFT TOTAL HIP ARTHROPLASTY ANTERIOR APPROACH;  Surgeon: Poli Lonni GRADE, MD;  Location: [facility] OR;  Service: Orthopedics;  Laterality: Left;   TUBAL LIGATION      No current facility-administered medications for this encounter.   Current Outpatient Medications  Medication Sig Dispense Refill Last Dose/Taking   amLODipine  (NORVASC ) 10 MG tablet Take 1 tablet (10 mg total) by mouth daily. 90 tablet 1 Taking   aspirin  81 MG chewable tablet Chew 1 tablet (81 mg total) by mouth 2 (two) times daily. (Patient taking differently: Chew 81 mg by mouth daily.) 35 tablet 0 Taking Differently   carvedilol  (COREG ) 12.5 MG tablet Take 1 tablet (12.5 mg total) by mouth 2 (two) times daily with food. 180 tablet 1 Taking   Cholecalciferol  (VITAMIN D ) 50 MCG (2000 UT) tablet Take 2,000 Units by mouth daily.   Taking   co-enzyme Q-10 30 MG capsule Take 30 mg by mouth every evening.   Taking   ezetimibe  (ZETIA ) 10 MG tablet Take 1 tablet (10 mg total) by mouth daily. 90  tablet 2 Taking   gabapentin  (NEURONTIN ) 300 MG capsule Take 1 capsule (300 mg total) by mouth 2 (two) times daily. 60 capsule 0 Taking   Glucosamine-Chondroitin (OSTEO BI-FLEX REGULAR STRENGTH PO) Take 2 tablets by mouth daily.   Taking   levothyroxine  (SYNTHROID ) 50 MCG tablet Take 1 tablet (50 mcg total) by mouth in the morning on an empty stomach. 90 tablet 3 Taking   losartan  (COZAAR ) 100 MG tablet Take 1 tablet (100 mg total)  by mouth daily. 90 tablet 1 Taking   Menthol -Methyl Salicylate (MUSCLE RUB) 10-15 % CREA Apply 1 application topically as needed for muscle pain.   Taking As Needed   methocarbamol  (ROBAXIN ) 750 MG tablet Take 1 tablet (750 mg total) by mouth every 8 (eight) hours as needed for muscle spasms. 60 tablet 1 Taking As Needed   Omega-3 Fatty Acids (FISH OIL) 1000 MG CAPS Take 2,000 mg by mouth daily.   Taking   omeprazole  (PRILOSEC) 20 MG capsule Take 1 capsule (20 mg total) by mouth 2 (two) times daily. 180 capsule 1 Taking   simvastatin  (ZOCOR ) 40 MG tablet Take 1 tablet (40 mg total) by mouth every evening. 90 tablet 1 Taking   traMADol  (ULTRAM ) 50 MG tablet Take 1 tablet (50 mg total) by mouth 3 (three) times daily as needed. 90 tablet 0 Taking As Needed   TURMERIC PO Take 2 tablets by mouth every evening.   Taking   amLODipine -valsartan  (EXFORGE ) 10-160 MG per tablet Take 1 tablet by mouth every evening.       carvedilol  (COREG ) 12.5 MG tablet Take 1 tablet (12.5 mg total) by mouth 2 (two) times daily with food (Patient not taking: Reported on 01/31/2024) 180 tablet 3 Not Taking   ezetimibe  (ZETIA ) 10 MG tablet TAKE 1 TABLET BY MOUTH ONCE A DAY (Patient not taking: Reported on 01/31/2024) 90 tablet 1 Not Taking   ezetimibe  (ZETIA ) 10 MG tablet Take 1 tablet (10 mg total) by mouth daily. (Patient not taking: Reported on 01/31/2024) 90 tablet 1 Not Taking   losartan  (COZAAR ) 100 MG tablet TAKE 1 TABLET BY MOUTH ONCE DAILY. (Patient not taking: Reported on 01/31/2024) 90 tablet 1 Not Taking   losartan  (COZAAR ) 100 MG tablet Take 1 tablet (100 mg total) by mouth daily. (Patient not taking: Reported on 01/31/2024) 90 tablet 3 Not Taking   omeprazole  (PRILOSEC) 20 MG capsule TAKE 1 CAPSULE BY MOUTH TWICE DAILY. (Patient not taking: Reported on 01/31/2024) 180 capsule 2 Not Taking   simvastatin  (ZOCOR ) 40 MG tablet Take 1 tablet (40 mg total) by mouth every evening. (Patient not taking: Reported on 01/31/2024) 90  tablet 3 Not Taking   simvastatin  (ZOCOR ) 40 MG tablet Take 1 tablet (40 mg total) by mouth every evening. (Patient not taking: Reported on 01/31/2024) 90 tablet 3 Not Taking   traMADol  (ULTRAM ) 50 MG tablet Take 1 tablet (50 mg total) by mouth 3 (three) times daily as needed. (Patient not taking: Reported on 01/31/2024) 90 tablet 0 Not Taking   traMADol  (ULTRAM ) 50 MG tablet Take 1 tablet (50 mg total) by mouth 3 (three) times daily as needed. (Patient not taking: Reported on 01/31/2024) 90 tablet 0 Not Taking   Allergies[1]  Social History   Tobacco Use   Smoking status: Former   Smokeless tobacco: Never   Tobacco comments:    quit over 30 years ago  Substance Use Topics   Alcohol use: Yes    Alcohol/week: 1.0 standard drink of alcohol  Types: 1 Glasses of wine per week    Comment: occasionally     No family history on file.   Review of Systems  Objective:  Physical Exam Vitals reviewed.  Constitutional:      Appearance: Normal appearance.  HENT:     Head: Normocephalic and atraumatic.  Eyes:     Extraocular Movements: Extraocular movements intact.     Pupils: Pupils are equal, round, and reactive to light.  Cardiovascular:     Rate and Rhythm: Normal rate and regular rhythm.  Pulmonary:     Effort: Pulmonary effort is normal.     Breath sounds: Normal breath sounds.  Abdominal:     Palpations: Abdomen is soft.  Musculoskeletal:     Cervical back: Normal range of motion and neck supple.     Right hip: Tenderness and bony tenderness present. Decreased range of motion. Decreased strength.  Neurological:     Mental Status: She is alert and oriented to person, place, and time.  Psychiatric:        Behavior: Behavior normal.     Vital signs in last 24 hours:    Labs:   Estimated body mass index is 37.59 kg/m as calculated from the following:   Height as of 02/02/24: 5' 7 (1.702 m).   Weight as of 02/02/24: 108.9 kg.   Imaging Review Plain radiographs  demonstrate severe degenerative joint disease of the right hip(s). The bone quality appears to be good for age and reported activity level.      Assessment/Plan:  End stage arthritis, right hip(s)  The patient history, physical examination, clinical judgement of the provider and imaging studies are consistent with end stage degenerative joint disease of the right hip(s) and total hip arthroplasty is deemed medically necessary. The treatment options including medical management, injection therapy, arthroscopy and arthroplasty were discussed at length. The risks and benefits of total hip arthroplasty were presented and reviewed. The risks due to aseptic loosening, infection, stiffness, dislocation/subluxation,  thromboembolic complications and other imponderables were discussed.  The patient acknowledged the explanation, agreed to proceed with the plan and consent was signed. Patient is being admitted for inpatient treatment for surgery, pain control, PT, OT, prophylactic antibiotics, VTE prophylaxis, progressive ambulation and ADL's and discharge planning.The patient is planning to be discharged home with home health services       [1]  Allergies Allergen Reactions   Lisinopril Cough

## 2024-02-15 ENCOUNTER — Ambulatory Visit (HOSPITAL_COMMUNITY): Admitting: Anesthesiology

## 2024-02-15 ENCOUNTER — Observation Stay (HOSPITAL_COMMUNITY)
Admission: RE | Admit: 2024-02-15 | Discharge: 2024-02-17 | Disposition: A | Discharge status: Home Health | Attending: Orthopaedic Surgery | Admitting: Orthopaedic Surgery

## 2024-02-15 ENCOUNTER — Ambulatory Visit (HOSPITAL_COMMUNITY): Payer: Self-pay | Admitting: Medical

## 2024-02-15 ENCOUNTER — Ambulatory Visit (HOSPITAL_COMMUNITY)

## 2024-02-15 ENCOUNTER — Other Ambulatory Visit: Payer: Self-pay

## 2024-02-15 ENCOUNTER — Encounter (HOSPITAL_COMMUNITY): Admission: RE | Payer: Self-pay | Source: Home / Self Care

## 2024-02-15 ENCOUNTER — Encounter (HOSPITAL_COMMUNITY): Payer: Self-pay | Admitting: Orthopaedic Surgery

## 2024-02-15 DIAGNOSIS — Z79899 Other long term (current) drug therapy: Secondary | ICD-10-CM | POA: Insufficient documentation

## 2024-02-15 DIAGNOSIS — E039 Hypothyroidism, unspecified: Secondary | ICD-10-CM | POA: Diagnosis not present

## 2024-02-15 DIAGNOSIS — I10 Essential (primary) hypertension: Secondary | ICD-10-CM | POA: Diagnosis not present

## 2024-02-15 DIAGNOSIS — Z96642 Presence of left artificial hip joint: Secondary | ICD-10-CM | POA: Diagnosis not present

## 2024-02-15 DIAGNOSIS — I129 Hypertensive chronic kidney disease with stage 1 through stage 4 chronic kidney disease, or unspecified chronic kidney disease: Secondary | ICD-10-CM | POA: Diagnosis not present

## 2024-02-15 DIAGNOSIS — Z8673 Personal history of transient ischemic attack (TIA), and cerebral infarction without residual deficits: Secondary | ICD-10-CM | POA: Insufficient documentation

## 2024-02-15 DIAGNOSIS — N189 Chronic kidney disease, unspecified: Secondary | ICD-10-CM | POA: Insufficient documentation

## 2024-02-15 DIAGNOSIS — Z96641 Presence of right artificial hip joint: Secondary | ICD-10-CM

## 2024-02-15 DIAGNOSIS — M1611 Unilateral primary osteoarthritis, right hip: Principal | ICD-10-CM | POA: Insufficient documentation

## 2024-02-15 DIAGNOSIS — Z87891 Personal history of nicotine dependence: Secondary | ICD-10-CM

## 2024-02-15 DIAGNOSIS — M25551 Pain in right hip: Secondary | ICD-10-CM | POA: Diagnosis present

## 2024-02-15 LAB — NO BLOOD PRODUCTS

## 2024-02-15 MED ORDER — METHOCARBAMOL 1000 MG/10ML IJ SOLN: 500.0000 mg | Freq: Four times a day (QID) | INTRAMUSCULAR | Status: DC | PRN

## 2024-02-15 MED ORDER — MENTHOL 3 MG MT LOZG: 1.0000 | LOZENGE | OROMUCOSAL | Status: DC | PRN

## 2024-02-15 MED ORDER — LACTATED RINGERS IV SOLN: INTRAVENOUS | Status: DC

## 2024-02-15 MED ORDER — FENTANYL CITRATE (PF) 100 MCG/2ML IJ SOLN
INTRAMUSCULAR | Status: AC
  Filled 2024-02-15: qty 2

## 2024-02-15 MED ORDER — CHLORHEXIDINE GLUCONATE 0.12 % MT SOLN: 15.0000 mL | Freq: Once | OROMUCOSAL | Status: AC

## 2024-02-15 MED ORDER — STERILE WATER FOR IRRIGATION IR SOLN
Status: DC | PRN
  Administered 2024-02-15: 1000 mL

## 2024-02-15 MED ORDER — ALUM & MAG HYDROXIDE-SIMETH 200-200-20 MG/5ML PO SUSP: 30.0000 mL | ORAL | Status: DC | PRN

## 2024-02-15 MED ORDER — ASPIRIN 81 MG PO CHEW
81.0000 mg | CHEWABLE_TABLET | Freq: Two times a day (BID) | ORAL | Status: DC
  Administered 2024-02-15 – 2024-02-17 (×4): 81 mg via ORAL
  Filled 2024-02-15 (×4): qty 1

## 2024-02-15 MED ORDER — PROPOFOL 10 MG/ML IV BOLUS
INTRAVENOUS | Status: AC
  Filled 2024-02-15: qty 20

## 2024-02-15 MED ORDER — CEFAZOLIN SODIUM-DEXTROSE 2-4 GM/100ML-% IV SOLN
2.0000 g | INTRAVENOUS | Status: AC
  Administered 2024-02-15: 2 g via INTRAVENOUS
  Filled 2024-02-15: qty 100

## 2024-02-15 MED ORDER — PHENOL 1.4 % MT LIQD: 1.0000 | OROMUCOSAL | Status: DC | PRN

## 2024-02-15 MED ORDER — ONDANSETRON HCL 4 MG/2ML IJ SOLN: 4.0000 mg | Freq: Once | INTRAMUSCULAR | Status: DC | PRN

## 2024-02-15 MED ORDER — PANTOPRAZOLE SODIUM 40 MG PO TBEC
40.0000 mg | DELAYED_RELEASE_TABLET | Freq: Every day | ORAL | Status: DC
  Administered 2024-02-15 – 2024-02-17 (×3): 40 mg via ORAL
  Filled 2024-02-15 (×3): qty 1

## 2024-02-15 MED ORDER — PROPOFOL 500 MG/50ML IV EMUL
INTRAVENOUS | Status: DC | PRN
  Administered 2024-02-15: 100 ug/kg/min via INTRAVENOUS

## 2024-02-15 MED ORDER — PHENYLEPHRINE HCL-NACL 20-0.9 MG/250ML-% IV SOLN
INTRAVENOUS | Status: DC | PRN
  Administered 2024-02-15: 40 ug/min via INTRAVENOUS

## 2024-02-15 MED ORDER — DOCUSATE SODIUM 100 MG PO CAPS
100.0000 mg | ORAL_CAPSULE | Freq: Two times a day (BID) | ORAL | Status: DC
  Administered 2024-02-15 – 2024-02-17 (×4): 100 mg via ORAL
  Filled 2024-02-15 (×4): qty 1

## 2024-02-15 MED ORDER — POLYETHYLENE GLYCOL 3350 17 G PO PACK: 17.0000 g | PACK | Freq: Every day | ORAL | Status: DC | PRN

## 2024-02-15 MED ORDER — LEVOTHYROXINE SODIUM 25 MCG PO TABS
50.0000 ug | ORAL_TABLET | Freq: Every morning | ORAL | Status: DC
  Administered 2024-02-16 – 2024-02-17 (×2): 50 ug via ORAL
  Filled 2024-02-15 (×2): qty 2

## 2024-02-15 MED ORDER — CHLORHEXIDINE GLUCONATE 0.12 % MT SOLN
OROMUCOSAL | Status: AC
  Administered 2024-02-15: 15 mL via OROMUCOSAL
  Filled 2024-02-15: qty 15

## 2024-02-15 MED ORDER — COENZYME Q10 30 MG PO CAPS: 30.0000 mg | ORAL_CAPSULE | Freq: Every evening | ORAL | Status: DC

## 2024-02-15 MED ORDER — GABAPENTIN 300 MG PO CAPS
300.0000 mg | ORAL_CAPSULE | Freq: Two times a day (BID) | ORAL | Status: DC
  Administered 2024-02-15 – 2024-02-17 (×4): 300 mg via ORAL
  Filled 2024-02-15 (×4): qty 1

## 2024-02-15 MED ORDER — ACETAMINOPHEN 10 MG/ML IV SOLN
1000.0000 mg | Freq: Once | INTRAVENOUS | Status: DC | PRN
  Administered 2024-02-15: 1000 mg via INTRAVENOUS

## 2024-02-15 MED ORDER — EZETIMIBE 10 MG PO TABS
10.0000 mg | ORAL_TABLET | Freq: Every day | ORAL | Status: DC
  Administered 2024-02-15 – 2024-02-16 (×2): 10 mg via ORAL
  Filled 2024-02-15 (×2): qty 1

## 2024-02-15 MED ORDER — ONDANSETRON HCL 4 MG/2ML IJ SOLN
4.0000 mg | Freq: Four times a day (QID) | INTRAMUSCULAR | Status: DC | PRN
  Administered 2024-02-15: 4 mg via INTRAVENOUS
  Filled 2024-02-15: qty 2

## 2024-02-15 MED ORDER — HYDROMORPHONE HCL 1 MG/ML IJ SOLN
0.5000 mg | INTRAMUSCULAR | Status: DC | PRN
  Administered 2024-02-15 – 2024-02-16 (×3): 1 mg via INTRAVENOUS
  Filled 2024-02-15 (×3): qty 1

## 2024-02-15 MED ORDER — 0.9 % SODIUM CHLORIDE (POUR BTL) OPTIME
TOPICAL | Status: DC | PRN
  Administered 2024-02-15: 1000 mL

## 2024-02-15 MED ORDER — FENTANYL CITRATE (PF) 100 MCG/2ML IJ SOLN
25.0000 ug | INTRAMUSCULAR | Status: DC | PRN
  Administered 2024-02-15 (×3): 50 ug via INTRAVENOUS

## 2024-02-15 MED ORDER — ACETAMINOPHEN 10 MG/ML IV SOLN
INTRAVENOUS | Status: AC
  Filled 2024-02-15: qty 100

## 2024-02-15 MED ORDER — AMLODIPINE BESYLATE 10 MG PO TABS
10.0000 mg | ORAL_TABLET | Freq: Every day | ORAL | Status: DC
  Administered 2024-02-15 – 2024-02-16 (×2): 10 mg via ORAL
  Filled 2024-02-15 (×2): qty 1

## 2024-02-15 MED ORDER — SORBITOL 70 % SOLN: 30.0000 mL | Freq: Every day | Status: DC | PRN

## 2024-02-15 MED ORDER — OXYCODONE HCL 5 MG PO TABS
5.0000 mg | ORAL_TABLET | ORAL | Status: DC | PRN
  Filled 2024-02-15 (×3): qty 2

## 2024-02-15 MED ORDER — LOSARTAN POTASSIUM 50 MG PO TABS
100.0000 mg | ORAL_TABLET | Freq: Every day | ORAL | Status: DC
  Administered 2024-02-15 – 2024-02-16 (×2): 100 mg via ORAL
  Filled 2024-02-15 (×2): qty 2

## 2024-02-15 MED ORDER — OXYCODONE HCL 5 MG PO TABS
10.0000 mg | ORAL_TABLET | ORAL | Status: DC | PRN
  Administered 2024-02-15 – 2024-02-17 (×10): 10 mg via ORAL
  Filled 2024-02-15 (×5): qty 2
  Filled 2024-02-15: qty 3
  Filled 2024-02-15 (×2): qty 2

## 2024-02-15 MED ORDER — TRANEXAMIC ACID-NACL 1000-0.7 MG/100ML-% IV SOLN
1000.0000 mg | INTRAVENOUS | Status: AC
  Administered 2024-02-15: 1000 mg via INTRAVENOUS
  Filled 2024-02-15: qty 100

## 2024-02-15 MED ORDER — VITAMIN D 25 MCG (1000 UNIT) PO TABS
2000.0000 [IU] | ORAL_TABLET | Freq: Every day | ORAL | Status: DC
  Administered 2024-02-15 – 2024-02-17 (×3): 2000 [IU] via ORAL
  Filled 2024-02-15 (×3): qty 2

## 2024-02-15 MED ORDER — EPHEDRINE SULFATE-NACL 50-0.9 MG/10ML-% IV SOSY
PREFILLED_SYRINGE | INTRAVENOUS | Status: DC | PRN
  Administered 2024-02-15: 5 mg via INTRAVENOUS

## 2024-02-15 MED ORDER — CEFAZOLIN SODIUM-DEXTROSE 2-4 GM/100ML-% IV SOLN
2.0000 g | Freq: Four times a day (QID) | INTRAVENOUS | Status: AC
  Administered 2024-02-15 – 2024-02-16 (×2): 2 g via INTRAVENOUS
  Filled 2024-02-15 (×2): qty 100

## 2024-02-15 MED ORDER — BUPIVACAINE IN DEXTROSE 0.75-8.25 % IT SOLN
INTRATHECAL | Status: DC | PRN
  Administered 2024-02-15: 1.6 mL via INTRATHECAL

## 2024-02-15 MED ORDER — DIPHENHYDRAMINE HCL 12.5 MG/5ML PO ELIX: 12.5000 mg | ORAL_SOLUTION | ORAL | Status: DC | PRN

## 2024-02-15 MED ORDER — ACETAMINOPHEN 325 MG PO TABS: 325.0000 mg | ORAL_TABLET | Freq: Four times a day (QID) | ORAL | Status: DC | PRN

## 2024-02-15 MED ORDER — METOCLOPRAMIDE HCL 5 MG/ML IJ SOLN: 5.0000 mg | Freq: Three times a day (TID) | INTRAMUSCULAR | Status: DC | PRN

## 2024-02-15 MED ORDER — SODIUM CHLORIDE 0.9 % IV SOLN: INTRAVENOUS | Status: DC

## 2024-02-15 MED ORDER — CARVEDILOL 12.5 MG PO TABS
12.5000 mg | ORAL_TABLET | Freq: Two times a day (BID) | ORAL | Status: DC
  Administered 2024-02-15 – 2024-02-17 (×4): 12.5 mg via ORAL
  Filled 2024-02-15 (×4): qty 1

## 2024-02-15 MED ORDER — MIDAZOLAM HCL (PF) 2 MG/2ML IJ SOLN
INTRAMUSCULAR | Status: DC | PRN
  Administered 2024-02-15: 2 mg via INTRAVENOUS

## 2024-02-15 MED ORDER — ONDANSETRON HCL 4 MG PO TABS
4.0000 mg | ORAL_TABLET | Freq: Four times a day (QID) | ORAL | Status: DC | PRN
  Administered 2024-02-16: 4 mg via ORAL
  Filled 2024-02-15: qty 1

## 2024-02-15 MED ORDER — METOCLOPRAMIDE HCL 5 MG PO TABS: 5.0000 mg | ORAL_TABLET | Freq: Three times a day (TID) | ORAL | Status: DC | PRN

## 2024-02-15 MED ORDER — SIMVASTATIN 20 MG PO TABS
40.0000 mg | ORAL_TABLET | Freq: Every evening | ORAL | Status: DC
  Administered 2024-02-15 – 2024-02-16 (×2): 40 mg via ORAL
  Filled 2024-02-15 (×2): qty 2

## 2024-02-15 MED ORDER — METHOCARBAMOL 500 MG PO TABS
500.0000 mg | ORAL_TABLET | Freq: Four times a day (QID) | ORAL | Status: DC | PRN
  Administered 2024-02-15 – 2024-02-16 (×3): 500 mg via ORAL
  Filled 2024-02-15 (×3): qty 1

## 2024-02-15 MED ORDER — AMISULPRIDE (ANTIEMETIC) 5 MG/2ML IV SOLN: 10.0000 mg | Freq: Once | INTRAVENOUS | Status: DC | PRN

## 2024-02-15 MED ORDER — PHENYLEPHRINE 80 MCG/ML (10ML) SYRINGE FOR IV PUSH (FOR BLOOD PRESSURE SUPPORT)
PREFILLED_SYRINGE | INTRAVENOUS | Status: DC | PRN
  Administered 2024-02-15: 160 ug via INTRAVENOUS
  Administered 2024-02-15: 40 ug via INTRAVENOUS

## 2024-02-15 MED ORDER — ORAL CARE MOUTH RINSE: 15.0000 mL | Freq: Once | OROMUCOSAL | Status: AC

## 2024-02-15 MED ORDER — MIDAZOLAM HCL 2 MG/2ML IJ SOLN
INTRAMUSCULAR | Status: AC
  Filled 2024-02-15: qty 2

## 2024-02-15 MED ORDER — SODIUM CHLORIDE 0.9 % IR SOLN
Status: DC | PRN
  Administered 2024-02-15: 1000 mL

## 2024-02-15 NOTE — Discharge Instructions (Signed)

## 2024-02-15 NOTE — Anesthesia Postprocedure Evaluation (Signed)
"   Anesthesia Post Note  Patient: Theresa Nolan  Procedure(s) Performed: ARTHROPLASTY, HIP, TOTAL, ANTERIOR APPROACH (Right: Hip)     Patient location during evaluation: PACU Anesthesia Type: Spinal Level of consciousness: awake Pain management: pain level controlled Vital Signs Assessment: post-procedure vital signs reviewed and stable Respiratory status: spontaneous breathing, nonlabored ventilation and respiratory function stable Cardiovascular status: blood pressure returned to baseline and stable Postop Assessment: no apparent nausea or vomiting Anesthetic complications: no   No notable events documented.  Last Vitals:  Vitals:   02/15/24 1400 02/15/24 1415  BP: (!) 157/82 (!) 144/78  Pulse: 60   Resp: 11   Temp:    SpO2: 95%     Last Pain:  Vitals:   02/15/24 1451  PainSc: 8                  Geralda Baumgardner P Jadyn Brasher      "

## 2024-02-15 NOTE — Interval H&P Note (Signed)
 History and Physical Interval Note: The patient understands that she is here today for a right total hip replacement to treat her significant right hip pain and arthritis.  There has been no acute or interval change in her medical status.  The risks and benefits of surgery have been discussed in detail and informed consent has been obtained.  The right operative hip has been marked.  02/15/2024 7:19 AM  Theresa Nolan  has presented today for surgery, with the diagnosis of osteoarthritis right hip.  The various methods of treatment have been discussed with the patient and family. After consideration of risks, benefits and other options for treatment, the patient has consented to  Procedures: ARTHROPLASTY, HIP, TOTAL, ANTERIOR APPROACH (Right) as a surgical intervention.  The patient's history has been reviewed, patient examined, no change in status, stable for surgery.  I have reviewed the patient's chart and labs.  Questions were answered to the patient's satisfaction.     Theresa Nolan

## 2024-02-15 NOTE — Transfer of Care (Signed)
 Immediate Anesthesia Transfer of Care Note  Patient: Theresa Nolan  Procedure(s) Performed: ARTHROPLASTY, HIP, TOTAL, ANTERIOR APPROACH (Right: Hip)  Patient Location: PACU  Anesthesia Type:MAC and Spinal  Level of Consciousness: awake, alert , and oriented  Airway & Oxygen Therapy: Patient Spontanous Breathing and Patient connected to nasal cannula oxygen  Post-op Assessment: Report given to RN and Post -op Vital signs reviewed and stable  Post vital signs: Reviewed and stable   Last Vitals:  Vitals Value Taken Time  BP 109/72 02/15/24 11:53  Temp 36 1155  Pulse 66 02/15/24 11:56  Resp 13 02/15/24 11:56  SpO2 96 % 02/15/24 11:56  Vitals shown include unfiled device data.  Last Pain:  Vitals:   02/15/24 0810  PainSc: 4       Patients Stated Pain Goal: 2 (02/15/24 0810)  Complications: No notable events documented.

## 2024-02-15 NOTE — Care Management (Signed)
 Patient with order to DC to home today. Unit staff to provide DME needed for home.    Patient set up from the office prior to admission with [facility] services through [facility].   Liaison for agency has been notified of DC. Information added to AVS  Patient will have family/ friends provide transportation home. No other TOC needs identified for DC

## 2024-02-15 NOTE — Op Note (Signed)
 "    Operative Note  Date of operation: 02/15/2024 Preoperative diagnosis: Right hip primary osteoarthritis Postoperative diagnosis: Same  Procedure: Right direct anterior total hip arthroplasty  Implants: Implant Name Type Inv. Item Serial No. Manufacturer Lot No. LRB No. Used Action  CUP SECTOR GRIPTON - [id number] Cup CUP SECTOR GRIPTON  [employer]PU[employer] [id number] Right 1 Implanted  LINER ACET PNNCL PLUS4 NEUTRAL - [id number] Hips LINER ACET PNNCL PLUS4 NEUTRAL  DEPUY ORTHOPAED[employer] [id number] Right 1 Implanted  FEMORAL STEM 12/14 TPR SZ4 HIP - [id number] Orthopedic Implant FEMORAL STEM 12/14 TPR SZ4 HIP  DEPUY ORTHOPA[employer] [id number] Right 1 Implanted  BALL HIP CERAMIC - [id number] Hips BALL HIP CERAMIC  DEPUY ORTHOPAED[employer] [id number] Right 1 Implanted   Surgeon: Lonni GRADE. Vernetta, MD Assistant: Tory Gaskins, PA-C  Anesthesia: Spinal Antibiotics: IV Ancef  EBL: 200 cc Complications: None  Indications: The patient is an active 69 year old female with well-documented debilitating arthritis involving her right hip.  She does have daily hip pain that is detrimentally affecting her mobility, her quality of life and her actives daily living.  Her x-rays also shows significant arthritis of the right hip.  At this point she was to proceed with a right total hip arthroplasty.  We actually replaced her left hip back in 2019 secondary to arthritis and that is done well.  She is fully aware of the risks of acute blood loss anemia, nerve or vessel injury, fracture, infection, DVT, dislocation, leg length differences, wound implant failure and wound healing issues.  She understands that our goals are hopefully decreased pain, improved mobility and improve quality of life.  Procedure description: After informed consent was obtained and the appropriate right hip was marked, the patient was brought to the operating room and set up on the stretcher where spinal anesthesia was obtained.  She was then laid  in spine position on stretcher and a Foley catheter was placed.  Traction boots were placed on both her feet and next she was placed supine on the Hana fracture table with a perineal post in place and both legs in inline skeletal traction devices but no traction applied.  Her right operative hip and pelvis were assessed radiographically.  The right hip was prepped and draped with DuraPrep and sterile drapes.  A timeout was called and she was notified of the correct patient the correct right hip.  An incision was then made just inferior and posterior to the ASIS and carried slightly obliquely down the leg.  Dissection was carried down to the tensor fascia lata muscle and the tensor fascia was then divided longitudinally to proceed with a direct intra approach the hip.  Circumflex vessels were identified cauterized.  The hip capsule identified and opened up in L-type format finding a moderate joint effusion.  Cobra retractors were placed around the medial and lateral femoral neck and a femoral neck cut was made with an oscillating saw just proximal to the lesser trochanter and this cut was completed with an osteotome.  A corkscrew guide was placed in the femoral head and the femoral head was removed in its entirety and there was a wide area devoid of cartilage.  A bent Hohmann was then placed over the medial acetabular rim and the acetabular labrum and other debris removed.  Reaming was then initiated from a size 43 reamer and stepwise increments going up to a size 49 reamer with all reamers placed under direct visualization and the last reamer also placed under direct fluoroscopy in order to  obtain the depth and reaming, the inclination and the anteversion.  The real size 50 acetabular component was then placed without difficulty followed by a 32+4 polythene liner.  Attention was then turned to the femur.  With the right leg externally rotated to 140 degrees, extended and adducted, a Mueller retractors placed  medially and a Hohmann retractor behind the greater trochanter.  The lateral joint capsule was released and a box cutting osteotome was used into the femoral canal.  Broaching was then initiated using the Actis broaching system from a size 0 going to a size 3.  The size 3 was tight so we trialed a high offset femoral neck based on her anatomy and a 32+1 trial head ball.  The right leg was brought over and up and with traction and internal rotation reduced the pelvis.  We felt like we need to go up to a size 4 stem based on radiographic assessment to give more offset and a longer ball to give more leg length for stability purposes.  Of note preoperatively she was shorter on her right side.  We dislocated the hip and remove the trial components.  We broached up to a size 4 broach and we placed a real Actis femoral component size 4 with high offset and with the real 32+5 ceramic head ball.  Again this reduced the pelvis and we are pleased with leg length, offset, range of motion and stability assessed both radiographically and clinically.  We then irrigated the soft tissue normal saline solution.  Parts of the joint capsule were closed with interrupted #1 Ethibond suture followed by normal Vicryl close the tensor fascia.  0 Vicryl was used to close the deep tissue and 2-0 Vicryl was used to close subcutaneous tissue.  The skin was closed with interrupted 2-0 nylon suture.  An Aquacel dressing was applied.  The patient was taken off the Hana table and taken the recovery room.  Tory Gaskins, PA-C did assist during the entire case and beginning to end and his assistance was crucial and medically necessary for soft tissue management and retraction, helping guide implant placement and a layered closure of the wound. "

## 2024-02-15 NOTE — Evaluation (Signed)
 Physical Therapy Evaluation Patient Details Name: Theresa Nolan MRN: [id number] DOB: 11-08-1955 Today's Date: 02/15/2024  History of Present Illness  69 y.o. female presents to [facility] hospital on 02/15/2024 for elective R THA. PMH includes HLD, HTN, L THA.  Clinical Impression  Pt presents to PT with deficits in functional mobility, gait, balance, strength, Nolan. Prior to mobilizing pt has a bout of emesis. Pt is limited to taking a few steps at bedside due to pain and fatigue. PT provides education on the THA exercise packet and encourages frequent mobilization with staff assistance. PT will follow up tomorrow for a progression of gait and to initiate stair training.           If plan is discharge home, recommend the following: A little help with walking and/or transfers;A little help with bathing/dressing/bathroom;Assistance with cooking/housework;Assist for transportation;Help with stairs or ramp for entrance   Can travel by private vehicle        Equipment Recommendations None recommended by PT  Recommendations for Other Services       Functional Status Assessment Patient has had a recent decline in their functional status and demonstrates the ability to make significant improvements in function in a reasonable and predictable amount of time.     Precautions / Restrictions Precautions Precautions: Fall Recall of Precautions/Restrictions: Intact Precaution/Restrictions Comments: direct anterior THA Restrictions Weight Bearing Restrictions Per Provider Order: Yes RLE Weight Bearing Per Provider Order: Weight bearing as tolerated      Mobility  Bed Mobility Overal bed mobility: Needs Assistance Bed Mobility: Supine to Sit, Sit to Supine     Supine to sit: Mod assist, HOB elevated, Used rails Sit to supine: Min assist        Transfers Overall transfer level: Needs assistance Equipment used: Rolling walker (2 wheels) Transfers: Sit to/from Stand Sit to Stand: Contact  guard assist, From elevated surface                Ambulation/Gait Ambulation/Gait assistance: Contact guard assist Gait Distance (Feet): 3 Feet Assistive device: Rolling walker (2 wheels) Gait Pattern/deviations: Step-to pattern Gait velocity: reduced Gait velocity interpretation: <1.31 ft/sec, indicative of household ambulator   General Gait Details: slowed step-to gait, R foot drag due to poor hip flexion to clear the floor  Stairs            Wheelchair Mobility     Tilt Bed    Modified Rankin (Stroke Patients Only)       Balance Overall balance assessment: Needs assistance Sitting-balance support: No upper extremity supported, Feet supported Sitting balance-Leahy Scale: Fair     Standing balance support: Bilateral upper extremity supported, Reliant on assistive device for balance Standing balance-Leahy Scale: Poor                               Pertinent Vitals/Pain Pain Assessment Pain Assessment: 0-10 Pain Score: 7  Pain Location: R hip Pain Descriptors / Indicators: Aching Pain Intervention(s): Limited activity within patient's tolerance    Home Living Family/patient expects to be discharged to:: Private residence Living Arrangements: Children (pt is discharging to her daughter's home initially) Available Help at Discharge: Family;Available PRN/intermittently Type of Home: House Home Access: Stairs to enter Entrance Stairs-Rails: None Entrance Stairs-Number of Steps: 1   Home Layout: One level Home Equipment: Agricultural [employer]ant (2 wheels);Toilet riser      Prior Function Prior Level of Function : Independent/Modified Independent;Working/employed;Driving  Mobility Comments: RN in flex pool at [employer]       Extremity/Trunk Assessment   Upper Extremity Assessment Upper Extremity Assessment: Overall WFL for tasks assessed    Lower Extremity Assessment Lower Extremity Assessment: RLE deficits/detail RLE  Deficits / Details: generalized post-op weakness as anticipated on POD  0    Cervical / Trunk Assessment Cervical / Trunk Assessment: Other exceptions Cervical / Trunk Exceptions: body habitus  Communication   Communication Communication: No apparent difficulties    Cognition Arousal: Alert Behavior During Therapy: WFL for tasks assessed/performed   PT - Cognitive impairments: No apparent impairments                         Following commands: Intact       Cueing Cueing Techniques: Verbal cues     General Comments General comments (skin integrity, edema, etc.): VSS on RA, prior to mobilizing pt has a bout of emesis. Pt in NAD for the remainder of session    Exercises Other Exercises Other Exercises: PT provides education regarding surgical hip exercise packet   Assessment/Plan    PT Assessment Patient needs continued PT services  PT Problem List Decreased strength;Decreased activity tolerance;Decreased range of motion;Decreased balance;Decreased mobility;Decreased knowledge of use of DME;Pain       PT Treatment Interventions DME instruction;Gait training;Stair training;Functional mobility training;Therapeutic activities;Therapeutic exercise;Balance training;Neuromuscular re-education;Patient/family education    PT Goals (Current goals can be found in the Care Plan section)  Acute Rehab PT Goals Patient Stated Goal: to return to independence PT Goal Formulation: With patient Time For Goal Achievement: 02/19/24 Potential to Achieve Goals: Good    Frequency 7X/week     Co-evaluation               AM-PAC PT 6 Clicks Mobility  Outcome Measure Help needed turning from your back to your side while in a flat bed without using bedrails?: A Little Help needed moving from lying on your back to sitting on the side of a flat bed without using bedrails?: A Lot Help needed moving to and from a bed to a chair (including a wheelchair)?: A Little Help needed  standing up from a chair using your arms (e.g., wheelchair or bedside chair)?: A Little Help needed to walk in hospital room?: Total Help needed climbing 3-5 steps with a railing? : Total 6 Click Score: 13    End of Session Equipment Utilized During Treatment: Gait belt Activity Tolerance: Patient tolerated treatment well Patient left: in bed;with call bell/phone within reach;with bed alarm set Nurse Communication: Mobility status PT Visit Diagnosis: Other abnormalities of gait and mobility (R26.89);Muscle weakness (generalized) (M62.81);Pain Pain - Right/Left: Right Pain - part of body: Hip    Time: 1717-1750 PT Time Calculation (min) (ACUTE ONLY): 33 min   Charges:   PT Evaluation $PT Eval Low Complexity: 1 Low   PT General Charges $$ ACUTE PT VISIT: 1 Visit         Bernardino JINNY Ruth, PT, DPT Acute Rehabilitation Office [phone]   Bernardino JINNY Ruth 02/15/2024, 6:00 PM

## 2024-02-15 NOTE — Anesthesia Procedure Notes (Signed)
 Spinal  Patient location during procedure: OR Start time: 02/15/2024 10:06 AM End time: 02/15/2024 10:09 AM Reason for block: surgical anesthesia  Staffing Performed: anesthesiologist  Authorized by: Patrisha Bernardino SQUIBB, MD   Performed by: Merla Almarie HERO, DO  Preanesthetic Checklist Completed: patient identified, IV checked, risks and benefits discussed, surgical consent, monitors and equipment checked, pre-op evaluation and timeout performed Spinal Block Patient position: sitting Prep: DuraPrep and site prepped and draped Patient monitoring: cardiac monitor, continuous pulse ox and blood pressure Approach: midline Location: L3-4 Injection technique: single-shot Needle Needle type: Pencan  Needle gauge: 24 G Needle length: 9 cm Assessment Sensory level: T6 Events: CSF return  Additional Notes Functioning IV was confirmed and monitors were applied. Sterile prep and drape, including hand hygiene and sterile gloves were used. The patient was positioned and the spine was prepped. The skin was anesthetized with lidocaine .  Free flow of clear CSF was obtained prior to injecting local anesthetic into the CSF.  The spinal needle aspirated freely following injection.  The needle was carefully withdrawn.  The patient tolerated the procedure well.

## 2024-02-16 ENCOUNTER — Encounter (HOSPITAL_COMMUNITY): Payer: Self-pay | Admitting: Orthopaedic Surgery

## 2024-02-16 DIAGNOSIS — M1611 Unilateral primary osteoarthritis, right hip: Secondary | ICD-10-CM | POA: Diagnosis not present

## 2024-02-16 LAB — CBC
HCT: 32.5 % — ABNORMAL LOW (ref 36.0–46.0)
Hemoglobin: 10.9 g/dL — ABNORMAL LOW (ref 12.0–15.0)
MCH: 31.1 pg (ref 26.0–34.0)
MCHC: 33.5 g/dL (ref 30.0–36.0)
MCV: 92.9 fL (ref 80.0–100.0)
Platelets: 275 10*3/uL (ref 150–400)
RBC: 3.5 MIL/uL — ABNORMAL LOW (ref 3.87–5.11)
RDW: 12.4 % (ref 11.5–15.5)
WBC: 10.6 10*3/uL — ABNORMAL HIGH (ref 4.0–10.5)
nRBC: 0 % (ref 0.0–0.2)

## 2024-02-16 LAB — BASIC METABOLIC PANEL WITH GFR
Anion gap: 9 (ref 5–15)
BUN: 10 mg/dL (ref 8–23)
CO2: 27 mmol/L (ref 22–32)
Calcium: 8.7 mg/dL — ABNORMAL LOW (ref 8.9–10.3)
Chloride: 100 mmol/L (ref 98–111)
Creatinine, Ser: 0.84 mg/dL (ref 0.44–1.00)
GFR, Estimated: >60 mL/min
Glucose, Bld: 121 mg/dL — ABNORMAL HIGH (ref 70–99)
Potassium: 4.1 mmol/L (ref 3.5–5.1)
Sodium: 136 mmol/L (ref 135–145)

## 2024-02-16 MED ORDER — TIZANIDINE HCL 4 MG PO TABS
4.0000 mg | ORAL_TABLET | Freq: Four times a day (QID) | ORAL | Status: DC | PRN
  Administered 2024-02-16 – 2024-02-17 (×4): 4 mg via ORAL
  Filled 2024-02-16 (×4): qty 1

## 2024-02-16 MED ORDER — KETOROLAC TROMETHAMINE 15 MG/ML IJ SOLN
7.5000 mg | Freq: Four times a day (QID) | INTRAMUSCULAR | Status: AC
  Administered 2024-02-16 (×3): 7.5 mg via INTRAVENOUS
  Filled 2024-02-16 (×3): qty 1

## 2024-02-16 NOTE — Progress Notes (Signed)
 Subjective: 1 Day Post-Op Procedures (LRB): ARTHROPLASTY, HIP, TOTAL, ANTERIOR APPROACH (Right) Patient reports pain as moderate.  States that her operative thigh is much more swollen than she remembered from first of the surgery in 2019.  Also the pain is worse and she has having trouble lifting her leg.  I reiterated that this can be normal usually postop with hip replacements.  Objective: Vital signs in last 24 hours: Temp:  [97.8 F (36.6 C)-100.2 F (37.9 C)] 98.9 F (37.2 C) (01/28 0727) Pulse Rate:  [53-86] 79 (01/28 0727) Resp:  [10-19] 18 (01/28 0727) BP: (98-157)/(65-101) 129/65 (01/28 0727) SpO2:  [94 %-100 %] 96 % (01/28 0727)  Intake/Output from previous day: 01/27 0701 - 01/28 0700 In: 1180 [P.O.:480; I.V.:500; IV Piggyback:200] Out: 3600 [Urine:3400; Blood:200] Intake/Output this shift: No intake/output data recorded.  Recent Labs    02/16/24 0633  HGB 10.9*   Recent Labs    02/16/24 0633  WBC 10.6*  RBC 3.50*  HCT 32.5*  PLT 275   Recent Labs    02/16/24 0633  NA 136  K 4.1  CL 100  CO2 27  BUN 10  CREATININE 0.84  GLUCOSE 121*  CALCIUM  8.7*   No results for input(s): LABPT, INR in the last 72 hours.  Sensation intact distally Intact pulses distally Dorsiflexion/Plantar flexion intact Incision: dressing C/D/I   Assessment/Plan: 1 Day Post-Op Procedures (LRB): ARTHROPLASTY, HIP, TOTAL, ANTERIOR APPROACH (Right) Up with therapy  Will try some doses of Toradol  today and switch from Robaxin  to Zanaflex .    Theresa Nolan 02/16/2024, 7:55 AM

## 2024-02-16 NOTE — Progress Notes (Signed)
 Physical Therapy Treatment Patient Details Name: Theresa Nolan MRN: [id number] DOB: 05/11/1955 Today's Date: 02/16/2024   History of Present Illness 69 y.o. female presents to [facility] hospital on 02/15/2024 for elective R THA. PMH includes HLD, HTN, L THA.    PT Comments  Pt tolerates treatment well, demonstrating improved activation of RLE musculature during bed mobility and improve hip flexion throughout gait cycle. Pt is able to ambulate for increased distances this session and requires less time to complete transfers. Pt will benefit from further gait and stair training prior to discharge.   If plan is discharge home, recommend the following: A little help with walking and/or transfers;A little help with bathing/dressing/bathroom;Assistance with cooking/housework;Assist for transportation;Help with stairs or ramp for entrance   Can travel by private vehicle        Equipment Recommendations  None recommended by PT    Recommendations for Other Services       Precautions / Restrictions Precautions Precautions: Fall Recall of Precautions/Restrictions: Intact Precaution/Restrictions Comments: direct anterior THA Restrictions Weight Bearing Restrictions Per Provider Order: Yes RLE Weight Bearing Per Provider Order: Weight bearing as tolerated     Mobility  Bed Mobility Overal bed mobility: Needs Assistance Bed Mobility: Supine to Sit     Supine to sit: Min assist, HOB elevated, Used rails Sit to supine: Mod assist        Transfers Overall transfer level: Needs assistance Equipment used: Rolling walker (2 wheels) Transfers: Sit to/from Stand Sit to Stand: Contact guard assist, From elevated surface           General transfer comment: continued verbal cues for hand placement    Ambulation/Gait Ambulation/Gait assistance: Contact guard assist Gait Distance (Feet): 60 Feet Assistive device: Rolling walker (2 wheels) Gait Pattern/deviations: Step-to pattern, Decreased  stance time - right, Decreased dorsiflexion - right Gait velocity: reduced Gait velocity interpretation: <1.31 ft/sec, indicative of household ambulator   General Gait Details: pt with slowed step-to gait, more consistent foot clearance of RLE   Stairs             Wheelchair Mobility     Tilt Bed    Modified Rankin (Stroke Patients Only)       Balance Overall balance assessment: Needs assistance Sitting-balance support: No upper extremity supported, Feet supported Sitting balance-Leahy Scale: Good     Standing balance support: Bilateral upper extremity supported Standing balance-Leahy Scale: Poor                              Communication Communication Communication: No apparent difficulties  Cognition Arousal: Alert Behavior During Therapy: WFL for tasks assessed/performed   PT - Cognitive impairments: No apparent impairments                         Following commands: Intact      Cueing Cueing Techniques: Verbal cues  Exercises Other Exercises Other Exercises: PT encourages performance of quad sets for improve RLE quad activation, hip abduction in supine and heel slides for improve hip and knee flexor activation    General Comments General comments (skin integrity, edema, etc.): VSS on RA      Pertinent Vitals/Pain Pain Assessment Pain Assessment: 0-10 Pain Score: 7  Faces Pain Scale: Hurts whole lot Pain Location: R hip Pain Descriptors / Indicators: Sore Pain Intervention(s): Monitored during session    Home Living  Prior Function            PT Goals (current goals can now be found in the care plan section) Acute Rehab PT Goals Patient Stated Goal: to return to independence Progress towards PT goals: Progressing toward goals    Frequency    7X/week      PT Plan      Co-evaluation              AM-PAC PT 6 Clicks Mobility   Outcome Measure  Help needed turning  from your back to your side while in a flat bed without using bedrails?: A Little Help needed moving from lying on your back to sitting on the side of a flat bed without using bedrails?: A Little Help needed moving to and from a bed to a chair (including a wheelchair)?: A Little Help needed standing up from a chair using your arms (e.g., wheelchair or bedside chair)?: A Little Help needed to walk in hospital room?: A Little Help needed climbing 3-5 steps with a railing? : Total 6 Click Score: 16    End of Session Equipment Utilized During Treatment: Gait belt Activity Tolerance: Patient tolerated treatment well Patient left: in chair;with call bell/phone within reach Nurse Communication: Mobility status PT Visit Diagnosis: Other abnormalities of gait and mobility (R26.89);Muscle weakness (generalized) (M62.81);Pain Pain - Right/Left: Right Pain - part of body: Hip     Time: 1351-1420 PT Time Calculation (min) (ACUTE ONLY): 29 min  Charges:    $Gait Training: 8-22 mins $Therapeutic Activity: 8-22 mins PT General Charges $$ ACUTE PT VISIT: 1 Visit                     Bernardino JINNY Ruth, PT, DPT Acute Rehabilitation Office [phone]    Bernardino JINNY Ruth 02/16/2024, 2:38 PM

## 2024-02-16 NOTE — Progress Notes (Signed)
 Physical Therapy Treatment Patient Details Name: Theresa Nolan MRN: [id number] DOB: August 03, 1955 Today's Date: 02/16/2024   History of Present Illness 69 y.o. female presents to [facility] hospital on 02/15/2024 for elective R THA. PMH includes HLD, HTN, L THA.    PT Comments  Pt remains limited by reports of pain in RLE, despite recently receiving IV dilaudid  prior to transferring. Pt ambulates for short household distances with labored gait pattern. Pt is encouraged to perform RLE heel slides and quad sets in an effort to improve muscle activation. PT will follow in an effort to progress activity tolerance and LE strength.    If plan is discharge home, recommend the following: A little help with walking and/or transfers;A little help with bathing/dressing/bathroom;Assistance with cooking/housework;Assist for transportation;Help with stairs or ramp for entrance   Can travel by private vehicle        Equipment Recommendations  None recommended by PT    Recommendations for Other Services       Precautions / Restrictions Precautions Precautions: Fall Recall of Precautions/Restrictions: Intact Precaution/Restrictions Comments: direct anterior THA Restrictions Weight Bearing Restrictions Per Provider Order: Yes RLE Weight Bearing Per Provider Order: Weight bearing as tolerated     Mobility  Bed Mobility Overal bed mobility: Needs Assistance Bed Mobility: Sit to Supine       Sit to supine: Mod assist        Transfers Overall transfer level: Needs assistance Equipment used: Rolling walker (2 wheels) Transfers: Sit to/from Stand Sit to Stand: Contact guard assist, From elevated surface                Ambulation/Gait Ambulation/Gait assistance: Contact guard assist Gait Distance (Feet): 20 Feet Assistive device: Rolling walker (2 wheels) Gait Pattern/deviations: Step-to pattern, Decreased stance time - right, Decreased dorsiflexion - right Gait velocity: reduced Gait  velocity interpretation: <1.31 ft/sec, indicative of household ambulator   General Gait Details: pt with slowed step-to gait, verbal cues for gait sequencing. Pt still with intermittent R foot drag due to lack of hip flexion strength   Stairs             Wheelchair Mobility     Tilt Bed    Modified Rankin (Stroke Patients Only)       Balance Overall balance assessment: Needs assistance Sitting-balance support: No upper extremity supported, Feet supported Sitting balance-Leahy Scale: Good     Standing balance support: Bilateral upper extremity supported, Reliant on assistive device for balance Standing balance-Leahy Scale: Poor                              Communication Communication Communication: No apparent difficulties  Cognition Arousal: Alert Behavior During Therapy: WFL for tasks assessed/performed   PT - Cognitive impairments: No apparent impairments                         Following commands: Intact      Cueing Cueing Techniques: Verbal cues  Exercises Other Exercises Other Exercises: PT encourages performance of quad sets for improve RLE quad activation and heel slides for improve hip and knee flexor activation    General Comments General comments (skin integrity, edema, etc.): VSS on RA      Pertinent Vitals/Pain Pain Assessment Pain Assessment: Faces Faces Pain Scale: Hurts whole lot Pain Location: R hip Pain Descriptors / Indicators: Sore Pain Intervention(s): RN gave pain meds during session    Home  Living                          Prior Function            PT Goals (current goals can now be found in the care plan section) Acute Rehab PT Goals Patient Stated Goal: to return to independence Progress towards PT goals: Progressing toward goals (slowly)    Frequency    7X/week      PT Plan      Co-evaluation              AM-PAC PT 6 Clicks Mobility   Outcome Measure  Help needed  turning from your back to your side while in a flat bed without using bedrails?: A Little Help needed moving from lying on your back to sitting on the side of a flat bed without using bedrails?: A Lot Help needed moving to and from a bed to a chair (including a wheelchair)?: A Lot Help needed standing up from a chair using your arms (e.g., wheelchair or bedside chair)?: A Lot Help needed to walk in hospital room?: A Little Help needed climbing 3-5 steps with a railing? : Total 6 Click Score: 13    End of Session Equipment Utilized During Treatment: Gait belt Activity Tolerance: Patient limited by pain Patient left: in bed;with call bell/phone within reach Nurse Communication: Mobility status PT Visit Diagnosis: Other abnormalities of gait and mobility (R26.89);Muscle weakness (generalized) (M62.81);Pain Pain - Right/Left: Right Pain - part of body: Hip     Time: 8875-8857 PT Time Calculation (min) (ACUTE ONLY): 18 min  Charges:    $Gait Training: 8-22 mins PT General Charges $$ ACUTE PT VISIT: 1 Visit                     Bernardino JINNY Ruth, PT, DPT Acute Rehabilitation Office [phone]    Bernardino JINNY Ruth 02/16/2024, 1:42 PM

## 2024-02-17 ENCOUNTER — Other Ambulatory Visit (HOSPITAL_COMMUNITY): Payer: Self-pay

## 2024-02-17 ENCOUNTER — Telehealth: Payer: Self-pay | Admitting: Physician Assistant

## 2024-02-17 DIAGNOSIS — M1611 Unilateral primary osteoarthritis, right hip: Secondary | ICD-10-CM | POA: Diagnosis not present

## 2024-02-17 LAB — CBC
HCT: 30.4 % — ABNORMAL LOW (ref 36.0–46.0)
Hemoglobin: 10.3 g/dL — ABNORMAL LOW (ref 12.0–15.0)
MCH: 31.1 pg (ref 26.0–34.0)
MCHC: 33.9 g/dL (ref 30.0–36.0)
MCV: 91.8 fL (ref 80.0–100.0)
Platelets: 258 10*3/uL (ref 150–400)
RBC: 3.31 MIL/uL — ABNORMAL LOW (ref 3.87–5.11)
RDW: 12.1 % (ref 11.5–15.5)
WBC: 12 10*3/uL — ABNORMAL HIGH (ref 4.0–10.5)
nRBC: 0 % (ref 0.0–0.2)

## 2024-02-17 MED ORDER — TIZANIDINE HCL 4 MG PO TABS
4.0000 mg | ORAL_TABLET | Freq: Four times a day (QID) | ORAL | 0 refills | Status: AC | PRN
  Filled 2024-02-17: qty 30, 8d supply, fill #0

## 2024-02-17 MED ORDER — ASPIRIN 81 MG PO CHEW
81.0000 mg | CHEWABLE_TABLET | Freq: Two times a day (BID) | ORAL | 0 refills | Status: AC
  Filled 2024-02-17: qty 35, 18d supply, fill #0

## 2024-02-17 MED ORDER — ONDANSETRON HCL 4 MG/2ML IJ SOLN
4.0000 mg | Freq: Four times a day (QID) | INTRAMUSCULAR | 0 refills | Status: DC | PRN
  Filled 2024-02-17: qty 2, 1d supply, fill #0

## 2024-02-17 MED ORDER — OXYCODONE HCL 5 MG PO TABS
5.0000 mg | ORAL_TABLET | ORAL | 0 refills | Status: AC | PRN
  Filled 2024-02-17: qty 30, 3d supply, fill #0

## 2024-02-17 MED ORDER — ONDANSETRON HCL 4 MG PO TABS
4.0000 mg | ORAL_TABLET | Freq: Four times a day (QID) | ORAL | 0 refills | Status: AC | PRN
  Filled 2024-02-17: qty 20, 5d supply, fill #0

## 2024-02-17 NOTE — Plan of Care (Signed)
 Pt and daughter given D/C instructions with verbal understanding. Rx's were sent to the pharmacy by MD. Pt's incision is clean and dry with no sign of infection. Pt's IV was removed prior to D/C. Home Health was arranged by MD office prior to D/C. Pt received 3-n-1 from [facility] per MD order. Pt D/C'd home via wheelchair per MD order. Pt is stable @ D/C and has no other needs at this time. Rosina Rakers, RN

## 2024-02-17 NOTE — Telephone Encounter (Signed)
 Called pharmacy, taken care of already.

## 2024-02-17 NOTE — Progress Notes (Signed)
 Call from PA this morning informing that patient will be going home with her daughter who lives in [place], [place]. Referral made to [facility] agency, but they do not cover into [facility], which is where address for daughter is. Referral to [facility], who has accepted. Call to daughter and left VM with update of HH agency for home PT.

## 2024-02-17 NOTE — Discharge Summary (Signed)
 " Patient ID: Theresa Nolan MRN: [id number] DOB/AGE: [birth year] 68 y.o.  Admit date: 02/15/2024 Discharge date: 02/17/2024  Admission Diagnoses:  Principal Problem:   Unilateral primary osteoarthritis, right hip Active Problems:   Status post total replacement of right hip   Discharge Diagnoses:  Same  Past Medical History:  Diagnosis Date   Chronic back pain    Chronic kidney disease    Degenerative joint disease    GERD (gastroesophageal reflux disease)    Hypercholesteremia    Hypertension    Hypothyroidism    Lacunar infarction (HCC)    Stroke (HCC)    Lacunar - no residule   TSH elevation     Surgeries: Procedures: ARTHROPLASTY, HIP, TOTAL, ANTERIOR APPROACH on 02/15/2024   Consultants:   Discharged Condition: Improved  Hospital Course: Theresa Nolan is an 69 y.o. female who was admitted 02/15/2024 for operative treatment ofUnilateral primary osteoarthritis, right hip. Patient has severe unremitting pain that affects sleep, daily activities, and work/hobbies. After pre-op clearance the patient was taken to the operating room on 02/15/2024 and underwent  Procedures: ARTHROPLASTY, HIP, TOTAL, ANTERIOR APPROACH.    Patient was given perioperative antibiotics:  Anti-infectives (From admission, onward)    Start     Dose/Rate Route Frequency Ordered Stop   02/15/24 1645  ceFAZolin  (ANCEF ) IVPB 2g/100 mL premix        2 g 200 mL/hr over 30 Minutes Intravenous Every 6 hours 02/15/24 1545 02/16/24 0032   02/15/24 0815  ceFAZolin  (ANCEF ) IVPB 2g/100 mL premix        2 g 200 mL/hr over 30 Minutes Intravenous On call to O.R. 02/15/24 0800 02/15/24 1041        Patient was given sequential compression devices, early ambulation, and chemoprophylaxis to prevent DVT.  Inpatient Morphine  Milligram Equivalents Per Day 1/27 - 1/29   Values displayed are in units of MME/Day    Order Start / End Date 1/27 Yesterday Today    fentaNYL  (SUBLIMAZE ) injection 25-50 mcg 1/27 -  1/27 45 of 45-90 -- --    HYDROmorphone  (DILAUDID ) injection 0.5-1 mg 1/27 - No end date 40 of 30-60 20 of 60-120 0 of 60-120    oxyCODONE  (Oxy IR/ROXICODONE ) immediate release tablet 5-10 mg 1/27 - No end date 0 of 22.5-45 0 of 45-90 0 of 45-90    oxyCODONE  (Oxy IR/ROXICODONE ) immediate release tablet 10-15 mg 1/27 - No end date 30 of 45-67.5 75 of 90-135 45 of 90-135    Daily Totals  115 of 142.5-262.5 95 of 195-345 45 of 195-345       Patient benefited maximally from hospital stay and there were no complications.    Recent vital signs: Patient Vitals for the past 24 hrs:  BP Temp Temp src Pulse Resp SpO2  02/17/24 0910 102/65 -- -- 69 17 92 %  02/16/24 2339 (!) 106/56 98.9 F (37.2 C) Oral 66 20 100 %  02/16/24 2027 122/65 98.2 F (36.8 C) Oral 69 18 95 %  02/16/24 1724 134/64 -- -- 67 17 99 %     Recent laboratory studies:  Recent Labs    02/16/24 0633 02/17/24 0723  WBC 10.6* 12.0*  HGB 10.9* 10.3*  HCT 32.5* 30.4*  PLT 275 258  NA 136  --   K 4.1  --   CL 100  --   CO2 27  --   BUN 10  --   CREATININE 0.84  --   GLUCOSE 121*  --  CALCIUM  8.7*  --      Discharge Medications:   Allergies as of 02/17/2024       Reactions   Lisinopril Cough        Medication List     STOP taking these medications    methocarbamol  750 MG tablet Commonly known as: ROBAXIN    traMADol  50 MG tablet Commonly known as: ULTRAM        TAKE these medications    amLODipine  10 MG tablet Commonly known as: NORVASC  Take 1 tablet (10 mg total) by mouth daily.   aspirin  81 MG chewable tablet Chew 1 tablet (81 mg total) by mouth 2 (two) times daily. What changed: when to take this   carvedilol  12.5 MG tablet Commonly known as: COREG  Take 1 tablet (12.5 mg total) by mouth 2 (two) times daily with food   carvedilol  12.5 MG tablet Commonly known as: COREG  Take 1 tablet (12.5 mg total) by mouth 2 (two) times daily with food.   co-enzyme Q-10 30 MG capsule Take 30 mg by  mouth every evening.   ezetimibe  10 MG tablet Commonly known as: ZETIA  TAKE 1 TABLET BY MOUTH ONCE A DAY   ezetimibe  10 MG tablet Commonly known as: ZETIA  Take 1 tablet (10 mg total) by mouth daily.   Fish Oil 1000 MG Caps Take 2,000 mg by mouth daily.   gabapentin  300 MG capsule Commonly known as: NEURONTIN  Take 1 capsule (300 mg total) by mouth 2 (two) times daily.   levothyroxine  50 MCG tablet Commonly known as: SYNTHROID  Take 1 tablet (50 mcg total) by mouth in the morning on an empty stomach.   losartan  100 MG tablet Commonly known as: COZAAR  TAKE 1 TABLET BY MOUTH ONCE DAILY.   losartan  100 MG tablet Commonly known as: COZAAR  Take 1 tablet (100 mg total) by mouth daily.   Muscle Rub 10-15 % Crea Apply 1 application topically as needed for muscle pain.   omeprazole  20 MG capsule Commonly known as: PRILOSEC TAKE 1 CAPSULE BY MOUTH TWICE DAILY.   omeprazole  20 MG capsule Commonly known as: PRILOSEC Take 1 capsule (20 mg total) by mouth 2 (two) times daily.   ondansetron  4 MG/2ML Soln injection Commonly known as: ZOFRAN  Inject 2 mLs (4 mg total) into the vein every 6 (six) hours as needed for nausea.   OSTEO BI-FLEX REGULAR STRENGTH PO Take 2 tablets by mouth daily.   oxyCODONE  5 MG immediate release tablet Commonly known as: Oxy IR/ROXICODONE  Take 1-2 tablets (5-10 mg total) by mouth every 4 (four) hours as needed for moderate pain (pain score 4-6) (pain score 4-6).   simvastatin  40 MG tablet Commonly known as: ZOCOR  Take 1 tablet (40 mg total) by mouth every evening.   tiZANidine  4 MG tablet Commonly known as: ZANAFLEX  Take 1 tablet (4 mg total) by mouth every 6 (six) hours as needed for muscle spasms.   TURMERIC PO Take 2 tablets by mouth every evening.   Vitamin D  50 MCG (2000 UT) tablet Take 2,000 Units by mouth daily.               Durable Medical Equipment  (From admission, onward)           Start     Ordered   02/15/24 1546   DME 3 n 1  Once        02/15/24 1545   02/15/24 1546  DME Walker rolling  Once       Question Answer Comment  Walker:  With 5 Inch Wheels   Patient needs a walker to treat with the following condition Status post total replacement of right hip      02/15/24 1545            Diagnostic Studies: DG Pelvis Portable Result Date: 02/15/2024 EXAM: 1 VIEW XRAY OF THE PELVIS 02/15/2024 12:12:00 PM COMPARISON: Comparison with 09/30/2023. CLINICAL HISTORY: Status post total replacement of right hip. FINDINGS: BONES AND JOINTS: Bilateral hip arthroplasty noted. Intact right hip arthroplasty hardware with normal alignment. No acute fracture. No malalignment. SOFT TISSUES: Gas in right hip soft tissues consistent with recent surgery. IMPRESSION: 1. Status post bilateral hip arthroplasty with intact right hip arthroplasty hardware and normal alignment. 2. Gas in the right hip soft tissues consistent with recent surgery. Electronically signed by: Elsie Gravely MD 02/15/2024 08:37 PM EST RP Workstation: [id number]   DG HIP UNILAT WITH PELVIS 1V RIGHT Result Date: 02/15/2024 CLINICAL DATA:  Right hip arthroplasty. EXAM: DG HIP (WITH OR WITHOUT PELVIS) 1V RIGHT COMPARISON:  None Available. FINDINGS: Intraoperative imaging was obtained with a C-arm during right total hip arthroplasty. Frontal projection demonstrates normal alignment after arthroplasty. IMPRESSION: Intraoperative imaging during right total hip arthroplasty. Electronically Signed   By: Marcey Moan M.D.   On: 02/15/2024 15:23    Disposition: Discharge disposition: 06-Home-Health Care Svc          Contact information for follow-up providers     Vernetta Lonni GRADE, MD Follow up in 2 week(s).   Specialty: Orthopedic Surgery Contact information: [address] [place]  [place] [address] [phone]              Contact information for after-discharge care     Home Medical Care     [facility]  [facility]) .   Service: Home Health Services Contact information: [address] [place]  [address] [phone]                      Signed: BERTRUM GASKINS 02/17/2024, 9:47 AM    "

## 2024-02-17 NOTE — Progress Notes (Signed)
 Subjective: 2 Days Post-Op Procedures (LRB): ARTHROPLASTY, HIP, TOTAL, ANTERIOR APPROACH (Right) Patient reports pain as 5 on 0-10 scale.  No dizziness. States she did well with PT this morning. Going to daughters home in [place].   Objective: Vital signs in last 24 hours: Temp:  [98.2 F (36.8 C)-98.9 F (37.2 C)] 98.9 F (37.2 C) (01/28 2339) Pulse Rate:  [66-69] 69 (01/29 0910) Resp:  [17-20] 17 (01/29 0910) BP: (102-134)/(56-65) 102/65 (01/29 0910) SpO2:  [92 %-100 %] 92 % (01/29 0910)  Intake/Output from previous day: 01/28 0701 - 01/29 0700 In: -  Out: 1250 [Urine:1250] Intake/Output this shift: No intake/output data recorded.  Recent Labs    02/16/24 0633 02/17/24 0723  HGB 10.9* 10.3*   Recent Labs    02/16/24 0633 02/17/24 0723  WBC 10.6* 12.0*  RBC 3.50* 3.31*  HCT 32.5* 30.4*  PLT 275 258   Recent Labs    02/16/24 0633  NA 136  K 4.1  CL 100  CO2 27  BUN 10  CREATININE 0.84  GLUCOSE 121*  CALCIUM  8.7*   No results for input(s): LABPT, INR in the last 72 hours.  Dorsiflexion/Plantar flexion intact Incision: dressing C/D/I Compartment soft   Assessment/Plan: 2 Days Post-Op Procedures (LRB): ARTHROPLASTY, HIP, TOTAL, ANTERIOR APPROACH (Right) Up with therapy Discharge home with home health      Theresa Nolan 02/17/2024, 9:22 AM

## 2024-02-17 NOTE — Telephone Encounter (Signed)
 Dandra from the pharmacy called saying that they can't do the injection that was ordered , they can only do the pills or oral liquid. Call back number is [phone].

## 2024-02-17 NOTE — Progress Notes (Signed)
 Physical Therapy Treatment Patient Details Name: Theresa Nolan MRN: [id number] DOB: 15-Jul-1955 Today's Date: 02/17/2024   History of Present Illness 69 y.o. female presents to  [facility] hospital on 02/15/2024 for elective R THA. PMH includes HLD, HTN, L THA.    PT Comments  Patient received up in recliner (has been sleeping in recliner because bed is so uncomfortable). Transfers with supervision and ambulates with supervision x 180 ft with RW. Continues with decr clearance of RLE, however when concentrates on Rt heelstrike, she consistenly advances RLE. Educated on up/down single step (initial 4 step and progressed to 7 step) backwards to ascend and forwards to descend with RW. Handout provided. Patient reports feeling good about ability to get into her daughter's home now.     If plan is discharge home, recommend the following: Assistance with cooking/housework;Assist for transportation;Help with stairs or ramp for entrance   Can travel by private vehicle        Equipment Recommendations  None recommended by PT    Recommendations for Other Services       Precautions / Restrictions Precautions Precautions: Fall Recall of Precautions/Restrictions: Intact Precaution/Restrictions Comments: direct anterior THA Restrictions Weight Bearing Restrictions Per Provider Order: Yes RLE Weight Bearing Per Provider Order: Weight bearing as tolerated     Mobility  Bed Mobility               General bed mobility comments: up in recliner and return to recliner    Transfers Overall transfer level: Needs assistance Equipment used: Rolling walker (2 wheels) Transfers: Sit to/from Stand Sit to Stand: Supervision           General transfer comment: from recliner with armrests; repeated x2    Ambulation/Gait Ambulation/Gait assistance: Contact guard assist Gait Distance (Feet): 180 Feet Assistive device: Rolling walker (2 wheels) Gait Pattern/deviations: Decreased stance time -  right, Decreased dorsiflexion - right, Step-through pattern Gait velocity: reduced     General Gait Details: pt with slowed gait, more consistent foot clearance of RLE, especially when she focuses on Rt heelstrike   Stairs Stairs: Yes Stairs assistance: Contact guard assist Stair Management: No rails, Step to pattern, Backwards, With walker Number of Stairs: 1 (x1 4 platform; x1 7 step) General stair comments: pt required cues for sequencing on initial platform, practiced taller step as her single step will be taller than our practice platform; states it felt almost effortless and feels good about ability to get into house   Wheelchair Mobility     Tilt Bed    Modified Rankin (Stroke Patients Only)       Balance Overall balance assessment: Needs assistance Sitting-balance support: No upper extremity supported, Feet supported Sitting balance-Leahy Scale: Good     Standing balance support: Bilateral upper extremity supported Standing balance-Leahy Scale: Poor                              Communication Communication Communication: No apparent difficulties  Cognition Arousal: Alert Behavior During Therapy: WFL for tasks assessed/performed   PT - Cognitive impairments: No apparent impairments                         Following commands: Intact      Cueing Cueing Techniques: Verbal cues  Exercises      General Comments        Pertinent Vitals/Pain Pain Assessment Pain Assessment: Faces Faces Pain Scale: Hurts little more  Pain Location: R hip Pain Descriptors / Indicators: Sore Pain Intervention(s): Limited activity within patient's tolerance, Monitored during session, Patient requesting pain meds-RN notified, RN gave pain meds during session    Home Living                          Prior Function            PT Goals (current goals can now be found in the care plan section) Acute Rehab PT Goals Patient Stated Goal: to  return to independence Time For Goal Achievement: 02/19/24 Potential to Achieve Goals: Good Progress towards PT goals: Progressing toward goals    Frequency    7X/week      PT Plan      Co-evaluation              AM-PAC PT 6 Clicks Mobility   Outcome Measure  Help needed turning from your back to your side while in a flat bed without using bedrails?: A Little Help needed moving from lying on your back to sitting on the side of a flat bed without using bedrails?: A Little Help needed moving to and from a bed to a chair (including a wheelchair)?: A Little Help needed standing up from a chair using your arms (e.g., wheelchair or bedside chair)?: A Little Help needed to walk in hospital room?: A Little Help needed climbing 3-5 steps with a railing? : A Little 6 Click Score: 18    End of Session Equipment Utilized During Treatment: Gait belt Activity Tolerance: Patient tolerated treatment well Patient left: in chair;with call bell/phone within reach;with family/visitor present Nurse Communication: Mobility status;Other (comment) (Ok for discharge from PT perspective) PT Visit Diagnosis: Other abnormalities of gait and mobility (R26.89);Muscle weakness (generalized) (M62.81);Pain Pain - Right/Left: Right Pain - part of body: Hip     Time: 9151-9069 PT Time Calculation (min) (ACUTE ONLY): 42 min  Charges:    $Gait Training: 38-52 mins PT General Charges $$ ACUTE PT VISIT: 1 Visit                      Theresa Nolan, PT Acute Rehabilitation Services  Office [phone]    Theresa SHAUNNA Soja 02/17/2024, 9:45 AM

## 2024-02-24 ENCOUNTER — Encounter: Admitting: Orthopaedic Surgery

## 2024-02-24 ENCOUNTER — Other Ambulatory Visit (HOSPITAL_COMMUNITY): Payer: Self-pay

## 2024-02-24 MED ORDER — OMEPRAZOLE 20 MG PO CPDR
20.0000 mg | DELAYED_RELEASE_CAPSULE | Freq: Two times a day (BID) | ORAL | 0 refills | Status: AC
  Filled 2024-02-24: qty 60, 30d supply, fill #0

## 2024-02-25 ENCOUNTER — Other Ambulatory Visit (HOSPITAL_COMMUNITY): Payer: Self-pay

## 2024-02-28 ENCOUNTER — Encounter: Admitting: Orthopaedic Surgery

## 2024-04-18 ENCOUNTER — Ambulatory Visit: Admitting: Physician Assistant
